# Patient Record
Sex: Female | Born: 1950 | Race: White | Hispanic: No | Marital: Married | Smoking: Never smoker
Health system: Southern US, Community
[De-identification: ages and names within clinical notes are randomized; demographics above are authoritative.]

## PROBLEM LIST (undated history)

## (undated) DIAGNOSIS — I10 Essential (primary) hypertension: Secondary | ICD-10-CM

## (undated) DIAGNOSIS — E039 Hypothyroidism, unspecified: Secondary | ICD-10-CM

## (undated) DIAGNOSIS — T7840XA Allergy, unspecified, initial encounter: Secondary | ICD-10-CM

## (undated) DIAGNOSIS — M8580 Other specified disorders of bone density and structure, unspecified site: Secondary | ICD-10-CM

## (undated) DIAGNOSIS — E7800 Pure hypercholesterolemia, unspecified: Secondary | ICD-10-CM

## (undated) DIAGNOSIS — Z98811 Dental restoration status: Secondary | ICD-10-CM

## (undated) DIAGNOSIS — M1990 Unspecified osteoarthritis, unspecified site: Secondary | ICD-10-CM

## (undated) DIAGNOSIS — H269 Unspecified cataract: Secondary | ICD-10-CM

## (undated) DIAGNOSIS — G4730 Sleep apnea, unspecified: Secondary | ICD-10-CM

## (undated) DIAGNOSIS — S63599A Other specified sprain of unspecified wrist, initial encounter: Secondary | ICD-10-CM

## (undated) HISTORY — PX: KNEE ARTHROSCOPY W/ MENISCAL REPAIR: SHX1877

## (undated) HISTORY — DX: Unspecified cataract: H26.9

## (undated) HISTORY — PX: NASAL SEPTUM SURGERY: SHX37

## (undated) HISTORY — PX: TUBAL LIGATION: SHX77

## (undated) HISTORY — PX: JOINT REPLACEMENT: SHX530

## (undated) HISTORY — DX: Other specified disorders of bone density and structure, unspecified site: M85.80

## (undated) HISTORY — DX: Sleep apnea, unspecified: G47.30

## (undated) HISTORY — DX: Allergy, unspecified, initial encounter: T78.40XA

---

## 1998-05-22 ENCOUNTER — Ambulatory Visit (HOSPITAL_COMMUNITY): Admission: RE | Admit: 1998-05-22 | Discharge: 1998-05-22 | Payer: Self-pay | Admitting: Urology

## 1998-05-22 ENCOUNTER — Encounter: Payer: Self-pay | Admitting: Urology

## 1998-05-24 ENCOUNTER — Emergency Department (HOSPITAL_COMMUNITY): Admission: EM | Admit: 1998-05-24 | Discharge: 1998-05-24 | Payer: Self-pay | Admitting: Emergency Medicine

## 1999-12-03 ENCOUNTER — Encounter: Admission: RE | Admit: 1999-12-03 | Discharge: 1999-12-03 | Payer: Self-pay | Admitting: Obstetrics and Gynecology

## 1999-12-03 ENCOUNTER — Encounter: Payer: Self-pay | Admitting: Obstetrics and Gynecology

## 1999-12-08 ENCOUNTER — Encounter: Admission: RE | Admit: 1999-12-08 | Discharge: 1999-12-08 | Payer: Self-pay | Admitting: Obstetrics and Gynecology

## 1999-12-08 ENCOUNTER — Encounter: Payer: Self-pay | Admitting: Obstetrics and Gynecology

## 2000-12-09 ENCOUNTER — Encounter: Admission: RE | Admit: 2000-12-09 | Discharge: 2000-12-09 | Payer: Self-pay | Admitting: Obstetrics and Gynecology

## 2000-12-09 ENCOUNTER — Encounter: Payer: Self-pay | Admitting: Obstetrics and Gynecology

## 2002-01-01 ENCOUNTER — Encounter: Payer: Self-pay | Admitting: Obstetrics and Gynecology

## 2002-01-01 ENCOUNTER — Encounter: Admission: RE | Admit: 2002-01-01 | Discharge: 2002-01-01 | Payer: Self-pay | Admitting: Obstetrics and Gynecology

## 2003-01-08 ENCOUNTER — Encounter: Admission: RE | Admit: 2003-01-08 | Discharge: 2003-01-08 | Payer: Self-pay | Admitting: Obstetrics and Gynecology

## 2003-01-08 ENCOUNTER — Encounter: Payer: Self-pay | Admitting: Obstetrics and Gynecology

## 2004-01-22 ENCOUNTER — Encounter: Admission: RE | Admit: 2004-01-22 | Discharge: 2004-01-22 | Payer: Self-pay | Admitting: Obstetrics and Gynecology

## 2005-03-15 ENCOUNTER — Encounter: Admission: RE | Admit: 2005-03-15 | Discharge: 2005-03-15 | Payer: Self-pay | Admitting: Internal Medicine

## 2005-09-08 ENCOUNTER — Encounter: Payer: Self-pay | Admitting: Emergency Medicine

## 2006-03-22 ENCOUNTER — Encounter: Admission: RE | Admit: 2006-03-22 | Discharge: 2006-03-22 | Payer: Self-pay | Admitting: Internal Medicine

## 2007-03-24 ENCOUNTER — Encounter: Admission: RE | Admit: 2007-03-24 | Discharge: 2007-03-24 | Payer: Self-pay | Admitting: Internal Medicine

## 2008-03-25 ENCOUNTER — Encounter: Admission: RE | Admit: 2008-03-25 | Discharge: 2008-03-25 | Payer: Self-pay | Admitting: Internal Medicine

## 2009-03-26 ENCOUNTER — Encounter: Admission: RE | Admit: 2009-03-26 | Discharge: 2009-03-26 | Payer: Self-pay | Admitting: Internal Medicine

## 2009-06-25 ENCOUNTER — Ambulatory Visit (HOSPITAL_COMMUNITY): Admission: RE | Admit: 2009-06-25 | Discharge: 2009-06-25 | Payer: Self-pay | Admitting: Internal Medicine

## 2010-04-13 ENCOUNTER — Encounter: Admission: RE | Admit: 2010-04-13 | Discharge: 2010-04-13 | Payer: Self-pay | Admitting: Internal Medicine

## 2010-09-13 ENCOUNTER — Ambulatory Visit (HOSPITAL_BASED_OUTPATIENT_CLINIC_OR_DEPARTMENT_OTHER): Payer: BC Managed Care – PPO | Attending: Otolaryngology

## 2010-09-13 DIAGNOSIS — R0989 Other specified symptoms and signs involving the circulatory and respiratory systems: Secondary | ICD-10-CM | POA: Insufficient documentation

## 2010-09-13 DIAGNOSIS — R259 Unspecified abnormal involuntary movements: Secondary | ICD-10-CM | POA: Insufficient documentation

## 2010-09-13 DIAGNOSIS — G4710 Hypersomnia, unspecified: Secondary | ICD-10-CM | POA: Insufficient documentation

## 2010-09-13 DIAGNOSIS — R0609 Other forms of dyspnea: Secondary | ICD-10-CM | POA: Insufficient documentation

## 2010-09-19 DIAGNOSIS — G4710 Hypersomnia, unspecified: Secondary | ICD-10-CM

## 2010-09-19 DIAGNOSIS — R0609 Other forms of dyspnea: Secondary | ICD-10-CM

## 2010-09-19 DIAGNOSIS — R259 Unspecified abnormal involuntary movements: Secondary | ICD-10-CM

## 2010-09-19 DIAGNOSIS — G4730 Sleep apnea, unspecified: Secondary | ICD-10-CM

## 2010-09-19 DIAGNOSIS — R0989 Other specified symptoms and signs involving the circulatory and respiratory systems: Secondary | ICD-10-CM

## 2010-09-19 NOTE — Procedures (Signed)
NAMEWYNONNA, FITZHENRY                  ACCOUNT NO.:  [id number]  MEDICAL RECORD NO.:  [id number]          PATIENT TYPE:  OUT  LOCATION:  SLEEP CENTER                 FACILITY:  [facility]  PHYSICIAN:  Prairie Stenberg D. Maple Hudson, MD, FCCP, FACPDATE OF BIRTH:  Mar 23[birth year] 1950  DATE OF STUDY:  09/13/2010                           NOCTURNAL POLYSOMNOGRAM  REFERRING PHYSICIAN:  DAVID L SHOEMAKER  INDICATION FOR STUDY:  Hypersomnia with sleep apnea.  EPWORTH SLEEPINESS SCORE:  13/24, BMI 28.3.  Weight 165 pounds, height 64 inches.  Neck 13 inches.  MEDICATIONS:  Home medications are charted and reviewed.  SLEEP ARCHITECTURE:  Total sleep time 288 minutes with sleep efficiency 70%.  Stage I was 22.2%, stage II 66.8%, stage III 2.1%, REM 8.9% of total sleep time.  Sleep latency 3.5 minutes.  REM latency 272 minutes. Awake after sleep onset 120 minutes.  Arousal index 24.6.  Bedtime Medications:  Flonase, calcium, aspirin.  RESPIRATORY DATA:  Apnea/hypopnea index (AHI) 4 per hour.  A total of 19 events was scored including one obstructive apneas, two central apneas, 16 hypopneas.  Events were non-supine and almost completely associated with REM (REM/AHI 37.6), RDI 20.2 per hour.  There were insufficient numbers of events to permit application of CPAP titration by split protocol on this study night.  OXYGEN DATA:  Moderately loud snoring with oxygen desaturation to a nadir of 86% and a mean oxygen saturation through the study of 95.7% on room air.  CARDIAC DATA:  Sinus rhythm with occasional PVC.  MOVEMENT-PARASOMNIA:  A total of 20 limb jerks were counted of which 6 were associated with arousal or awakening for periodic limb movement with arousal index of 1.3 per hour.  Bathroom x2.  IMPRESSIONS-RECOMMENDATIONS: 1. Sleep was fragmented by several moderately sustained nonspecific     awakenings.  If this is typical of her usual sleep pattern, then     management as insomnia may be helpful. 2. Occasional  respiratory events with sleep disturbance, within normal     limits, AHI 4 per hour (normal range 0-5 per hour).  Events were     exclusively associated with non-supine sleep and with REM and were     clustered during the single REM event around 3 a.m.  Moderately     loud snoring with oxygen desaturation to a nadir of 86% and a mean     oxygen saturation through the study of 95.7% on room air. 3. There were insufficient numbers of respiratory events to trigger     application of CPAP titration by split protocol on this study     night. 4. 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  A total of 20 limb     jerks were associated with 6 arousal events for periodic limb     movement with arousal index of 1.3 per hour.  Sleep was also     disturbed twice for bathroom trips.     Nahome Bublitz D. Maple Hudson, MD, FCCP, FACP Diplomate, Biomedical engineer of Sleep Medicine Electronically Signed    CDY/MEDQ  D:  09/19/2010 10:32:51  T:  09/19/2010 23:24:40  Job:  [id number]

## 2011-03-25 ENCOUNTER — Other Ambulatory Visit: Payer: Self-pay | Admitting: Nurse Practitioner

## 2011-03-25 DIAGNOSIS — Z1231 Encounter for screening mammogram for malignant neoplasm of breast: Secondary | ICD-10-CM

## 2011-04-13 ENCOUNTER — Other Ambulatory Visit: Payer: Self-pay | Admitting: Nurse Practitioner

## 2011-04-13 DIAGNOSIS — Z780 Asymptomatic menopausal state: Secondary | ICD-10-CM

## 2011-04-13 DIAGNOSIS — M8580 Other specified disorders of bone density and structure, unspecified site: Secondary | ICD-10-CM

## 2011-04-19 ENCOUNTER — Ambulatory Visit
Admission: RE | Admit: 2011-04-19 | Discharge: 2011-04-19 | Disposition: A | Discharge status: Home / Self Care | Payer: BC Managed Care – PPO | Source: Ambulatory Visit | Attending: Nurse Practitioner | Admitting: Nurse Practitioner

## 2011-04-19 DIAGNOSIS — Z1231 Encounter for screening mammogram for malignant neoplasm of breast: Secondary | ICD-10-CM

## 2011-05-20 ENCOUNTER — Ambulatory Visit
Admission: RE | Admit: 2011-05-20 | Discharge: 2011-05-20 | Disposition: A | Discharge status: Home / Self Care | Payer: BC Managed Care – PPO | Source: Ambulatory Visit | Attending: Nurse Practitioner | Admitting: Nurse Practitioner

## 2011-05-20 DIAGNOSIS — Z780 Asymptomatic menopausal state: Secondary | ICD-10-CM

## 2011-05-20 DIAGNOSIS — M8580 Other specified disorders of bone density and structure, unspecified site: Secondary | ICD-10-CM

## 2012-03-24 ENCOUNTER — Other Ambulatory Visit: Payer: Self-pay | Admitting: Nurse Practitioner

## 2012-03-24 DIAGNOSIS — Z1231 Encounter for screening mammogram for malignant neoplasm of breast: Secondary | ICD-10-CM

## 2012-05-05 ENCOUNTER — Ambulatory Visit
Admission: RE | Admit: 2012-05-05 | Discharge: 2012-05-05 | Disposition: A | Discharge status: Home / Self Care | Payer: BC Managed Care – PPO | Source: Ambulatory Visit | Attending: Nurse Practitioner | Admitting: Nurse Practitioner

## 2012-05-05 DIAGNOSIS — Z1231 Encounter for screening mammogram for malignant neoplasm of breast: Secondary | ICD-10-CM

## 2013-03-26 ENCOUNTER — Ambulatory Visit (INDEPENDENT_AMBULATORY_CARE_PROVIDER_SITE_OTHER): Payer: BC Managed Care – PPO

## 2013-03-26 ENCOUNTER — Ambulatory Visit (INDEPENDENT_AMBULATORY_CARE_PROVIDER_SITE_OTHER): Payer: BC Managed Care – PPO | Admitting: Podiatry

## 2013-03-26 ENCOUNTER — Encounter: Payer: Self-pay | Admitting: Podiatry

## 2013-03-26 VITALS — BP 165/78 | HR 88 | Resp 12 | Ht 64.0 in | Wt 178.0 lb

## 2013-03-26 DIAGNOSIS — M79609 Pain in unspecified limb: Secondary | ICD-10-CM

## 2013-03-26 DIAGNOSIS — M79671 Pain in right foot: Secondary | ICD-10-CM

## 2013-03-26 DIAGNOSIS — M722 Plantar fascial fibromatosis: Secondary | ICD-10-CM

## 2013-03-26 MED ORDER — IBUPROFEN 800 MG PO TABS: 800.0000 mg | ORAL_TABLET | Freq: Three times a day (TID) | ORAL | Status: DC | PRN

## 2013-03-26 MED ORDER — TRIAMCINOLONE ACETONIDE 10 MG/ML IJ SUSP
10.0000 mg | Freq: Once | INTRAMUSCULAR | Status: AC
  Administered 2013-03-26: 10 mg

## 2013-03-26 NOTE — Progress Notes (Signed)
  Subjective:    Patient ID: Jean Baldwin, female    DOB: Jul 22, 1950, 62 y.o.   MRN: [id number]  HPI Comments: '' RT FOOT BOTTOM OF THE HEEL IS SORE''  She has tried over-the-counter NSAIDs shoe changes and wearing her existing hardware orthotics in the symptoms are persisting. She relates having right heel pain for many years ago and has been existing rigid orthotic which he is attempted to wear hours uncomfortable because she says it's too hard.   Review of Systems  Musculoskeletal: Positive for gait problem and joint swelling.  All other systems reviewed and are negative.       Objective:   Physical Exam Orientated x50 62 year old white female.  Vascular: The DP and PT pulses are two over four bilaterally.  Neurological: Knee and ankle reflexes are equal and reactive bilaterally  Dermatological: Texture and turgor within normal limits  Musculoskeletal: High arch contour noted with a antalgia gait favoring the right foot. The medial plantar right heel is tender to palpation without a palpable lesions. This area duplicates her area of discomfort.        Assessment & Plan:   Assessment: Plantar fasciitis right  Plan: The skin is prepped with alcohol and Betadine and 10 mg of Kenalog mixed with 10 mg of plain Xylocaine and 2.5 mg of plain Marcaine injected inferior right heel for Kenalog injection #1.  Shoes and stretching discussed. DC over-the-counter NSAIDs and begin ibuprofen 800 mg #90, tid. She will attempt to wear orthotics in the future as tolerated. Reappoint at patient's request.

## 2013-03-26 NOTE — Patient Instructions (Signed)

## 2013-04-12 ENCOUNTER — Other Ambulatory Visit: Payer: Self-pay

## 2013-04-12 DIAGNOSIS — Z1231 Encounter for screening mammogram for malignant neoplasm of breast: Secondary | ICD-10-CM

## 2013-05-11 ENCOUNTER — Ambulatory Visit
Admission: RE | Admit: 2013-05-11 | Discharge: 2013-05-11 | Disposition: A | Discharge status: Home / Self Care | Payer: BC Managed Care – PPO | Source: Ambulatory Visit

## 2013-05-11 ENCOUNTER — Ambulatory Visit: Payer: BC Managed Care – PPO

## 2013-05-11 DIAGNOSIS — Z1231 Encounter for screening mammogram for malignant neoplasm of breast: Secondary | ICD-10-CM

## 2014-04-17 ENCOUNTER — Other Ambulatory Visit: Payer: Self-pay

## 2014-04-17 DIAGNOSIS — Z1231 Encounter for screening mammogram for malignant neoplasm of breast: Secondary | ICD-10-CM

## 2014-05-13 ENCOUNTER — Ambulatory Visit: Payer: BC Managed Care – PPO

## 2014-05-15 ENCOUNTER — Other Ambulatory Visit: Payer: Self-pay

## 2014-05-15 ENCOUNTER — Other Ambulatory Visit: Payer: Self-pay | Admitting: Nurse Practitioner

## 2014-05-15 DIAGNOSIS — Z780 Asymptomatic menopausal state: Secondary | ICD-10-CM

## 2014-05-15 DIAGNOSIS — Z1231 Encounter for screening mammogram for malignant neoplasm of breast: Secondary | ICD-10-CM

## 2014-05-22 ENCOUNTER — Ambulatory Visit
Admission: RE | Admit: 2014-05-22 | Discharge: 2014-05-22 | Disposition: A | Discharge status: Home / Self Care | Payer: BC Managed Care – PPO | Source: Ambulatory Visit

## 2014-05-22 ENCOUNTER — Ambulatory Visit
Admission: RE | Admit: 2014-05-22 | Discharge: 2014-05-22 | Disposition: A | Discharge status: Home / Self Care | Payer: BC Managed Care – PPO | Source: Ambulatory Visit | Attending: Nurse Practitioner | Admitting: Nurse Practitioner

## 2014-05-22 DIAGNOSIS — Z780 Asymptomatic menopausal state: Secondary | ICD-10-CM

## 2014-05-22 DIAGNOSIS — Z1231 Encounter for screening mammogram for malignant neoplasm of breast: Secondary | ICD-10-CM

## 2014-05-24 ENCOUNTER — Other Ambulatory Visit: Payer: Self-pay | Admitting: Nurse Practitioner

## 2014-05-24 DIAGNOSIS — R928 Other abnormal and inconclusive findings on diagnostic imaging of breast: Secondary | ICD-10-CM

## 2014-06-05 ENCOUNTER — Ambulatory Visit
Admission: RE | Admit: 2014-06-05 | Discharge: 2014-06-05 | Disposition: A | Discharge status: Home / Self Care | Payer: BC Managed Care – PPO | Source: Ambulatory Visit | Attending: Nurse Practitioner | Admitting: Nurse Practitioner

## 2014-06-05 DIAGNOSIS — R928 Other abnormal and inconclusive findings on diagnostic imaging of breast: Secondary | ICD-10-CM

## 2014-09-24 ENCOUNTER — Other Ambulatory Visit: Payer: Self-pay | Admitting: Nurse Practitioner

## 2014-09-24 ENCOUNTER — Other Ambulatory Visit (HOSPITAL_COMMUNITY): Payer: Self-pay | Admitting: Nurse Practitioner

## 2014-09-24 DIAGNOSIS — R0602 Shortness of breath: Secondary | ICD-10-CM

## 2014-10-17 ENCOUNTER — Telehealth (HOSPITAL_COMMUNITY): Payer: Self-pay

## 2014-10-17 NOTE — Telephone Encounter (Signed)
Encounter complete. 

## 2014-10-18 ENCOUNTER — Telehealth (HOSPITAL_COMMUNITY): Payer: Self-pay

## 2014-10-18 NOTE — Telephone Encounter (Signed)
Encounter complete. 

## 2014-10-22 ENCOUNTER — Ambulatory Visit (HOSPITAL_COMMUNITY)
Admission: RE | Admit: 2014-10-22 | Discharge: 2014-10-22 | Disposition: A | Discharge status: Home / Self Care | Payer: BC Managed Care – PPO | Source: Ambulatory Visit | Attending: Internal Medicine | Admitting: Internal Medicine

## 2014-10-22 DIAGNOSIS — R0602 Shortness of breath: Secondary | ICD-10-CM | POA: Insufficient documentation

## 2014-10-22 LAB — EXERCISE TOLERANCE TEST
CHL RATE OF PERCEIVED EXERTION: 15
CSEPED: 7 min
Estimated workload: 8.5 METS
MPHR: 156 {beats}/min
Peak HR: 162 {beats}/min
Percent HR: 103 %
Rest HR: 91 {beats}/min

## 2015-04-29 ENCOUNTER — Ambulatory Visit
Admission: RE | Admit: 2015-04-29 | Discharge: 2015-04-29 | Disposition: A | Discharge status: Home / Self Care | Payer: BC Managed Care – PPO | Source: Ambulatory Visit | Attending: Nurse Practitioner | Admitting: Nurse Practitioner

## 2015-04-29 ENCOUNTER — Other Ambulatory Visit: Payer: Self-pay | Admitting: Nurse Practitioner

## 2015-04-29 DIAGNOSIS — M25562 Pain in left knee: Secondary | ICD-10-CM

## 2015-04-29 DIAGNOSIS — M25511 Pain in right shoulder: Secondary | ICD-10-CM

## 2015-05-16 ENCOUNTER — Other Ambulatory Visit: Payer: Self-pay | Admitting: Nurse Practitioner

## 2015-05-16 ENCOUNTER — Other Ambulatory Visit: Payer: Self-pay

## 2015-05-16 DIAGNOSIS — Z1231 Encounter for screening mammogram for malignant neoplasm of breast: Secondary | ICD-10-CM

## 2015-06-05 ENCOUNTER — Ambulatory Visit
Admission: RE | Admit: 2015-06-05 | Discharge: 2015-06-05 | Disposition: A | Discharge status: Home / Self Care | Payer: BC Managed Care – PPO | Source: Ambulatory Visit

## 2015-06-05 DIAGNOSIS — Z1231 Encounter for screening mammogram for malignant neoplasm of breast: Secondary | ICD-10-CM

## 2016-04-19 ENCOUNTER — Other Ambulatory Visit: Payer: Self-pay | Admitting: Family Medicine

## 2016-04-19 DIAGNOSIS — E2839 Other primary ovarian failure: Secondary | ICD-10-CM

## 2016-04-29 ENCOUNTER — Ambulatory Visit
Admission: RE | Admit: 2016-04-29 | Discharge: 2016-04-29 | Disposition: A | Discharge status: Home / Self Care | Payer: BC Managed Care – PPO | Source: Ambulatory Visit | Attending: Family Medicine | Admitting: Family Medicine

## 2016-04-29 DIAGNOSIS — E2839 Other primary ovarian failure: Secondary | ICD-10-CM

## 2016-05-11 ENCOUNTER — Other Ambulatory Visit: Payer: Self-pay | Admitting: Family Medicine

## 2016-05-11 DIAGNOSIS — Z1231 Encounter for screening mammogram for malignant neoplasm of breast: Secondary | ICD-10-CM

## 2016-06-07 ENCOUNTER — Ambulatory Visit
Admission: RE | Admit: 2016-06-07 | Discharge: 2016-06-07 | Disposition: A | Discharge status: Home / Self Care | Payer: BC Managed Care – PPO | Source: Ambulatory Visit | Attending: Family Medicine | Admitting: Family Medicine

## 2016-06-07 DIAGNOSIS — Z1231 Encounter for screening mammogram for malignant neoplasm of breast: Secondary | ICD-10-CM

## 2016-07-16 ENCOUNTER — Other Ambulatory Visit: Payer: Self-pay | Admitting: Orthopedic Surgery

## 2016-07-16 DIAGNOSIS — S52611K Displaced fracture of right ulna styloid process, subsequent encounter for closed fracture with nonunion: Secondary | ICD-10-CM

## 2016-08-02 ENCOUNTER — Ambulatory Visit
Admission: RE | Admit: 2016-08-02 | Discharge: 2016-08-02 | Disposition: A | Discharge status: Home / Self Care | Payer: BC Managed Care – PPO | Source: Ambulatory Visit | Attending: Orthopedic Surgery | Admitting: Orthopedic Surgery

## 2016-08-02 DIAGNOSIS — S52611K Displaced fracture of right ulna styloid process, subsequent encounter for closed fracture with nonunion: Secondary | ICD-10-CM

## 2016-08-02 MED ORDER — IOPAMIDOL (ISOVUE-M 200) INJECTION 41%
1.5000 mL | Freq: Once | INTRAMUSCULAR | Status: AC
  Administered 2016-08-02: 1.5 mL via INTRA_ARTICULAR

## 2016-08-11 DIAGNOSIS — S52611K Displaced fracture of right ulna styloid process, subsequent encounter for closed fracture with nonunion: Secondary | ICD-10-CM | POA: Insufficient documentation

## 2016-08-11 DIAGNOSIS — S6981XA Other specified injuries of right wrist, hand and finger(s), initial encounter: Secondary | ICD-10-CM | POA: Insufficient documentation

## 2016-08-11 DIAGNOSIS — M1811 Unilateral primary osteoarthritis of first carpometacarpal joint, right hand: Secondary | ICD-10-CM | POA: Insufficient documentation

## 2017-03-04 ENCOUNTER — Other Ambulatory Visit: Payer: Self-pay | Admitting: Orthopedic Surgery

## 2017-03-10 DIAGNOSIS — S63599A Other specified sprain of unspecified wrist, initial encounter: Secondary | ICD-10-CM

## 2017-03-10 HISTORY — DX: Other specified sprain of unspecified wrist, initial encounter: S63.599A

## 2017-04-07 ENCOUNTER — Other Ambulatory Visit: Payer: Self-pay

## 2017-04-07 ENCOUNTER — Encounter (HOSPITAL_BASED_OUTPATIENT_CLINIC_OR_DEPARTMENT_OTHER): Payer: Self-pay | Admitting: *Deleted

## 2017-04-07 ENCOUNTER — Encounter (HOSPITAL_BASED_OUTPATIENT_CLINIC_OR_DEPARTMENT_OTHER)
Admission: RE | Admit: 2017-04-07 | Discharge: 2017-04-07 | Disposition: A | Discharge status: Home / Self Care | Payer: Medicare Other | Source: Ambulatory Visit | Attending: Orthopedic Surgery | Admitting: Orthopedic Surgery

## 2017-04-07 DIAGNOSIS — I10 Essential (primary) hypertension: Secondary | ICD-10-CM | POA: Insufficient documentation

## 2017-04-07 DIAGNOSIS — Z01810 Encounter for preprocedural cardiovascular examination: Secondary | ICD-10-CM | POA: Diagnosis present

## 2017-04-07 NOTE — Pre-Procedure Instructions (Signed)
To come for EKG 

## 2017-04-12 ENCOUNTER — Encounter (HOSPITAL_BASED_OUTPATIENT_CLINIC_OR_DEPARTMENT_OTHER)
Admission: RE | Disposition: A | Discharge status: Home / Self Care | Payer: Self-pay | Source: Ambulatory Visit | Attending: Orthopedic Surgery

## 2017-04-12 ENCOUNTER — Ambulatory Visit (HOSPITAL_BASED_OUTPATIENT_CLINIC_OR_DEPARTMENT_OTHER)
Admission: RE | Admit: 2017-04-12 | Discharge: 2017-04-12 | Disposition: A | Discharge status: Home / Self Care | Payer: Medicare Other | Source: Ambulatory Visit | Attending: Orthopedic Surgery | Admitting: Orthopedic Surgery

## 2017-04-12 ENCOUNTER — Ambulatory Visit (HOSPITAL_BASED_OUTPATIENT_CLINIC_OR_DEPARTMENT_OTHER): Payer: Medicare Other | Admitting: Anesthesiology

## 2017-04-12 ENCOUNTER — Other Ambulatory Visit: Payer: Self-pay

## 2017-04-12 ENCOUNTER — Encounter (HOSPITAL_BASED_OUTPATIENT_CLINIC_OR_DEPARTMENT_OTHER): Payer: Self-pay | Admitting: *Deleted

## 2017-04-12 DIAGNOSIS — X58XXXA Exposure to other specified factors, initial encounter: Secondary | ICD-10-CM | POA: Insufficient documentation

## 2017-04-12 DIAGNOSIS — E039 Hypothyroidism, unspecified: Secondary | ICD-10-CM | POA: Insufficient documentation

## 2017-04-12 DIAGNOSIS — Z882 Allergy status to sulfonamides status: Secondary | ICD-10-CM | POA: Insufficient documentation

## 2017-04-12 DIAGNOSIS — I10 Essential (primary) hypertension: Secondary | ICD-10-CM | POA: Diagnosis not present

## 2017-04-12 DIAGNOSIS — Z7982 Long term (current) use of aspirin: Secondary | ICD-10-CM | POA: Diagnosis not present

## 2017-04-12 DIAGNOSIS — M170 Bilateral primary osteoarthritis of knee: Secondary | ICD-10-CM | POA: Insufficient documentation

## 2017-04-12 DIAGNOSIS — Z885 Allergy status to narcotic agent status: Secondary | ICD-10-CM | POA: Insufficient documentation

## 2017-04-12 DIAGNOSIS — M1811 Unilateral primary osteoarthritis of first carpometacarpal joint, right hand: Secondary | ICD-10-CM | POA: Diagnosis not present

## 2017-04-12 DIAGNOSIS — E7800 Pure hypercholesterolemia, unspecified: Secondary | ICD-10-CM | POA: Insufficient documentation

## 2017-04-12 DIAGNOSIS — S6981XA Other specified injuries of right wrist, hand and finger(s), initial encounter: Secondary | ICD-10-CM | POA: Insufficient documentation

## 2017-04-12 DIAGNOSIS — Z79899 Other long term (current) drug therapy: Secondary | ICD-10-CM | POA: Diagnosis not present

## 2017-04-12 HISTORY — PX: WRIST ARTHROSCOPY WITH DEBRIDEMENT: SHX6194

## 2017-04-12 HISTORY — DX: Unspecified osteoarthritis, unspecified site: M19.90

## 2017-04-12 HISTORY — DX: Hypothyroidism, unspecified: E03.9

## 2017-04-12 HISTORY — DX: Pure hypercholesterolemia, unspecified: E78.00

## 2017-04-12 HISTORY — DX: Essential (primary) hypertension: I10

## 2017-04-12 HISTORY — DX: Dental restoration status: Z98.811

## 2017-04-12 HISTORY — DX: Other specified sprain of unspecified wrist, initial encounter: S63.599A

## 2017-04-12 LAB — POCT HEMOGLOBIN-HEMACUE: Hemoglobin: 11.9 g/dL — ABNORMAL LOW (ref 12.0–15.0)

## 2017-04-12 SURGERY — WRIST ARTHROSCOPY WITH DEBRIDEMENT
Anesthesia: General | Site: Wrist | Laterality: Right

## 2017-04-12 MED ORDER — DEXAMETHASONE SODIUM PHOSPHATE 10 MG/ML IJ SOLN
INTRAMUSCULAR | Status: DC | PRN
  Administered 2017-04-12: 10 mg via INTRAVENOUS

## 2017-04-12 MED ORDER — PROPOFOL 10 MG/ML IV BOLUS
INTRAVENOUS | Status: AC
  Filled 2017-04-12: qty 20

## 2017-04-12 MED ORDER — EPHEDRINE 5 MG/ML INJ
INTRAVENOUS | Status: AC
  Filled 2017-04-12: qty 10

## 2017-04-12 MED ORDER — CEFAZOLIN SODIUM-DEXTROSE 2-4 GM/100ML-% IV SOLN
2.0000 g | INTRAVENOUS | Status: AC
  Administered 2017-04-12: 2 g via INTRAVENOUS

## 2017-04-12 MED ORDER — MIDAZOLAM HCL 2 MG/2ML IJ SOLN
1.0000 mg | INTRAMUSCULAR | Status: DC | PRN
  Administered 2017-04-12 (×2): 1 mg via INTRAVENOUS

## 2017-04-12 MED ORDER — MIDAZOLAM HCL 2 MG/2ML IJ SOLN
INTRAMUSCULAR | Status: AC
  Filled 2017-04-12: qty 2

## 2017-04-12 MED ORDER — LACTATED RINGERS IV SOLN
INTRAVENOUS | Status: DC
  Administered 2017-04-12 (×2): via INTRAVENOUS

## 2017-04-12 MED ORDER — SUCCINYLCHOLINE CHLORIDE 200 MG/10ML IV SOSY
PREFILLED_SYRINGE | INTRAVENOUS | Status: AC
  Filled 2017-04-12: qty 10

## 2017-04-12 MED ORDER — SCOPOLAMINE 1 MG/3DAYS TD PT72
MEDICATED_PATCH | TRANSDERMAL | Status: AC
  Filled 2017-04-12: qty 1

## 2017-04-12 MED ORDER — FENTANYL CITRATE (PF) 100 MCG/2ML IJ SOLN
INTRAMUSCULAR | Status: AC
  Filled 2017-04-12: qty 2

## 2017-04-12 MED ORDER — DEXAMETHASONE SODIUM PHOSPHATE 10 MG/ML IJ SOLN
INTRAMUSCULAR | Status: AC
  Filled 2017-04-12: qty 1

## 2017-04-12 MED ORDER — PROMETHAZINE HCL 25 MG/ML IJ SOLN: 6.2500 mg | INTRAMUSCULAR | Status: DC | PRN

## 2017-04-12 MED ORDER — FENTANYL CITRATE (PF) 100 MCG/2ML IJ SOLN: 25.0000 ug | INTRAMUSCULAR | Status: DC | PRN

## 2017-04-12 MED ORDER — FENTANYL CITRATE (PF) 100 MCG/2ML IJ SOLN
50.0000 ug | INTRAMUSCULAR | Status: AC | PRN
  Administered 2017-04-12 (×2): 25 ug via INTRAVENOUS
  Administered 2017-04-12 (×2): 50 ug via INTRAVENOUS

## 2017-04-12 MED ORDER — SCOPOLAMINE 1 MG/3DAYS TD PT72
1.0000 | MEDICATED_PATCH | Freq: Once | TRANSDERMAL | Status: DC | PRN
  Administered 2017-04-12: 1.5 mg via TRANSDERMAL

## 2017-04-12 MED ORDER — PROPOFOL 10 MG/ML IV BOLUS
INTRAVENOUS | Status: DC | PRN
  Administered 2017-04-12: 200 mg via INTRAVENOUS

## 2017-04-12 MED ORDER — CEFAZOLIN SODIUM-DEXTROSE 2-4 GM/100ML-% IV SOLN
INTRAVENOUS | Status: AC
  Filled 2017-04-12: qty 100

## 2017-04-12 MED ORDER — ONDANSETRON HCL 4 MG/2ML IJ SOLN
INTRAMUSCULAR | Status: DC | PRN
  Administered 2017-04-12: 4 mg via INTRAVENOUS

## 2017-04-12 MED ORDER — FENTANYL CITRATE (PF) 100 MCG/2ML IJ SOLN
INTRAMUSCULAR | Status: AC
Start: 2017-04-12 — End: 2017-04-12
  Filled 2017-04-12: qty 2

## 2017-04-12 MED ORDER — PROPOFOL 500 MG/50ML IV EMUL
INTRAVENOUS | Status: AC
  Filled 2017-04-12: qty 50

## 2017-04-12 MED ORDER — ONDANSETRON HCL 4 MG/2ML IJ SOLN
INTRAMUSCULAR | Status: AC
  Filled 2017-04-12: qty 2

## 2017-04-12 MED ORDER — CHLORHEXIDINE GLUCONATE 4 % EX LIQD: 60.0000 mL | Freq: Once | CUTANEOUS | Status: DC

## 2017-04-12 MED ORDER — TRAMADOL HCL 50 MG PO TABS: 50.0000 mg | ORAL_TABLET | Freq: Four times a day (QID) | ORAL | 0 refills | Status: DC | PRN

## 2017-04-12 MED ORDER — BUPIVACAINE-EPINEPHRINE (PF) 0.5% -1:200000 IJ SOLN
INTRAMUSCULAR | Status: DC | PRN
  Administered 2017-04-12: 30 mL via PERINEURAL

## 2017-04-12 MED ORDER — LIDOCAINE 2% (20 MG/ML) 5 ML SYRINGE
INTRAMUSCULAR | Status: AC
  Filled 2017-04-12: qty 5

## 2017-04-12 MED ORDER — PHENYLEPHRINE 40 MCG/ML (10ML) SYRINGE FOR IV PUSH (FOR BLOOD PRESSURE SUPPORT)
PREFILLED_SYRINGE | INTRAVENOUS | Status: AC
  Filled 2017-04-12: qty 10

## 2017-04-12 SURGICAL SUPPLY — 77 items
BLADE CUDA 2.0 (BLADE) IMPLANT
BLADE EAR TYMPAN 2.5 60D BEAV (BLADE) IMPLANT
BLADE MINI RND TIP GREEN BEAV (BLADE) IMPLANT
BLADE SURG 15 STRL LF DISP TIS (BLADE) ×1 IMPLANT
BLADE SURG 15 STRL SS (BLADE) ×2
BNDG CMPR 9X4 STRL LF SNTH (GAUZE/BANDAGES/DRESSINGS)
BNDG COHESIVE 3X5 TAN STRL LF (GAUZE/BANDAGES/DRESSINGS) ×2 IMPLANT
BNDG ESMARK 4X9 LF (GAUZE/BANDAGES/DRESSINGS) IMPLANT
BNDG GAUZE ELAST 4 BULKY (GAUZE/BANDAGES/DRESSINGS) ×2 IMPLANT
BUR CUDA 2.9 (BURR) IMPLANT
BUR FULL RADIUS 2.0 (BURR) ×1 IMPLANT
BUR FULL RADIUS 2.9 (BURR) IMPLANT
BUR GATOR 2.9 (BURR) ×1 IMPLANT
BUR SPHERICAL 2.9 (BURR) IMPLANT
CANISTER SUCT 1200ML W/VALVE (MISCELLANEOUS) ×1 IMPLANT
CHLORAPREP W/TINT 26ML (MISCELLANEOUS) ×2 IMPLANT
CORD BIPOLAR FORCEPS 12FT (ELECTRODE) IMPLANT
COVER BACK TABLE 60X90IN (DRAPES) ×2 IMPLANT
COVER MAYO STAND STRL (DRAPES) ×2 IMPLANT
CUFF TOURNIQUET SINGLE 18IN (TOURNIQUET CUFF) ×1 IMPLANT
DRAPE EXTREMITY T 121X128X90 (DRAPE) ×2 IMPLANT
DRAPE IMP U-DRAPE 54X76 (DRAPES) ×2 IMPLANT
DRAPE OEC MINIVIEW 54X84 (DRAPES) IMPLANT
DRAPE SURG 17X23 STRL (DRAPES) ×2 IMPLANT
ELECT SMALL JOINT 90D BASC (ELECTRODE) IMPLANT
GAUZE SPONGE 4X4 12PLY STRL (GAUZE/BANDAGES/DRESSINGS) ×2 IMPLANT
GAUZE XEROFORM 1X8 LF (GAUZE/BANDAGES/DRESSINGS) ×2 IMPLANT
GLOVE BIO SURGEON STRL SZ 6.5 (GLOVE) ×1 IMPLANT
GLOVE BIOGEL PI IND STRL 7.0 (GLOVE) IMPLANT
GLOVE BIOGEL PI IND STRL 8.5 (GLOVE) ×1 IMPLANT
GLOVE BIOGEL PI INDICATOR 7.0 (GLOVE) ×2
GLOVE BIOGEL PI INDICATOR 8.5 (GLOVE) ×1
GLOVE SURG ORTHO 8.0 STRL STRW (GLOVE) ×2 IMPLANT
GOWN STRL REUS W/ TWL LRG LVL3 (GOWN DISPOSABLE) ×1 IMPLANT
GOWN STRL REUS W/TWL LRG LVL3 (GOWN DISPOSABLE) ×2
GOWN STRL REUS W/TWL XL LVL3 (GOWN DISPOSABLE) ×2 IMPLANT
IV NS IRRIG 3000ML ARTHROMATIC (IV SOLUTION) ×2 IMPLANT
IV SET EXT 30 76VOL 4 MALE LL (IV SETS) ×2 IMPLANT
MANIFOLD NEPTUNE II (INSTRUMENTS) IMPLANT
NDL EPIDURAL TUOHY 20GX3.5 (NEEDLE) IMPLANT
NDL SAFETY ECLIPSE 18X1.5 (NEEDLE) ×3 IMPLANT
NDL SPNL 18GX3.5 QUINCKE PK (NEEDLE) IMPLANT
NEEDLE HYPO 18GX1.5 SHARP (NEEDLE) ×2
NEEDLE HYPO 22GX1.5 SAFETY (NEEDLE) ×2 IMPLANT
NEEDLE SPNL 18GX3.5 QUINCKE PK (NEEDLE) IMPLANT
NEEDLE TUOHY 20GX3.5 (NEEDLE) IMPLANT
NS IRRIG 1000ML POUR BTL (IV SOLUTION) IMPLANT
PACK BASIN DAY SURGERY FS (CUSTOM PROCEDURE TRAY) ×2 IMPLANT
PAD CAST 3X4 CTTN HI CHSV (CAST SUPPLIES) ×1 IMPLANT
PADDING CAST ABS 3INX4YD NS (CAST SUPPLIES) ×1
PADDING CAST ABS 4INX4YD NS (CAST SUPPLIES) ×1
PADDING CAST ABS COTTON 3X4 (CAST SUPPLIES) ×1 IMPLANT
PADDING CAST ABS COTTON 4X4 ST (CAST SUPPLIES) ×1 IMPLANT
PADDING CAST COTTON 3X4 STRL (CAST SUPPLIES) ×2
ROUTER HOODED VORTEX 2.9MM (BLADE) IMPLANT
SET ARTHROSCOPY TUBING (MISCELLANEOUS) ×2
SET ARTHROSCOPY TUBING LN (MISCELLANEOUS) IMPLANT
SET SM JOINT TUBING/CANN (CANNULA) IMPLANT
SLEEVE SCD COMPRESS KNEE MED (MISCELLANEOUS) ×1 IMPLANT
SPLINT PLASTER CAST XFAST 3X15 (CAST SUPPLIES) IMPLANT
SPLINT PLASTER XTRA FASTSET 3X (CAST SUPPLIES)
STOCKINETTE 4X48 STRL (DRAPES) ×2 IMPLANT
SUCTION FRAZIER HANDLE 10FR (MISCELLANEOUS)
SUCTION TUBE FRAZIER 10FR DISP (MISCELLANEOUS) IMPLANT
SUT ETHILON 4 0 PS 2 18 (SUTURE) ×2 IMPLANT
SUT MERSILENE 4 0 P 3 (SUTURE) IMPLANT
SUT PDS AB 2-0 CT2 27 (SUTURE) IMPLANT
SUT STEEL 4 0 (SUTURE) IMPLANT
SUT VIC AB 2-0 PS2 27 (SUTURE) IMPLANT
SUT VICRYL 4-0 PS2 18IN ABS (SUTURE) IMPLANT
SYR BULB 3OZ (MISCELLANEOUS) ×1 IMPLANT
SYR CONTROL 10ML LL (SYRINGE) ×2 IMPLANT
TOWEL OR NON WOVEN STRL DISP B (DISPOSABLE) ×2 IMPLANT
TUBE CONNECTING 20X1/4 (TUBING) ×1 IMPLANT
UNDERPAD 30X30 (UNDERPADS AND DIAPERS) ×2 IMPLANT
WAND SHORT BEVEL W/CORD (SURGICAL WAND) IMPLANT
WATER STERILE IRR 1000ML POUR (IV SOLUTION) ×2 IMPLANT

## 2017-04-12 NOTE — Anesthesia Procedure Notes (Addendum)
Procedure Name: LMA Insertion Date/Time: 04/12/2017 11:01 AM Performed by: Ronnette HilaPayne, Duc Crocket D, CRNA Pre-anesthesia Checklist: Patient identified, Emergency Drugs available, Suction available and Patient being monitored Patient Re-evaluated:Patient Re-evaluated prior to induction Oxygen Delivery Method: Circle system utilized Preoxygenation: Pre-oxygenation with 100% oxygen Induction Type: IV induction Ventilation: Mask ventilation without difficulty LMA: LMA inserted LMA Size: 4.0 Number of attempts: 1 Airway Equipment and Method: Bite block Placement Confirmation: positive ETCO2 Tube secured with: Tape Dental Injury: Teeth and Oropharynx as per pre-operative assessment

## 2017-04-12 NOTE — Anesthesia Procedure Notes (Signed)
Anesthesia Regional Block: Supraclavicular block   Pre-Anesthetic Checklist: ,, timeout performed, Correct Patient, Correct Site, Correct Laterality, Correct Procedure, Correct Position, site marked, Risks and benefits discussed,  Surgical consent,  Pre-op evaluation,  At surgeon's request and post-op pain management  Laterality: Right  Prep: chloraprep       Needles:  Injection technique: Single-shot  Needle Type: Echogenic Stimulator Needle     Needle Length: 9cm  Needle Gauge: 21     Additional Needles:   Procedures:, nerve stimulator,,, ultrasound used (permanent image in chart),,,,   Nerve Stimulator or Paresthesia:  Response: wrist flexion/ extension, biceps and deltoid, 0.5 mA,   Additional Responses:   Narrative:  Start time: 04/12/2017 10:27 AM End time: 04/12/2017 10:34 AM Injection made incrementally with aspirations every 5 mL.  Performed by: Personally  Anesthesiologist: Marcene DuosFitzgerald, Jaxyn Mestas, MD

## 2017-04-12 NOTE — Anesthesia Preprocedure Evaluation (Signed)
Anesthesia Evaluation  Patient identified by MRN, date of birth, ID band Patient awake    Reviewed: Allergy & Precautions, NPO status , Patient's Chart, lab work & pertinent test results  Airway Mallampati: II  TM Distance: >3 FB Neck ROM: Full    Dental  (+) Dental Advisory Given   Pulmonary neg pulmonary ROS,    breath sounds clear to auscultation       Cardiovascular hypertension, Pt. on medications  Rhythm:Regular Rate:Normal     Neuro/Psych negative neurological ROS     GI/Hepatic negative GI ROS, Neg liver ROS,   Endo/Other  Hypothyroidism   Renal/GU negative Renal ROS     Musculoskeletal  (+) Arthritis ,   Abdominal   Peds  Hematology negative hematology ROS (+)   Anesthesia Other Findings   Reproductive/Obstetrics                             Anesthesia Physical Anesthesia Plan  ASA: II  Anesthesia Plan: General   Post-op Pain Management:  Regional for Post-op pain   Induction: Intravenous  PONV Risk Score and Plan: 3 and Dexamethasone, Ondansetron, Treatment may vary due to age or medical condition and Midazolam  Airway Management Planned: Oral ETT and LMA  Additional Equipment:   Intra-op Plan:   Post-operative Plan: Extubation in OR  Informed Consent: I have reviewed the patients History and Physical, chart, labs and discussed the procedure including the risks, benefits and alternatives for the proposed anesthesia with the patient or authorized representative who has indicated his/her understanding and acceptance.   Dental advisory given  Plan Discussed with: CRNA  Anesthesia Plan Comments:         Anesthesia Quick Evaluation

## 2017-04-12 NOTE — Transfer of Care (Signed)
Immediate Anesthesia Transfer of Care Note  Patient: Jean Baldwin  Procedure(s) Performed: RIGHT WRIST ARTHROSCOPY WITH DEBRIDEMENT (Right Wrist)  Patient Location: PACU  Anesthesia Type:GA combined with regional for post-op pain  Level of Consciousness: awake, alert  and oriented  Airway & Oxygen Therapy: Patient Spontanous Breathing and Patient connected to face mask oxygen  Post-op Assessment: Report given to RN and Post -op Vital signs reviewed and stable  Post vital signs: Reviewed and stable  Last Vitals:  Vitals:   04/12/17 1031 04/12/17 1034  BP:  134/66  Pulse: 76 81  Resp: 18 14  Temp:    SpO2: 100% 100%    Last Pain:  Vitals:   04/12/17 1005  TempSrc: Oral  PainSc: 5       Patients Stated Pain Goal: 4 (04/12/17 1005)  Complications: No apparent anesthesia complications

## 2017-04-12 NOTE — Brief Op Note (Signed)
04/12/2017  12:01 PM  PATIENT:  Jean Baldwin  66 y.o. female  PRE-OPERATIVE DIAGNOSIS:  TRIANGULAR FIBROCARTILAGE COMPLEX TEAR RIGHT WRIST   POST-OPERATIVE DIAGNOSIS:  TRIANGULAR FIBROCARTILAGE COMPLEX TEAR RIGHT WRIST   PROCEDURE:  Procedure(s): RIGHT WRIST ARTHROSCOPY WITH DEBRIDEMENT (Right)  SURGEON:  Surgeon(s) and Role:    * Cindee SaltKuzma, Andrw Mcguirt, MD - Primary  PHYSICIAN ASSISTANT:   ASSISTANTS: none   ANESTHESIA:   regional and general  EBL:  5cc  BLOOD ADMINISTERED:none  DRAINS: none   LOCAL MEDICATIONS USED:  NONE  SPECIMEN:  No Specimen  DISPOSITION OF SPECIMEN:  N/A  COUNTS:  YES  TOURNIQUET:  * Missing tourniquet times found for documented tourniquets in log: [id number] *  DICTATION: .Other Dictation: Dictation Number [id number]  PLAN OF CARE: Discharge to home after PACU  PATIENT DISPOSITION:  PACU - hemodynamically stable.

## 2017-04-12 NOTE — Anesthesia Postprocedure Evaluation (Signed)
Anesthesia Post Note  Patient: Jean Baldwin  Procedure(s) Performed: RIGHT WRIST ARTHROSCOPY WITH DEBRIDEMENT (Right Wrist)     Patient location during evaluation: PACU Anesthesia Type: General Level of consciousness: awake and alert Pain management: pain level controlled Vital Signs Assessment: post-procedure vital signs reviewed and stable Respiratory status: spontaneous breathing, nonlabored ventilation, respiratory function stable and patient connected to nasal cannula oxygen Cardiovascular status: blood pressure returned to baseline and stable Postop Assessment: no apparent nausea or vomiting Anesthetic complications: no    Last Vitals:  Vitals:   04/12/17 1230 04/12/17 1247  BP: (!) 142/63 140/68  Pulse: 81 82  Resp: 14 16  Temp:  36.4 C  SpO2: 93% 95%    Last Pain:  Vitals:   04/12/17 1247  TempSrc: Oral  PainSc: 0-No pain                 Kennieth RadFitzgerald, Isla Sabree E

## 2017-04-12 NOTE — Progress Notes (Signed)
Assisted Dr. Rob Fitzgerald with right, ultrasound guided, supraclavicular block. Side rails up, monitors on throughout procedure. See vital signs in flow sheet. Tolerated Procedure well. 

## 2017-04-12 NOTE — Op Note (Signed)
Jean Baldwin:  Jean Baldwin, Jean Baldwin                 ACCOUNT NO.:  [id number]  MEDICAL RECORD NO.:  [id number]  LOCATION:                                 FACILITY:  PHYSICIAN:  Cindee SaltGary Bari Leib, M.D.            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  OPERATION:  Debridement TFCC, removal of loose styloid fragment, in addition scapholunate lunotriquetral tear; right wrist.  SURGEON:  Cindee SaltGary Lewanna Petrak, MD.  ASSISTANT:  None.  ANESTHESIA:  Supraclavicular block, general.  [facility]  ANESTHESIOLOGISSampson Goon:  Fitzgerald.  HISTORY:  The patient is a 66 year old female with a history of wrist pain primarily to the ulnar side.  She has CMC arthritis, which has not been bothering her to the extent that the wrist is.  This has not responded to conservative treatment.  MRI reveals a significant TFCC tear with an old fracture of her ulnar styloid, degenerative changes on the ulnar aspect of her lunate and ulnar head.  She is admitted for arthroscopy, debridement, inspection, the possibility of further surgery being necessary.  Pre, peri, and postop course have been discussed along with risks and complications to the surgery.  She is aware there is no guarantee to the surgery; the possibility of infection; recurrence of injury to arteries, nerves, tendons; incomplete relief of symptoms; and dystrophy.  In the preoperative area, the patient was seen, the extremity marked by both the patient and surgeon.  Antibiotic given.  DESCRIPTION OF PROCEDURE:  The patient was brought to the operating room after a supraclavicular block was carried out in the preoperative area. General anesthetic was carried out in the  operating room under the anesthesia department.  She was prepped using ChloraPrep in supine position with the right arm free.  A 3-minute dry time was allowed, and a time-out taken, confirming the patient and procedure.  The arm was placed in the arc arthroscopy tower and 10 pounds of traction applied. The joint inflated through the 3-4 portal.  Transverse incision was made, deepened with a hemostat.  A blunt trocar was used to enter the joint.  The joint was inspected.  Very significant changes were present with arthritic changes to the ulnar aspect of the scaphoid, radial aspect of the lunate with a partial scapholunate ligament tear.  The ulnar head was immediately visible ulnarly.  There was a very large styloid fragment, which was relatively loose in the joint.  This showed arthritic changes also.  There was arthritic change and loss of cartilage on the ulnar aspect of the lunate, radial side of the triquetrum.  The lunotriquetral joint had been ruptured.  The triangular fibrocartilage was a T-shaped laceration rupture to it with loose fragments floating in the joint.  A 4-5 portal was opened after localization with a 22-gauge needle.  An 18-gauge needle was used for outflow on the ulnar aspect in 6-U.  A full  radius shaver was then introduced.  A partial debridement of the TFCC was performed, but the thickness of the triangular fibrocartilage complex precluded its continued use.  A gator shaver was introduced.  This also had difficulty resecting the portions of the torn loose TFCC due to thickness.  A basket forceps was introduced, this too was not large enough and a large cutting grasper was inserted removing the portions of the TFCC that were floating in the joint.  These were then removed with a regular grasper by alternating the scope between the two 3-4 and 4-5 portal.  A debridement was then further performed over the basket forceps and the full radius and gator shaver.   Relatively loose area of the styloid was attached to the dorsal ulnar segment of the capsule.  A Kleinert-Kutz elevator was inserted and this was used to manipulate the relatively loose area of the ulnar styloid until it was free.  A knee grasper was then inserted and with manipulation, was ultimately able to debride into the 4-5 portal and remove a loose piece of bone, which measured approximately 0.9 x 1.5 cm.  The midcarpal joint was then inspected through the real ulnar midcarpal portal after localization with a 22- gauge needle.  An 18-gauge needle was used for irrigation, transverse incision made and deepened with a hemostat.  A blunt trocar used to enter the joint.  The joint was inspected.  There were no significant changes on the articular surface of the distal proximal carpal row or the proximal portion of the distal carpal row.  There was no gross instability to the lunotriquetral joint or scapholunate joint.  There was no cartilage damage to the tip of the hamate.  The instruments were removed.  The portals closed with interrupted 4-0 nylon sutures. Sterile compressive dressing and volar splint was applied.  Tourniquet was not used during the procedure.  The patient tolerated the procedure well and was taken to the recovery room for observation in satisfactory condition.  She will be discharged to home [facility].          ______________________________ Cindee SaltGary Delesha Pohlman, M.D.     GK/MEDQ  D:  04/12/2017  T:  04/12/2017  Job:  [id number]

## 2017-04-12 NOTE — H&P (Signed)
Jean Baldwin is an 66 y.o. female.   Chief Complaint: right wrist painHPI: Jean Baldwin is a 66 year old right-hand-dominant female referred by Dr. Hyman HopesWebb for consultation regarding swelling of her right wrist this on the ulnar aspect. This been going on for the past 8-10 weeks. She recalls no history of injury. She states that this is constant dull ache with a VAS score 6/10. Ice helps she was placed on a steroid Dosepak which gave her some relief. She has also been wearing a brace. She states however that it irritates the skin over the area that is mildly swollen. She states doing yard work especially twisting seems to aggravate this for her. She recalls no history of past injury. She has a history of arthritis and thyroid problems no history of diabetes or gout. Family history is positive for gout negative for diabetes thyroid problems and arthritis. He does not have any radiation of the discomfort. And localized to this directly over the ulnar head on the ulnar aspect. Her MR reveals tears with multiple ligaments along with triangular fibrocartilage complex without probable ulnocarpal abutment. She has had injections both CMC joint and to the wrist. These have given her some relief but not complete. She continues to complain of a nagging type pain with a VAS score for over 10. Localizes primarily in the ulnar side of her right wrist. She has not complained to the greater greater extent to the [facility] at the present time. She has not complained of any numbness or tingling. She is taking meloxicam for this. She occasionally takes a Tylenol arthritis in addition. She states opening jars twisting her wrist increase her pain. She has recently retired and this has helped. But she continues to complain of a nagging discomfort.              Past Medical History:  Diagnosis Date  . Arthritis    knees  . Dental crowns present   . High cholesterol   . Hypertension    states under control with meds., has been  on med > 20 yr.  . Hypothyroidism   . TFCC (triangular fibrocartilage complex) tear 03/2017   right    Past Surgical History:  Procedure Laterality Date  . KNEE ARTHROSCOPY W/ MENISCAL REPAIR Left   . NASAL SEPTUM SURGERY    . TUBAL LIGATION      History reviewed. No pertinent family history. Social History:  reports that  has never smoked. she has never used smokeless tobacco. She reports that she does not drink alcohol or use drugs.  Allergies:  Allergies  Allergen Reactions  . Codeine Nausea And Vomiting  . Sulfa Antibiotics Rash    Medications Prior to Admission  Medication Sig Dispense Refill  . amLODipine (NORVASC) 5 MG tablet Take 5 mg by mouth daily.    Marland Kitchen. aspirin 81 MG tablet Take 81 mg by mouth daily.    Marland Kitchen. atorvastatin (LIPITOR) 20 MG tablet Take 20 mg by mouth daily.    . calcium carbonate (OSCAL) 1500 (600 Ca) MG TABS tablet Take 600 mg by mouth daily.    Marland Kitchen. levothyroxine (SYNTHROID, LEVOTHROID) 112 MCG tablet Take 112 mcg by mouth daily before breakfast.    . losartan (COZAAR) 100 MG tablet Take 100 mg by mouth daily.     . meloxicam (MOBIC) 15 MG tablet Take 15 mg by mouth daily.    . Multiple Vitamin (MULTIVITAMIN) tablet Take 1 tablet by mouth daily.    . niacin (NIASPAN)  500 MG CR tablet     . vitamin C (ASCORBIC ACID) 500 MG tablet Take 500 mg by mouth daily.      No results found for this or any previous visit (from the past 48 hour(s)).  No results found.   Pertinent items are noted in HPI.  Height 5' 3.5" (1.613 m), weight 81.6 kg (180 lb).  General appearance: alert, cooperative and appears stated age Head: Normocephalic, without obvious abnormality Neck: no JVD Resp: clear to auscultation bilaterally Cardio: regular rate and rhythm, S1, S2 normal, no murmur, click, rub or gallop GI: soft, non-tender; bowel sounds normal; no masses,  no organomegaly Extremities: right wrist pain Pulses: 2+ and symmetric Skin: Skin color, texture, turgor  normal. No rashes or lesions Neurologic: Grossly normal Incision/Wound: na  Assessment/Plan Assessment:  1. Primary osteoarthritis of first carpometacarpal joint of right hand  2. 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 Plan will be debridement as dictated by findings of TFCC LT tear is. She does not appear to have an ulnar carpal abutment. If she does then we would not recommend shortening at that time. She is not interested in a large operation at the present time. She is hoping to see if this will help decrease some of her symptoms for her. She is aware that there is no guarantee to the surgery the possibility of infection recurrence injury to arteries nerves tendons incomplete relief symptoms dystrophy. She is advised this will not affect her thumb. She is scheduled for arthroscopic inspection debridement right wrist and outpatient under regional anesthesia.      Jean Baldwin 04/12/2017, 9:45 AM

## 2017-04-12 NOTE — Discharge Instructions (Addendum)

## 2017-04-12 NOTE — Op Note (Signed)
Dictation Number [id number]

## 2017-04-13 ENCOUNTER — Encounter (HOSPITAL_BASED_OUTPATIENT_CLINIC_OR_DEPARTMENT_OTHER): Payer: Self-pay | Admitting: Orthopedic Surgery

## 2017-04-26 ENCOUNTER — Other Ambulatory Visit: Payer: Self-pay | Admitting: Family Medicine

## 2017-04-26 ENCOUNTER — Ambulatory Visit
Admission: RE | Admit: 2017-04-26 | Discharge: 2017-04-26 | Disposition: A | Discharge status: Home / Self Care | Payer: Medicare Other | Source: Ambulatory Visit | Attending: Family Medicine | Admitting: Family Medicine

## 2017-04-26 DIAGNOSIS — M25551 Pain in right hip: Secondary | ICD-10-CM

## 2017-05-25 ENCOUNTER — Encounter (INDEPENDENT_AMBULATORY_CARE_PROVIDER_SITE_OTHER): Payer: Self-pay | Admitting: Orthopaedic Surgery

## 2017-05-25 ENCOUNTER — Ambulatory Visit (INDEPENDENT_AMBULATORY_CARE_PROVIDER_SITE_OTHER): Payer: Medicare Other | Admitting: Orthopaedic Surgery

## 2017-05-25 DIAGNOSIS — M25551 Pain in right hip: Secondary | ICD-10-CM

## 2017-05-25 DIAGNOSIS — M1611 Unilateral primary osteoarthritis, right hip: Secondary | ICD-10-CM

## 2017-05-25 NOTE — Progress Notes (Signed)
Office Visit Note   Patient: Jean Baldwin           Date of Birth: 10/02/1950           MRN: [id number] Visit Date: 05/25/2017              Requested by: Shirlean MylarWebb, Carol, MD [address] [place], [place] [address] PCP: Shirlean MylarWebb, Carol, MD   Assessment & Plan: Visit Diagnoses:  1. Pain of right hip joint   2. Unilateral primary osteoarthritis, right hip     Plan: She understands that this is severe end-stage arthritis of her right hip.  My only recommendation would be a joint replacement the future if it is bothering her enough to have this route.  She actually came to me because I did the surgery on her son-in-law for severe posttraumatic arthritic changes.  Given the fact that her pain is worsening and daily she is going to consider this sometime in the near future.  We a long and thorough discussion about the surgery with a thorough explanation of her intraoperative and postoperative course as well as a discussion of risks and benefits of surgery.  I gave her our surgery schedulers card and she says she will call us and let us know.  All questions and concerns were answered and addressed.  Follow-Up Instructions: Return for 2 weeks post-op.   Orders:  No orders of the defined types were placed in this encounter.  No orders of the defined types were placed in this encounter.     Procedures: No procedures performed   Clinical Data: No additional findings.   Subjective: Chief Complaint  Patient presents with  . Right Hip - Pain  The patient comes in today for further evaluation and treatment of worsening right hip pain for over a year now with the last 6 months getting significantly worse.  It does hurt in her groin.  It wakes her up at night.  He can be 10 out of 10 at times.  It is detrimentally affecting her active daily living, quality of life, mobility.  She denies any specific injuries.  She has had x-rays in December on the canopy system for my review and she  was told she has severe arthritis in her right hip.  She is tried activity modification as well as anti-inflammatories.  She is tried weight loss and hip strengthening exercises on her own.  She is tried an assistive device when needed.  HPI  Review of Systems She currently denies any headache, chest pain, shortness of breath, fever, chills, nausea, vomiting.  Objective: Vital Signs: There were no vitals taken for this visit.  Physical Exam She is alert and oriented x3 and in no acute distress Ortho Exam Examination of her left hip is normal.  Examination of her right hip shows severe limitations of internal extra rotation and severe pain with attempts of rotation. Specialty Comments:  No specialty comments available.  Imaging: No results found. X-rays independently reviewed of her right hip on the canopy system shows severe end-stage arthritis of the right hip.  There is essentially no joint space remaining.  There are periarticular osteophytes with joint space narrowing that is severe.  There is sclerotic and cystic changes on both the acetabular side and the femoral head.  PMFS History: Patient Active Problem List   Diagnosis Date Noted  . Pain of right hip joint 05/25/2017  . Unilateral primary osteoarthritis, right hip 05/25/2017   Past Medical  History:  Diagnosis Date  . Arthritis    knees  . Dental crowns present   . High cholesterol   . Hypertension    states under control with meds., has been on med > 20 yr.  . Hypothyroidism   . TFCC (triangular fibrocartilage complex) tear 03/2017   right    No family history on file.  Past Surgical History:  Procedure Laterality Date  . KNEE ARTHROSCOPY W/ MENISCAL REPAIR Left   . NASAL SEPTUM SURGERY    . TUBAL LIGATION    . WRIST ARTHROSCOPY WITH DEBRIDEMENT Right 04/12/2017   Procedure: RIGHT WRIST ARTHROSCOPY WITH DEBRIDEMENT;  Surgeon: Cindee Salt, MD;  Location: [facility];  Service: Orthopedics;   Laterality: Right;   Social History   Occupational History  . Not on file  Tobacco Use  . Smoking status: Never Smoker  . Smokeless tobacco: Never Used  Substance and Sexual Activity  . Alcohol use: No  . Drug use: No  . Sexual activity: Not on file

## 2017-06-21 ENCOUNTER — Other Ambulatory Visit: Payer: Self-pay | Admitting: Family Medicine

## 2017-06-21 DIAGNOSIS — Z1231 Encounter for screening mammogram for malignant neoplasm of breast: Secondary | ICD-10-CM

## 2017-06-28 NOTE — Pre-Procedure Instructions (Signed)
Jean Baldwin  06/28/2017      [id number] - [place], [place] - [address] [place] [id number] Phone: [phone] Fax: [phone]    Your procedure is scheduled on March 5  Report to [facility] Admitting at 1100 A.M.  Call this number if you have problems the morning of surgery:  [phone]   Remember:  Do not eat food or drink liquids after midnight.  Continue all medications as directed by your physician except follow these medication instructions before surgery below   Take these medicines the morning of surgery with A SIP OF WATER  amLODipine (NORVASC)  levothyroxine (SYNTHROID, LEVOTHROID) traMADol (ULTRAM)  7 days prior to surgery STOP taking any Aspirin(unless otherwise instructed by your surgeon), Aleve, Naproxen, Ibuprofen, Motrin, Advil, Goody's, BC's, all herbal medications, fish oil, and all vitamins meloxicam (MOBIC)    Do not wear jewelry, make-up or nail polish.  Do not wear lotions, powders, or perfumes, or deodorant.  Do not shave 48 hours prior to surgery.    Do not bring valuables to the hospital.  [facility] is not responsible for any belongings or valuables.  Contacts, dentures or bridgework may not be worn into surgery.  Leave your suitcase in the car.  After surgery it may be brought to your room.  For patients admitted to the hospital, discharge time will be determined by your treatment team.  Patients discharged the day of surgery will not be allowed to drive home.    Special instructions:   [place]- Preparing For Surgery  Before surgery, you can play an important role. Because skin is not sterile, your skin needs to be as free of germs as possible. You can reduce the number of germs on your skin by washing with CHG (chlorahexidine gluconate) Soap before surgery.  CHG is an antiseptic cleaner which kills germs and bonds with the skin to continue  killing germs even after washing.  Please do not use if you have an allergy to CHG or antibacterial soaps. If your skin becomes reddened/irritated stop using the CHG.  Do not shave (including legs and underarms) for at least 48 hours prior to first CHG shower. It is OK to shave your face.  Please follow these instructions carefully.   1. Shower the NIGHT BEFORE SURGERY and the MORNING OF SURGERY with CHG.   2. If you chose to wash your hair, wash your hair first as usual with your normal shampoo.  3. After you shampoo, rinse your hair and body thoroughly to remove the shampoo.  4. Use CHG as you would any other liquid soap. You can apply CHG directly to the skin and wash gently with a scrungie or a clean washcloth.   5. Apply the CHG Soap to your body ONLY FROM THE NECK DOWN.  Do not use on open wounds or open sores. Avoid contact with your eyes, ears, mouth and genitals (private parts). Wash Face and genitals (private parts)  with your normal soap.  6. Wash thoroughly, paying special attention to the area where your surgery will be performed.  7. Thoroughly rinse your body with warm water from the neck down.  8. DO NOT shower/wash with your normal soap after using and rinsing off the CHG Soap.  9. Pat yourself dry with a CLEAN TOWEL.  10. Wear CLEAN PAJAMAS to bed the night before surgery, wear  comfortable clothes the morning of surgery  11. Place CLEAN SHEETS on your bed the night of your first shower and DO NOT SLEEP WITH PETS.    Day of Surgery: Do not apply any deodorants/lotions. Please wear clean clothes to the hospital/surgery center.      Please read over the following fact sheets that you were given.

## 2017-06-29 ENCOUNTER — Encounter (HOSPITAL_COMMUNITY)
Admission: RE | Admit: 2017-06-29 | Discharge: 2017-06-29 | Disposition: A | Discharge status: Home / Self Care | Payer: Medicare Other | Source: Ambulatory Visit | Attending: Orthopaedic Surgery | Admitting: Orthopaedic Surgery

## 2017-06-29 ENCOUNTER — Other Ambulatory Visit: Payer: Self-pay

## 2017-06-29 ENCOUNTER — Encounter (HOSPITAL_COMMUNITY): Payer: Self-pay

## 2017-06-29 DIAGNOSIS — M1611 Unilateral primary osteoarthritis, right hip: Secondary | ICD-10-CM | POA: Insufficient documentation

## 2017-06-29 DIAGNOSIS — Z01818 Encounter for other preprocedural examination: Secondary | ICD-10-CM | POA: Diagnosis not present

## 2017-06-29 LAB — BASIC METABOLIC PANEL
ANION GAP: 11 (ref 5–15)
BUN: 25 mg/dL — ABNORMAL HIGH (ref 6–20)
CO2: 23 mmol/L (ref 22–32)
Calcium: 9.7 mg/dL (ref 8.9–10.3)
Chloride: 105 mmol/L (ref 101–111)
Creatinine, Ser: 0.74 mg/dL (ref 0.44–1.00)
GLUCOSE: 106 mg/dL — AB (ref 65–99)
Potassium: 4 mmol/L (ref 3.5–5.1)
SODIUM: 139 mmol/L (ref 135–145)

## 2017-06-29 LAB — CBC
HCT: 40.3 % (ref 36.0–46.0)
HEMOGLOBIN: 13.1 g/dL (ref 12.0–15.0)
MCH: 27.7 pg (ref 26.0–34.0)
MCHC: 32.5 g/dL (ref 30.0–36.0)
MCV: 85.2 fL (ref 78.0–100.0)
Platelets: 309 10*3/uL (ref 150–400)
RBC: 4.73 MIL/uL (ref 3.87–5.11)
RDW: 15.1 % (ref 11.5–15.5)
WBC: 8.8 10*3/uL (ref 4.0–10.5)

## 2017-06-29 LAB — SURGICAL PCR SCREEN
MRSA, PCR: NEGATIVE
STAPHYLOCOCCUS AUREUS: POSITIVE — AB

## 2017-06-29 NOTE — Progress Notes (Signed)
[facility] [phone], left message for patient with instructions left call back number for rquestions

## 2017-06-29 NOTE — Progress Notes (Signed)
PCP - Shirlean Mylararol webb Cardiologist - denies  Chest x-ray - not needed EKG - 04/07/17 Stress Test - 2016 ECHO - denies Cardiac Cath - denies    Aspirin Instructions: last dose 06/25/17  Anesthesia review: NO  Patient denies shortness of breath, fever, cough and chest pain at PAT appointment   Patient verbalized understanding of instructions that were given to them at the PAT appointment. Patient was also instructed that they will need to review over the PAT instructions again at home before surgery.

## 2017-06-30 ENCOUNTER — Other Ambulatory Visit (INDEPENDENT_AMBULATORY_CARE_PROVIDER_SITE_OTHER): Payer: Self-pay | Admitting: Physician Assistant

## 2017-07-04 ENCOUNTER — Other Ambulatory Visit (INDEPENDENT_AMBULATORY_CARE_PROVIDER_SITE_OTHER): Payer: Self-pay

## 2017-07-07 ENCOUNTER — Ambulatory Visit
Admission: RE | Admit: 2017-07-07 | Discharge: 2017-07-07 | Disposition: A | Discharge status: Home / Self Care | Payer: Medicare Other | Source: Ambulatory Visit | Attending: Family Medicine | Admitting: Family Medicine

## 2017-07-07 DIAGNOSIS — Z1231 Encounter for screening mammogram for malignant neoplasm of breast: Secondary | ICD-10-CM

## 2017-07-08 ENCOUNTER — Ambulatory Visit: Payer: Medicare Other

## 2017-07-08 ENCOUNTER — Other Ambulatory Visit: Payer: Self-pay | Admitting: Family Medicine

## 2017-07-08 DIAGNOSIS — R928 Other abnormal and inconclusive findings on diagnostic imaging of breast: Secondary | ICD-10-CM

## 2017-07-11 ENCOUNTER — Ambulatory Visit
Admission: RE | Admit: 2017-07-11 | Discharge: 2017-07-11 | Disposition: A | Discharge status: Home / Self Care | Payer: Medicare Other | Source: Ambulatory Visit | Attending: Family Medicine | Admitting: Family Medicine

## 2017-07-11 DIAGNOSIS — R928 Other abnormal and inconclusive findings on diagnostic imaging of breast: Secondary | ICD-10-CM

## 2017-07-11 MED ORDER — TRANEXAMIC ACID 1000 MG/10ML IV SOLN
1000.0000 mg | INTRAVENOUS | Status: AC
  Administered 2017-07-12: 1000 mg via INTRAVENOUS
  Filled 2017-07-11: qty 1100

## 2017-07-11 MED ORDER — CEFAZOLIN SODIUM-DEXTROSE 2-4 GM/100ML-% IV SOLN
2.0000 g | INTRAVENOUS | Status: AC
  Administered 2017-07-12: 2 g via INTRAVENOUS
  Filled 2017-07-11: qty 100

## 2017-07-12 ENCOUNTER — Inpatient Hospital Stay (HOSPITAL_COMMUNITY)
Admission: RE | Admit: 2017-07-12 | Discharge: 2017-07-15 | DRG: 470 | Disposition: A | Discharge status: Home Health | Payer: Medicare Other | Source: Ambulatory Visit | Attending: Orthopaedic Surgery | Admitting: Orthopaedic Surgery

## 2017-07-12 ENCOUNTER — Inpatient Hospital Stay (HOSPITAL_COMMUNITY): Payer: Medicare Other | Admitting: Anesthesiology

## 2017-07-12 ENCOUNTER — Encounter (HOSPITAL_COMMUNITY): Payer: Self-pay | Admitting: Surgery

## 2017-07-12 ENCOUNTER — Other Ambulatory Visit: Payer: Self-pay

## 2017-07-12 ENCOUNTER — Encounter (HOSPITAL_COMMUNITY)
Admission: RE | Disposition: A | Discharge status: Home Health | Payer: Self-pay | Source: Ambulatory Visit | Attending: Orthopaedic Surgery

## 2017-07-12 ENCOUNTER — Inpatient Hospital Stay (HOSPITAL_COMMUNITY): Payer: Medicare Other

## 2017-07-12 DIAGNOSIS — Z96641 Presence of right artificial hip joint: Secondary | ICD-10-CM

## 2017-07-12 DIAGNOSIS — M25751 Osteophyte, right hip: Secondary | ICD-10-CM | POA: Diagnosis present

## 2017-07-12 DIAGNOSIS — R110 Nausea: Secondary | ICD-10-CM | POA: Diagnosis not present

## 2017-07-12 DIAGNOSIS — Z885 Allergy status to narcotic agent status: Secondary | ICD-10-CM

## 2017-07-12 DIAGNOSIS — I10 Essential (primary) hypertension: Secondary | ICD-10-CM | POA: Diagnosis present

## 2017-07-12 DIAGNOSIS — Z882 Allergy status to sulfonamides status: Secondary | ICD-10-CM

## 2017-07-12 DIAGNOSIS — M25551 Pain in right hip: Secondary | ICD-10-CM | POA: Diagnosis present

## 2017-07-12 DIAGNOSIS — E7800 Pure hypercholesterolemia, unspecified: Secondary | ICD-10-CM | POA: Diagnosis present

## 2017-07-12 DIAGNOSIS — E039 Hypothyroidism, unspecified: Secondary | ICD-10-CM | POA: Diagnosis present

## 2017-07-12 DIAGNOSIS — M1611 Unilateral primary osteoarthritis, right hip: Principal | ICD-10-CM | POA: Diagnosis present

## 2017-07-12 DIAGNOSIS — Z419 Encounter for procedure for purposes other than remedying health state, unspecified: Secondary | ICD-10-CM

## 2017-07-12 HISTORY — PX: TOTAL HIP ARTHROPLASTY: SHX124

## 2017-07-12 SURGERY — ARTHROPLASTY, HIP, TOTAL, ANTERIOR APPROACH
Anesthesia: Spinal | Laterality: Right

## 2017-07-12 MED ORDER — ONDANSETRON HCL 4 MG/2ML IJ SOLN
4.0000 mg | Freq: Four times a day (QID) | INTRAMUSCULAR | Status: DC | PRN
  Administered 2017-07-13: 4 mg via INTRAVENOUS
  Filled 2017-07-12: qty 2

## 2017-07-12 MED ORDER — DIPHENHYDRAMINE HCL 12.5 MG/5ML PO ELIX: 12.5000 mg | ORAL_SOLUTION | ORAL | Status: DC | PRN

## 2017-07-12 MED ORDER — ASPIRIN 81 MG PO CHEW
81.0000 mg | CHEWABLE_TABLET | Freq: Two times a day (BID) | ORAL | Status: DC
  Administered 2017-07-13 – 2017-07-15 (×5): 81 mg via ORAL
  Filled 2017-07-12 (×5): qty 1

## 2017-07-12 MED ORDER — FENTANYL CITRATE (PF) 100 MCG/2ML IJ SOLN
25.0000 ug | INTRAMUSCULAR | Status: DC | PRN
  Administered 2017-07-12 (×2): 25 ug via INTRAVENOUS
  Administered 2017-07-12: 50 ug via INTRAVENOUS

## 2017-07-12 MED ORDER — EPINEPHRINE PF 1 MG/ML IJ SOLN
INTRAMUSCULAR | Status: AC
  Filled 2017-07-12: qty 1

## 2017-07-12 MED ORDER — ALUM & MAG HYDROXIDE-SIMETH 200-200-20 MG/5ML PO SUSP: 30.0000 mL | ORAL | Status: DC | PRN

## 2017-07-12 MED ORDER — MIDAZOLAM HCL 2 MG/2ML IJ SOLN
INTRAMUSCULAR | Status: DC | PRN
  Administered 2017-07-12: 2 mg via INTRAVENOUS

## 2017-07-12 MED ORDER — DEXAMETHASONE SODIUM PHOSPHATE 10 MG/ML IJ SOLN
INTRAMUSCULAR | Status: DC | PRN
  Administered 2017-07-12: 4 mg via INTRAVENOUS

## 2017-07-12 MED ORDER — BUPIVACAINE IN DEXTROSE 0.75-8.25 % IT SOLN
INTRATHECAL | Status: DC | PRN
Start: 2017-07-12 — End: 2017-07-12
  Administered 2017-07-12: 2 mL via INTRATHECAL

## 2017-07-12 MED ORDER — HYDROMORPHONE HCL 1 MG/ML IJ SOLN: 0.5000 mg | INTRAMUSCULAR | Status: DC | PRN

## 2017-07-12 MED ORDER — SCOPOLAMINE 1 MG/3DAYS TD PT72
MEDICATED_PATCH | TRANSDERMAL | Status: DC | PRN
  Administered 2017-07-12: 1 via TRANSDERMAL

## 2017-07-12 MED ORDER — ROCURONIUM BROMIDE 10 MG/ML (PF) SYRINGE
PREFILLED_SYRINGE | INTRAVENOUS | Status: AC
  Filled 2017-07-12: qty 5

## 2017-07-12 MED ORDER — METOCLOPRAMIDE HCL 5 MG PO TABS: 5.0000 mg | ORAL_TABLET | Freq: Three times a day (TID) | ORAL | Status: DC | PRN

## 2017-07-12 MED ORDER — FENTANYL CITRATE (PF) 100 MCG/2ML IJ SOLN
INTRAMUSCULAR | Status: AC
  Filled 2017-07-12: qty 2

## 2017-07-12 MED ORDER — CALCIUM CARBONATE 1500 (600 CA) MG PO TABS
2.0000 | ORAL_TABLET | Freq: Every day | ORAL | Status: DC
  Filled 2017-07-12: qty 2

## 2017-07-12 MED ORDER — LIDOCAINE 2% (20 MG/ML) 5 ML SYRINGE
INTRAMUSCULAR | Status: DC | PRN
  Administered 2017-07-12: 100 mg via INTRAVENOUS

## 2017-07-12 MED ORDER — LEVOTHYROXINE SODIUM 100 MCG PO TABS
100.0000 ug | ORAL_TABLET | Freq: Every day | ORAL | Status: DC
  Administered 2017-07-13 – 2017-07-15 (×3): 100 ug via ORAL
  Filled 2017-07-12 (×3): qty 1

## 2017-07-12 MED ORDER — ADULT MULTIVITAMIN W/MINERALS CH
1.0000 | ORAL_TABLET | Freq: Every day | ORAL | Status: DC
  Administered 2017-07-13 – 2017-07-15 (×3): 1 via ORAL
  Filled 2017-07-12 (×3): qty 1

## 2017-07-12 MED ORDER — METHOCARBAMOL 500 MG PO TABS
ORAL_TABLET | ORAL | Status: AC
  Filled 2017-07-12: qty 1

## 2017-07-12 MED ORDER — DEXAMETHASONE SODIUM PHOSPHATE 10 MG/ML IJ SOLN
INTRAMUSCULAR | Status: AC
  Filled 2017-07-12: qty 1

## 2017-07-12 MED ORDER — SODIUM CHLORIDE 0.9 % IV SOLN
INTRAVENOUS | Status: DC
  Administered 2017-07-13: 03:00:00 via INTRAVENOUS

## 2017-07-12 MED ORDER — DOCUSATE SODIUM 100 MG PO CAPS
100.0000 mg | ORAL_CAPSULE | Freq: Two times a day (BID) | ORAL | Status: DC
  Administered 2017-07-12 – 2017-07-15 (×6): 100 mg via ORAL
  Filled 2017-07-12 (×6): qty 1

## 2017-07-12 MED ORDER — LACTATED RINGERS IV SOLN
INTRAVENOUS | Status: DC
  Administered 2017-07-12 (×2): via INTRAVENOUS

## 2017-07-12 MED ORDER — CHLORHEXIDINE GLUCONATE 4 % EX LIQD: 60.0000 mL | Freq: Once | CUTANEOUS | Status: DC

## 2017-07-12 MED ORDER — PROPOFOL 500 MG/50ML IV EMUL
INTRAVENOUS | Status: DC | PRN
  Administered 2017-07-12: 75 ug/kg/min via INTRAVENOUS

## 2017-07-12 MED ORDER — FENTANYL CITRATE (PF) 100 MCG/2ML IJ SOLN
INTRAMUSCULAR | Status: DC | PRN
  Administered 2017-07-12 (×2): 50 ug via INTRAVENOUS

## 2017-07-12 MED ORDER — NIACIN ER (ANTIHYPERLIPIDEMIC) 500 MG PO TBCR
500.0000 mg | EXTENDED_RELEASE_TABLET | Freq: Every day | ORAL | Status: DC
  Administered 2017-07-12 – 2017-07-14 (×3): 500 mg via ORAL
  Filled 2017-07-12 (×4): qty 1

## 2017-07-12 MED ORDER — PANTOPRAZOLE SODIUM 40 MG PO TBEC
40.0000 mg | DELAYED_RELEASE_TABLET | Freq: Every day | ORAL | Status: DC
  Administered 2017-07-13 – 2017-07-15 (×3): 40 mg via ORAL
  Filled 2017-07-12 (×3): qty 1

## 2017-07-12 MED ORDER — ACETAMINOPHEN 325 MG PO TABS
325.0000 mg | ORAL_TABLET | Freq: Four times a day (QID) | ORAL | Status: DC | PRN
  Administered 2017-07-13: 650 mg via ORAL
  Filled 2017-07-12 (×2): qty 2

## 2017-07-12 MED ORDER — TRAMADOL HCL 50 MG PO TABS
100.0000 mg | ORAL_TABLET | Freq: Four times a day (QID) | ORAL | Status: DC | PRN
  Administered 2017-07-13 – 2017-07-15 (×5): 100 mg via ORAL
  Filled 2017-07-12 (×5): qty 2

## 2017-07-12 MED ORDER — ATORVASTATIN CALCIUM 20 MG PO TABS
20.0000 mg | ORAL_TABLET | Freq: Every evening | ORAL | Status: DC
  Administered 2017-07-12 – 2017-07-14 (×3): 20 mg via ORAL
  Filled 2017-07-12 (×3): qty 1

## 2017-07-12 MED ORDER — PHENYLEPHRINE 40 MCG/ML (10ML) SYRINGE FOR IV PUSH (FOR BLOOD PRESSURE SUPPORT)
PREFILLED_SYRINGE | INTRAVENOUS | Status: DC | PRN
  Administered 2017-07-12: 40 ug via INTRAVENOUS

## 2017-07-12 MED ORDER — ONDANSETRON HCL 4 MG PO TABS
4.0000 mg | ORAL_TABLET | Freq: Four times a day (QID) | ORAL | Status: DC | PRN
  Administered 2017-07-14: 4 mg via ORAL
  Filled 2017-07-12: qty 1

## 2017-07-12 MED ORDER — CEFAZOLIN SODIUM-DEXTROSE 1-4 GM/50ML-% IV SOLN
1.0000 g | Freq: Four times a day (QID) | INTRAVENOUS | Status: AC
  Administered 2017-07-12 – 2017-07-13 (×2): 1 g via INTRAVENOUS
  Filled 2017-07-12 (×2): qty 50

## 2017-07-12 MED ORDER — OXYCODONE HCL 5 MG PO TABS
5.0000 mg | ORAL_TABLET | ORAL | Status: DC | PRN
  Administered 2017-07-12: 10 mg via ORAL

## 2017-07-12 MED ORDER — METHOCARBAMOL 1000 MG/10ML IJ SOLN
500.0000 mg | Freq: Four times a day (QID) | INTRAVENOUS | Status: DC | PRN
  Filled 2017-07-12: qty 5

## 2017-07-12 MED ORDER — HYDROMORPHONE HCL 1 MG/ML IJ SOLN
INTRAMUSCULAR | Status: AC
Start: 2017-07-12 — End: ?
  Filled 2017-07-12: qty 0.5

## 2017-07-12 MED ORDER — SODIUM CHLORIDE 0.9 % IR SOLN
Status: DC | PRN
  Administered 2017-07-12: 3000 mL

## 2017-07-12 MED ORDER — VITAMIN C 500 MG PO TABS
500.0000 mg | ORAL_TABLET | Freq: Every day | ORAL | Status: DC
  Administered 2017-07-13 – 2017-07-15 (×3): 500 mg via ORAL
  Filled 2017-07-12 (×3): qty 1

## 2017-07-12 MED ORDER — BUPIVACAINE HCL (PF) 0.25 % IJ SOLN
INTRAMUSCULAR | Status: AC
  Filled 2017-07-12: qty 30

## 2017-07-12 MED ORDER — MIDAZOLAM HCL 2 MG/2ML IJ SOLN
INTRAMUSCULAR | Status: AC
  Filled 2017-07-12: qty 2

## 2017-07-12 MED ORDER — LORATADINE 10 MG PO TABS
10.0000 mg | ORAL_TABLET | Freq: Every day | ORAL | Status: DC
  Administered 2017-07-12 – 2017-07-15 (×4): 10 mg via ORAL
  Filled 2017-07-12 (×4): qty 1

## 2017-07-12 MED ORDER — OXYCODONE HCL 5 MG PO TABS
ORAL_TABLET | ORAL | Status: AC
  Filled 2017-07-12: qty 2

## 2017-07-12 MED ORDER — CALCIUM CARBONATE 1250 (500 CA) MG PO TABS
2.0000 | ORAL_TABLET | Freq: Every day | ORAL | Status: DC
  Administered 2017-07-14 – 2017-07-15 (×2): 1000 mg via ORAL
  Filled 2017-07-12 (×3): qty 1

## 2017-07-12 MED ORDER — LOSARTAN POTASSIUM 50 MG PO TABS
100.0000 mg | ORAL_TABLET | Freq: Every day | ORAL | Status: DC
  Administered 2017-07-12 – 2017-07-15 (×4): 100 mg via ORAL
  Filled 2017-07-12 (×4): qty 2

## 2017-07-12 MED ORDER — ONDANSETRON HCL 4 MG/2ML IJ SOLN
INTRAMUSCULAR | Status: DC | PRN
  Administered 2017-07-12: 4 mg via INTRAVENOUS

## 2017-07-12 MED ORDER — GABAPENTIN 100 MG PO CAPS
100.0000 mg | ORAL_CAPSULE | Freq: Three times a day (TID) | ORAL | Status: DC
  Administered 2017-07-12 – 2017-07-15 (×8): 100 mg via ORAL
  Filled 2017-07-12 (×8): qty 1

## 2017-07-12 MED ORDER — METOCLOPRAMIDE HCL 5 MG/ML IJ SOLN: 5.0000 mg | Freq: Three times a day (TID) | INTRAMUSCULAR | Status: DC | PRN

## 2017-07-12 MED ORDER — MENTHOL 3 MG MT LOZG: 1.0000 | LOZENGE | OROMUCOSAL | Status: DC | PRN

## 2017-07-12 MED ORDER — ONDANSETRON HCL 4 MG/2ML IJ SOLN
INTRAMUSCULAR | Status: AC
  Filled 2017-07-12: qty 2

## 2017-07-12 MED ORDER — FENTANYL CITRATE (PF) 250 MCG/5ML IJ SOLN
INTRAMUSCULAR | Status: AC
  Filled 2017-07-12: qty 5

## 2017-07-12 MED ORDER — PHENOL 1.4 % MT LIQD: 1.0000 | OROMUCOSAL | Status: DC | PRN

## 2017-07-12 MED ORDER — LIDOCAINE 2% (20 MG/ML) 5 ML SYRINGE
INTRAMUSCULAR | Status: AC
  Filled 2017-07-12: qty 5

## 2017-07-12 MED ORDER — POLYETHYLENE GLYCOL 3350 17 G PO PACK: 17.0000 g | PACK | Freq: Every day | ORAL | Status: DC | PRN

## 2017-07-12 MED ORDER — OXYCODONE HCL 5 MG PO TABS
10.0000 mg | ORAL_TABLET | ORAL | Status: DC | PRN
  Administered 2017-07-13 (×2): 15 mg via ORAL
  Filled 2017-07-12 (×3): qty 3

## 2017-07-12 MED ORDER — METHOCARBAMOL 500 MG PO TABS
500.0000 mg | ORAL_TABLET | Freq: Four times a day (QID) | ORAL | Status: DC | PRN
Start: 2017-07-12 — End: 2017-07-15
  Administered 2017-07-12 – 2017-07-15 (×6): 500 mg via ORAL
  Filled 2017-07-12 (×6): qty 1

## 2017-07-12 MED ORDER — 0.9 % SODIUM CHLORIDE (POUR BTL) OPTIME
TOPICAL | Status: DC | PRN
  Administered 2017-07-12: 1000 mL

## 2017-07-12 MED ORDER — HYDROMORPHONE HCL 1 MG/ML IJ SOLN
INTRAMUSCULAR | Status: DC | PRN
  Administered 2017-07-12: 0.5 mg via INTRAVENOUS

## 2017-07-12 SURGICAL SUPPLY — 56 items
APL SKNCLS STERI-STRIP NONHPOA (GAUZE/BANDAGES/DRESSINGS) ×1
BENZOIN TINCTURE PRP APPL 2/3 (GAUZE/BANDAGES/DRESSINGS) ×2 IMPLANT
BLADE CLIPPER SURG (BLADE) IMPLANT
BLADE SAW SGTL 18X1.27X75 (BLADE) ×2 IMPLANT
CAPT HIP TOTAL 2 ×1 IMPLANT
CELLS DAT CNTRL 66122 CELL SVR (MISCELLANEOUS) ×1 IMPLANT
COVER SURGICAL LIGHT HANDLE (MISCELLANEOUS) ×2 IMPLANT
DRAPE C-ARM 42X72 X-RAY (DRAPES) ×2 IMPLANT
DRAPE STERI IOBAN 125X83 (DRAPES) ×2 IMPLANT
DRAPE U-SHAPE 47X51 STRL (DRAPES) ×6 IMPLANT
DRSG AQUACEL AG ADV 3.5X10 (GAUZE/BANDAGES/DRESSINGS) ×2 IMPLANT
DURAPREP 26ML APPLICATOR (WOUND CARE) ×2 IMPLANT
ELECT BLADE 4.0 EZ CLEAN MEGAD (MISCELLANEOUS) ×2
ELECT BLADE 6.5 EXT (BLADE) ×1 IMPLANT
ELECT REM PT RETURN 9FT ADLT (ELECTROSURGICAL) ×2
ELECTRODE BLDE 4.0 EZ CLN MEGD (MISCELLANEOUS) ×1 IMPLANT
ELECTRODE REM PT RTRN 9FT ADLT (ELECTROSURGICAL) ×1 IMPLANT
FACESHIELD WRAPAROUND (MASK) ×4 IMPLANT
FACESHIELD WRAPAROUND OR TEAM (MASK) ×2 IMPLANT
GAUZE XEROFORM 1X8 LF (GAUZE/BANDAGES/DRESSINGS) ×1 IMPLANT
GLOVE BIOGEL PI IND STRL 6.5 (GLOVE) IMPLANT
GLOVE BIOGEL PI IND STRL 8 (GLOVE) ×2 IMPLANT
GLOVE BIOGEL PI INDICATOR 6.5 (GLOVE) ×1
GLOVE BIOGEL PI INDICATOR 8 (GLOVE) ×2
GLOVE ECLIPSE 8.0 STRL XLNG CF (GLOVE) ×2 IMPLANT
GLOVE ORTHO TXT STRL SZ7.5 (GLOVE) ×4 IMPLANT
GOWN STRL REUS W/ TWL LRG LVL3 (GOWN DISPOSABLE) ×2 IMPLANT
GOWN STRL REUS W/ TWL XL LVL3 (GOWN DISPOSABLE) ×2 IMPLANT
GOWN STRL REUS W/TWL LRG LVL3 (GOWN DISPOSABLE) ×4
GOWN STRL REUS W/TWL XL LVL3 (GOWN DISPOSABLE) ×4
HANDPIECE INTERPULSE COAX TIP (DISPOSABLE) ×2
KIT BASIN OR (CUSTOM PROCEDURE TRAY) ×2 IMPLANT
KIT ROOM TURNOVER OR (KITS) ×2 IMPLANT
MANIFOLD NEPTUNE II (INSTRUMENTS) ×2 IMPLANT
NS IRRIG 1000ML POUR BTL (IV SOLUTION) ×2 IMPLANT
PACK TOTAL JOINT (CUSTOM PROCEDURE TRAY) ×2 IMPLANT
PAD ARMBOARD 7.5X6 YLW CONV (MISCELLANEOUS) ×2 IMPLANT
RETRACTOR WND ALEXIS 18 MED (MISCELLANEOUS) ×1 IMPLANT
RTRCTR WOUND ALEXIS 18CM MED (MISCELLANEOUS) ×2
SET HNDPC FAN SPRY TIP SCT (DISPOSABLE) ×1 IMPLANT
STAPLER VISISTAT 35W (STAPLE) ×1 IMPLANT
STRIP CLOSURE SKIN 1/2X4 (GAUZE/BANDAGES/DRESSINGS) ×2 IMPLANT
SUT ETHIBOND NAB CT1 #1 30IN (SUTURE) ×2 IMPLANT
SUT MNCRL AB 4-0 PS2 18 (SUTURE) IMPLANT
SUT VIC AB 0 CT1 27 (SUTURE) ×2
SUT VIC AB 0 CT1 27XBRD ANBCTR (SUTURE) ×1 IMPLANT
SUT VIC AB 1 CT1 27 (SUTURE) ×2
SUT VIC AB 1 CT1 27XBRD ANBCTR (SUTURE) ×1 IMPLANT
SUT VIC AB 2-0 CT1 27 (SUTURE) ×2
SUT VIC AB 2-0 CT1 TAPERPNT 27 (SUTURE) ×1 IMPLANT
TOWEL OR 17X24 6PK STRL BLUE (TOWEL DISPOSABLE) ×2 IMPLANT
TOWEL OR 17X26 10 PK STRL BLUE (TOWEL DISPOSABLE) ×2 IMPLANT
TRAY CATH 16FR W/PLASTIC CATH (SET/KITS/TRAYS/PACK) IMPLANT
TRAY FOLEY W/METER SILVER 16FR (SET/KITS/TRAYS/PACK) IMPLANT
WATER STERILE IRR 1000ML POUR (IV SOLUTION) ×4 IMPLANT
YANKAUER SUCT BULB TIP NO VENT (SUCTIONS) ×1 IMPLANT

## 2017-07-12 NOTE — Anesthesia Preprocedure Evaluation (Addendum)
Anesthesia Evaluation  Patient identified by MRN, date of birth, ID band Patient awake    Reviewed: Allergy & Precautions, NPO status , Patient's Chart, lab work & pertinent test results  Airway Mallampati: II  TM Distance: >3 FB     Dental   Pulmonary neg pulmonary ROS,    breath sounds clear to auscultation       Cardiovascular hypertension, Pt. on medications  Rhythm:Regular Rate:Normal     Neuro/Psych    GI/Hepatic negative GI ROS, Neg liver ROS,   Endo/Other  Hypothyroidism   Renal/GU negative Renal ROS     Musculoskeletal  (+) Arthritis ,   Abdominal   Peds  Hematology   Anesthesia Other Findings   Reproductive/Obstetrics                           Anesthesia Physical Anesthesia Plan  ASA: III  Anesthesia Plan: Spinal   Post-op Pain Management:    Induction: Intravenous  PONV Risk Score and Plan: 2 and Treatment may vary due to age or medical condition, Ondansetron, Dexamethasone and Propofol infusion  Airway Management Planned: Simple Face Mask  Additional Equipment:   Intra-op Plan:   Post-operative Plan:   Informed Consent: I have reviewed the patients History and Physical, chart, labs and discussed the procedure including the risks, benefits and alternatives for the proposed anesthesia with the patient or authorized representative who has indicated his/her understanding and acceptance.   Dental advisory given  Plan Discussed with: CRNA  Anesthesia Plan Comments: (Received sign out from Dr. Chilton SiGreen. Reviewed anesthetic plan with patient and she felt like a spinal would be a better fit. She denies anticoagulants, other than 81mg  ASA, or other bleeding issues. Plts >300k. Discussed risks and the patient agreed to spinal anesthetic.)       Anesthesia Quick Evaluation

## 2017-07-12 NOTE — Brief Op Note (Signed)
07/12/2017  8:05 PM  PATIENT:  Jean Baldwin  67 y.o. female  PRE-OPERATIVE DIAGNOSIS:  severe endstage arthritis right hip  POST-OPERATIVE DIAGNOSIS:  severe endstage arthritis right hip  PROCEDURE:  Procedure(s): RIGHT TOTAL HIP ARTHROPLASTY ANTERIOR APPROACH (Right)  SURGEON:  Surgeon(s) and Role:    Kathryne Hitch* Anyelo Mccue Y, MD - Primary  PHYSICIAN ASSISTANT: Clearance CootsLindsey Stansberry, PA-C  ANESTHESIA:   spinal  EBL:  250 mL   COUNTS:  YES  DICTATION: .Other Dictation: Dictation Number [id number]  PLAN OF CARE: Admit to inpatient   PATIENT DISPOSITION:  PACU - hemodynamically stable.   Delay start of Pharmacological VTE agent (>24hrs) due to surgical blood loss or risk of bleeding: no

## 2017-07-12 NOTE — Anesthesia Procedure Notes (Signed)
Procedure Name: MAC Date/Time: 07/12/2017 6:43 PM Performed by: Teressa Lower., CRNA Pre-anesthesia Checklist: Patient identified, Emergency Drugs available, Suction available, Patient being monitored and Timeout performed Patient Re-evaluated:Patient Re-evaluated prior to induction Oxygen Delivery Method: Nasal cannula

## 2017-07-12 NOTE — H&P (Signed)
TOTAL HIP ADMISSION H&P  Patient is admitted for right total hip arthroplasty.  Subjective:  Chief Complaint: right hip pain  HPI: Jean Baldwin, 67 y.o. female, has a history of pain and functional disability in the right hip(s) due to arthritis and patient has failed non-surgical conservative treatments for greater than 12 weeks to include NSAID's and/or analgesics, corticosteriod injections, flexibility and strengthening excercises, use of assistive devices, weight reduction as appropriate and activity modification.  Onset of symptoms was gradual starting 2 years ago with gradually worsening course since that time.The patient noted no past surgery on the right hip(s).  Patient currently rates pain in the right hip at 10 out of 10 with activity. Patient has night pain, worsening of pain with activity and weight bearing, trendelenberg gait, pain that interfers with activities of daily living and pain with passive range of motion. Patient has evidence of subchondral cysts, subchondral sclerosis, periarticular osteophytes and joint space narrowing by imaging studies. This condition presents safety issues increasing the risk of falls.  There is no current active infection.  Patient Active Problem List   Diagnosis Date Noted  . Pain of right hip joint 05/25/2017  . Unilateral primary osteoarthritis, right hip 05/25/2017   Past Medical History:  Diagnosis Date  . Arthritis    knees  . Dental crowns present   . High cholesterol   . Hypertension    states under control with meds., has been on med > 20 yr.  . Hypothyroidism   . TFCC (triangular fibrocartilage complex) tear 03/2017   right wrist/wrist    Past Surgical History:  Procedure Laterality Date  . KNEE ARTHROSCOPY W/ MENISCAL REPAIR Left   . NASAL SEPTUM SURGERY    . TUBAL LIGATION    . WRIST ARTHROSCOPY WITH DEBRIDEMENT Right 04/12/2017   Procedure: RIGHT WRIST ARTHROSCOPY WITH DEBRIDEMENT;  Surgeon: Cindee SaltKuzma, Gary, MD;  Location: [facility];  Service: Orthopedics;  Laterality: Right;    Current Facility-Administered Medications  Medication Dose Route Frequency Provider Last Rate Last Dose  . ceFAZolin (ANCEF) IVPB 2g/100 mL premix  2 g Intravenous To SS-Surg Kathryne HitchBlackman, Christopher Y, MD      . chlorhexidine (HIBICLENS) 4 % liquid 4 application  60 mL Topical Once Kirtland Bouchardlark, Gilbert W, PA-C      . lactated ringers infusion   Intravenous Continuous Val EagleMoser, Christopher, MD 10 mL/hr at 07/12/17 1339    . tranexamic acid (CYKLOKAPRON) 1,000 mg in sodium chloride 0.9 % 100 mL IVPB  1,000 mg Intravenous To OR Kathryne HitchBlackman, Christopher Y, MD       Allergies  Allergen Reactions  . Codeine Nausea And Vomiting  . Sulfa Antibiotics Rash    Social History   Tobacco Use  . Smoking status: Never Smoker  . Smokeless tobacco: Never Used  Substance Use Topics  . Alcohol use: No    History reviewed. No pertinent family history.   Review of Systems  Musculoskeletal: Positive for joint pain.  All other systems reviewed and are negative.   Objective:  Physical Exam  Constitutional: She is oriented to person, place, and time. She appears well-developed and well-nourished.  HENT:  Head: Normocephalic and atraumatic.  Eyes: EOM are normal. Pupils are equal, round, and reactive to light.  Neck: Normal range of motion. Neck supple.  Cardiovascular: Normal rate and regular rhythm.  Respiratory: Effort normal and breath sounds normal.  GI: Soft. Bowel sounds are normal.  Musculoskeletal:       Right hip: She  exhibits decreased range of motion, decreased strength, tenderness and bony tenderness.  Neurological: She is alert and oriented to person, place, and time.  Skin: Skin is warm and dry.  Psychiatric: She has a normal mood and affect.    Vital signs in last 24 hours: Temp:  [98.6 F (37 C)] 98.6 F (37 C) (03/05 1321) Pulse Rate:  [102] 102 (03/05 1321) Resp:  [20] 20 (03/05 1321) BP: (184)/(69) 184/69 (03/05  1321) SpO2:  [98 %] 98 % (03/05 1321) Weight:  [184 lb (83.5 kg)] 184 lb (83.5 kg) (03/05 1321)  Labs:   Estimated body mass index is 31.83 kg/m as calculated from the following:   Height as of 06/29/17: 5' 3.75" (1.619 m).   Weight as of this encounter: 184 lb (83.5 kg).   Imaging Review Plain radiographs demonstrate severe degenerative joint disease of the right hip(s). The bone quality appears to be good for age and reported activity level.  Assessment/Plan:  End stage arthritis, right hip(s)  The patient history, physical examination, clinical judgement of the provider and imaging studies are consistent with end stage degenerative joint disease of the right hip(s) and total hip arthroplasty is deemed medically necessary. The treatment options including medical management, injection therapy, arthroscopy and arthroplasty were discussed at length. The risks and benefits of total hip arthroplasty were presented and reviewed. The risks due to aseptic loosening, infection, stiffness, dislocation/subluxation,  thromboembolic complications and other imponderables were discussed.  The patient acknowledged the explanation, agreed to proceed with the plan and consent was signed. Patient is being admitted for inpatient treatment for surgery, pain control, PT, OT, prophylactic antibiotics, VTE prophylaxis, progressive ambulation and ADL's and discharge planning.The patient is planning to be discharged home with home health services

## 2017-07-12 NOTE — Anesthesia Postprocedure Evaluation (Signed)
Anesthesia Post Note  Patient: Jean Baldwin  Procedure(s) Performed: RIGHT TOTAL HIP ARTHROPLASTY ANTERIOR APPROACH (Right )     Patient location during evaluation: PACU Anesthesia Type: Spinal Level of consciousness: awake and alert Pain management: pain level controlled Vital Signs Assessment: post-procedure vital signs reviewed and stable Respiratory status: spontaneous breathing and respiratory function stable Cardiovascular status: blood pressure returned to baseline and stable Postop Assessment: spinal receding Anesthetic complications: no    Last Vitals:  Vitals:   07/12/17 2101 07/12/17 2121  BP: 140/65 (!) 149/61  Pulse: 82 79  Resp: 17   Temp: 36.9 C 36.9 C  SpO2: 97% 99%    Last Pain:  Vitals:   07/12/17 2121  TempSrc: Oral  PainSc: 4                  Lewie LoronJohn Yosselyn Tax

## 2017-07-12 NOTE — Anesthesia Procedure Notes (Signed)
Spinal  Patient location during procedure: OR Staffing Anesthesiologist: Nolon Nations, MD Performed: anesthesiologist  Preanesthetic Checklist Completed: patient identified, site marked, surgical consent, pre-op evaluation, timeout performed, IV checked, risks and benefits discussed and monitors and equipment checked Spinal Block Patient position: sitting Prep: DuraPrep Patient monitoring: heart rate, continuous pulse ox and blood pressure Approach: right paramedian Location: L2-3 Injection technique: single-shot Needle Needle type: Sprotte  Needle gauge: 24 G Needle length: 9 cm Additional Notes Expiration date of kit checked and confirmed. Patient tolerated procedure well, without complications.

## 2017-07-12 NOTE — Transfer of Care (Signed)
Immediate Anesthesia Transfer of Care Note  Patient: Jean Baldwin  Procedure(s) Performed: RIGHT TOTAL HIP ARTHROPLASTY ANTERIOR APPROACH (Right )  Patient Location: PACU  Anesthesia Type:Spinal  Level of Consciousness: awake, alert  and oriented  Airway & Oxygen Therapy: Patient Spontanous Breathing and Patient connected to nasal cannula oxygen  Post-op Assessment: Report given to RN and Post -op Vital signs reviewed and stable  Post vital signs: Reviewed and stable  Last Vitals:  Vitals:   07/12/17 1321 07/12/17 2021  BP: (!) 184/69 (!) 105/48  Pulse: (!) 102 76  Resp: 20 11  Temp: 37 C 36.7 C  SpO2: 98% 100%    Last Pain:  Vitals:   07/12/17 2021  TempSrc:   PainSc: 8       Patients Stated Pain Goal: 4 (07/12/17 1336)  Complications: No apparent anesthesia complications

## 2017-07-13 ENCOUNTER — Encounter (HOSPITAL_COMMUNITY): Payer: Self-pay | Admitting: Orthopaedic Surgery

## 2017-07-13 LAB — BASIC METABOLIC PANEL
Anion gap: 10 (ref 5–15)
BUN: 11 mg/dL (ref 6–20)
CHLORIDE: 105 mmol/L (ref 101–111)
CO2: 26 mmol/L (ref 22–32)
CREATININE: 0.73 mg/dL (ref 0.44–1.00)
Calcium: 9.1 mg/dL (ref 8.9–10.3)
GFR calc Af Amer: >60 mL/min (ref >60)
GFR calc non Af Amer: >60 mL/min (ref >60)
Glucose, Bld: 133 mg/dL — ABNORMAL HIGH (ref 65–99)
Potassium: 4.5 mmol/L (ref 3.5–5.1)
Sodium: 141 mmol/L (ref 135–145)

## 2017-07-13 LAB — CBC
HEMATOCRIT: 33.9 % — AB (ref 36.0–46.0)
HEMOGLOBIN: 10.8 g/dL — AB (ref 12.0–15.0)
MCH: 27.6 pg (ref 26.0–34.0)
MCHC: 31.9 g/dL (ref 30.0–36.0)
MCV: 86.7 fL (ref 78.0–100.0)
Platelets: 309 10*3/uL (ref 150–400)
RBC: 3.91 MIL/uL (ref 3.87–5.11)
RDW: 15.2 % (ref 11.5–15.5)
WBC: 12 10*3/uL — ABNORMAL HIGH (ref 4.0–10.5)

## 2017-07-13 NOTE — Evaluation (Signed)
Occupational Therapy Evaluation Patient Details Name: Jean Baldwin MRN: [id number] DOB: 02-21-1951 Today's Date: 07/13/2017    History of Present Illness Pt is s/p R THA Anterior approach   Clinical Impression   Pt admitted as above currently demonstrating deficits in her ability to perform LB ADL's and functional transfers (see OT problem list below). She should benefit from an additional acute OT visit for further education LB ADL's using a/e PRN and shower transfer with 3:1.     Follow Up Recommendations  No OT follow up;Supervision - Intermittent    Equipment Recommendations  3 in 1 bedside commode    Recommendations for Other Services       Precautions / Restrictions Restrictions Weight Bearing Restrictions: Yes RLE Weight Bearing: Weight bearing as tolerated      Mobility Bed Mobility Overal bed mobility: Needs Assistance Bed Mobility: Supine to Sit     Supine to sit: Supervision;HOB elevated     General bed mobility comments: Pt used LLE to assist with RLE when getting OOB. Increased time  Transfers Overall transfer level: Needs assistance Equipment used: Rolling walker (2 wheeled) Transfers: Sit to/from [employer] Sit to Stand: Min assist;From elevated surface Stand pivot transfers: Min assist       General transfer comment: VC's for safety and sequencing with RW    Balance Overall balance assessment: No apparent balance deficits (not formally assessed)                                         ADL either performed or assessed with clinical judgement   ADL Overall ADL's : Needs assistance/impaired Eating/Feeding: Independent;Sitting   Grooming: Wash/dry hands;Wash/dry face;Oral care;Applying deodorant;Standing;Min guard   Upper Body Bathing: Sitting;Supervision/ safety   Lower Body Bathing: Sit to/from stand;Min guard   Upper Body Dressing : Set up;Sitting   Lower Body Dressing: Minimal assistance;Sit to/from  stand   Toilet Transfer: Min guard;BSC;Ambulation;RW;Cueing for sequencing   Toileting- Architect and Hygiene: Min guard;Sit to/from stand;Sitting/lateral lean   Tub/ Shower Transfer: Museum/gallery curator;Ambulation;3 in 1;Minimal assistance;Cueing for sequencing(Simulated shower transfer usinf 3:1 & stepping over towel roll in bathroom)   Functional mobility during ADLs: Min guard;Rolling walker;Cueing for safety;Cueing for sequencing General ADL Comments: Pt was assessed by OT followed by participation in ADL retraining session with focus on standing at sink for grooming, pt education re: homemaking and safety (remove area/throw rugs, place frequently used items at counter top height, use 3:1 over toilet and shower seat in shower stall, LB dressing techniques etc); toilet transfer all aspects. Pt plans to d/c home with intermittent family assist PRN. Pt may benefit from 1 additional shower transfer and LB ADL treatment session. to assist with maximizing independence prior to d/c home.     Vision Baseline Vision/History: Wears glasses(Contacts) Patient Visual Report: No change from baseline       Perception     Praxis      Pertinent Vitals/Pain Pain Assessment: 0-10 Pain Score: 5  Pain Descriptors / Indicators: Sore;Guarding Pain Intervention(s): Limited activity within patient's tolerance;Monitored during session;Premedicated before session;Repositioned     Hand Dominance Right   Extremity/Trunk Assessment Upper Extremity Assessment Upper Extremity Assessment: Overall WFL for tasks assessed   Lower Extremity Assessment Lower Extremity Assessment: Defer to PT evaluation       Communication Communication Communication: No difficulties   Cognition Arousal/Alertness: Awake/alert Behavior During Therapy:  WFL for tasks assessed/performed Overall Cognitive Status: Within Functional Limits for tasks assessed                                      General Comments       Exercises     Shoulder Instructions      Home Living Family/patient expects to be discharged to:: Private residence Living Arrangements: Spouse/significant other Available Help at Discharge: Family;Available PRN/intermittently Type of Home: House Home Access: Stairs to enter Entrance Stairs-Number of Steps: 5 STE  Entrance Stairs-Rails: Left Home Layout: One level     Bathroom Shower/Tub: Walk-in shower;Door   Foot LockerBathroom Toilet: Standard     Home Equipment: Environmental consultantWalker - 2 wheels;Walker - standard;Bedside commode;Shower seat;Grab bars - tub/shower;Hand held shower head          Prior Functioning/Environment Level of Independence: Independent                 OT Problem List: Pain;Decreased activity tolerance;Decreased knowledge of use of DME or AE      OT Treatment/Interventions: Self-care/ADL training;DME and/or AE instruction;Therapeutic activities;Patient/family education    OT Goals(Current goals can be found in the care plan section) Acute Rehab OT Goals Patient Stated Goal: Home with family  OT Goal Formulation: With patient Time For Goal Achievement: 07/27/17 Potential to Achieve Goals: Good  OT Frequency: Min 2X/week   Barriers to D/C:            Co-evaluation              AM-PAC PT "6 Clicks" Daily Activity     Outcome Measure Help from another person eating meals?: None Help from another person taking care of personal grooming?: A Little Help from another person toileting, which includes using toliet, bedpan, or urinal?: A Little Help from another person bathing (including washing, rinsing, drying)?: A Little Help from another person to put on and taking off regular upper body clothing?: None Help from another person to put on and taking off regular lower body clothing?: A Lot 6 Click Score: 19   End of Session Equipment Utilized During Treatment: Rolling walker;Gait belt  Activity Tolerance: Patient tolerated  treatment well Patient left: in chair;with call bell/phone within reach;with family/visitor present  OT Visit Diagnosis: Other abnormalities of gait and mobility (R26.89);Pain Pain - Right/Left: Right Pain - part of body: Hip                Time: [id number] OT Time Calculation (min): 38 min Charges:  OT General Charges $OT Visit: 1 Visit OT Evaluation $OT Eval Low Complexity: 1 Low(10 min) OT Treatments $Self Care/Home Management : 23-37 mins(28 min) G-Codes:      Alm BustardBarnhill, Renada Cronin Beth Dixon, OTR/L 07/13/2017, 12:50 PM

## 2017-07-13 NOTE — Evaluation (Signed)
Physical Therapy Evaluation Patient Details Name: Jean Baldwin MRN: [id number] DOB: 1951/05/07 Today's Date: 07/13/2017   History of Present Illness  Pt is s/p R THA Anterior approach 3/05  Clinical Impression  Patient is s/p above surgery resulting in functional limitations due to the deficits listed below (see PT Problem List). PTA, pt independent living with husband in home with stairs to enter. Upon eval pt presents with high levels of post op pain, weakness, and nausea that has limited her mobility. Currently min guard level for OOB mobility and ambulating short distances in hallway with poor mechanics. Next PT visit will progress therex and activity tolerance. Stair training tomorrow.  Patient will benefit from skilled PT to increase their independence and safety with mobility to allow discharge to the venue listed below.       Follow Up Recommendations Home health PT;Supervision for mobility/OOB    Equipment Recommendations  None recommended by PT    Recommendations for Other Services       Precautions / Restrictions Precautions Precautions: Fall Restrictions Weight Bearing Restrictions: Yes RLE Weight Bearing: Weight bearing as tolerated      Mobility  Bed Mobility Overal bed mobility: Needs Assistance Bed Mobility: Sit to Supine     Supine to sit: Supervision Sit to supine: Supervision   General bed mobility comments: Pt used LLE to assist with RLE when getting over EOB. Increased time  Transfers Overall transfer level: Needs assistance Equipment used: Rolling walker (2 wheeled) Transfers: Sit to/from [employer] Sit to Stand: Supervision Stand pivot transfers: Supervision       General transfer comment: Supervision for safety cues for hand placement.  Ambulation/Gait Ambulation/Gait assistance: Min guard;Supervision Ambulation Distance (Feet): 75 Feet Assistive device: Rolling walker (2 wheeled) Gait Pattern/deviations: Step-to  pattern;Antalgic;Decreased stance time - right Gait velocity: decreased   General Gait Details: Patient progressed to supervision, cues for heel strike step length and use of RW. Pt limited by nausea.   Stairs            Wheelchair Mobility    Modified Rankin (Stroke Patients Only)       Balance Overall balance assessment: Needs assistance   Sitting balance-Leahy Scale: Good       Standing balance-Leahy Scale: Fair                               Pertinent Vitals/Pain Pain Assessment: Faces Faces Pain Scale: Hurts even more Pain Location: R hip Pain Descriptors / Indicators: Sore;Guarding Pain Intervention(s): Limited activity within patient's tolerance;Monitored during session;Premedicated before session;Repositioned    Home Living Family/patient expects to be discharged to:: Private residence Living Arrangements: Spouse/significant other Available Help at Discharge: Family;Available PRN/intermittently Type of Home: House Home Access: Stairs to enter Entrance Stairs-Rails: Left Entrance Stairs-Number of Steps: 5 STE  Home Layout: One level Home Equipment: Walker - 2 wheels;Walker - standard;Bedside commode;Shower seat;Grab bars - tub/shower;Hand held shower head      Prior Function Level of Independence: Independent               Hand Dominance   Dominant Hand: Right    Extremity/Trunk Assessment   Upper Extremity Assessment Upper Extremity Assessment: Defer to OT evaluation(LLE strength 4/5)    Lower Extremity Assessment Lower Extremity Assessment: RLE deficits/detail RLE Deficits / Details: limitations in strength and ROM consistent with above procedure        Communication   Communication: No difficulties  Cognition Arousal/Alertness: Awake/alert Behavior During Therapy: WFL for tasks assessed/performed Overall Cognitive Status: Within Functional Limits for tasks assessed                                         General Comments      Exercises Total Joint Exercises Ankle Circles/Pumps: 20 reps Quad Sets: 10 reps Hip ABduction/ADduction: 10 reps   Assessment/Plan    PT Assessment Patient needs continued PT services  PT Problem List Decreased strength;Decreased range of motion;Decreased activity tolerance;Decreased mobility;Decreased balance;Decreased knowledge of use of DME;Pain       PT Treatment Interventions DME instruction;Gait training;Stair training;Functional mobility training;Therapeutic exercise;Therapeutic activities;Balance training    PT Goals (Current goals can be found in the Care Plan section)  Acute Rehab PT Goals Patient Stated Goal: Home with family  PT Goal Formulation: With patient Time For Goal Achievement: 07/20/17 Potential to Achieve Goals: Good    Frequency 7X/week   Barriers to discharge        Co-evaluation               AM-PAC PT "6 Clicks" Daily Activity  Outcome Measure Difficulty turning over in bed (including adjusting bedclothes, sheets and blankets)?: A Little Difficulty moving from lying on back to sitting on the side of the bed? : A Little Difficulty sitting down on and standing up from a chair with arms (e.g., wheelchair, bedside commode, etc,.)?: A Little Help needed moving to and from a bed to chair (including a wheelchair)?: A Little Help needed walking in hospital room?: A Little Help needed climbing 3-5 steps with a railing? : A Little 6 Click Score: 18    End of Session Equipment Utilized During Treatment: Gait belt Activity Tolerance: Patient tolerated treatment well Patient left: in bed;with call bell/phone within reach Nurse Communication: Mobility status PT Visit Diagnosis: Unsteadiness on feet (R26.81);Other abnormalities of gait and mobility (R26.89);Muscle weakness (generalized) (M62.81);Pain Pain - Right/Left: Right Pain - part of body: Hip    Time: [id number] PT Time Calculation (min) (ACUTE ONLY): 28  min   Charges:   PT Evaluation $PT Eval Low Complexity: 1 Low PT Treatments $Gait Training: 8-22 mins   PT G Codes:        Etta GrandchildSean Clydene Burack, PT, DPT Acute Rehab Services Pager: [phone]    Etta GrandchildSean  [facility] 07/13/2017, 3:51 PM

## 2017-07-13 NOTE — Progress Notes (Signed)
Subjective: 1 Day Post-Op Procedure(s) (LRB): RIGHT TOTAL HIP ARTHROPLASTY ANTERIOR APPROACH (Right) Patient reports pain as moderate.    Objective: Vital signs in last 24 hours: Temp:  [98 F (36.7 C)-98.6 F (37 C)] 98.1 F (36.7 C) (03/06 0555) Pulse Rate:  [64-102] 100 (03/06 0555) Resp:  [11-20] 16 (03/06 0555) BP: (105-184)/(48-69) 137/66 (03/06 0555) SpO2:  [93 %-100 %] 96 % (03/06 0555) Weight:  [184 lb (83.5 kg)] 184 lb (83.5 kg) (03/05 1321)  Intake/Output from previous day: 03/05 0701 - 03/06 0700 In: 1200 [I.V.:1000] Out: 550 [Urine:300; Blood:250] Intake/Output this shift: No intake/output data recorded.  No results for input(s): HGB in the last 72 hours. No results for input(s): WBC, RBC, HCT, PLT in the last 72 hours. No results for input(s): NA, K, CL, CO2, BUN, CREATININE, GLUCOSE, CALCIUM in the last 72 hours. No results for input(s): LABPT, INR in the last 72 hours.  Sensation intact distally Intact pulses distally Dorsiflexion/Plantar flexion intact Incision: scant drainage  Assessment/Plan: 1 Day Post-Op Procedure(s) (LRB): RIGHT TOTAL HIP ARTHROPLASTY ANTERIOR APPROACH (Right) Up with therapy  Kathryne HitchChristopher Y Roselynne Lortz 07/13/2017, 7:33 AM

## 2017-07-13 NOTE — Plan of Care (Signed)
  Nutrition: Adequate nutrition will be maintained 07/13/2017 1127 - Progressing by Darrow BussingArcilla, Tryston Gilliam M, RN   Elimination: Will not experience complications related to bowel motility 07/13/2017 1127 - Progressing by Darrow BussingArcilla, Helena Sardo M, RN   Pain Managment: General experience of comfort will improve 07/13/2017 1127 - Progressing by Darrow BussingArcilla, Demondre Aguas M, RN   Safety: Ability to remain free from injury will improve 07/13/2017 1127 - Progressing by Darrow BussingArcilla, Devonn Giampietro M, RN

## 2017-07-13 NOTE — Op Note (Signed)
[name], DOUTHITT NO.:  [id number]  MEDICAL RECORD NO.:  [id number]  LOCATION:                                 FACILITY:  PHYSICIAN:  Jean Baldwin, M.D.DATE OF BIRTH:  04/17/1951  DATE OF PROCEDURE:  07/12/2017 DATE OF DISCHARGE:                              OPERATIVE REPORT   PREOPERATIVE DIAGNOSIS:  Primary osteoarthritis and degenerative joint disease, right hip.  POSTOPERATIVE DIAGNOSIS:  Primary osteoarthritis and degenerative joint disease, right hip.  PROCEDURE:  Right total hip arthroplasty through direct anterior approach.  IMPLANTS:  DePuy Sector Gription acetabular component size 54, size 36 +0 polyethylene liner, size 13 Corail femoral component with varus offset, size 36 +1.5 ceramic hip ball.  SURGEON:  Jean Baldwin, M.D.  ASSISTANT:  Jean Lerner, PA-C.  ANESTHESIA:  Spinal.  ANTIBIOTICS:  IV Ancef 2 g.  BLOOD LOSS:  250 mL.  COMPLICATIONS:  None.  INDICATIONS:  Jean Baldwin is a very pleasant 67 year old female with severe well documented debilitating arthritis involving her right hip. She has tried and failed all forms of conservative treatment.  Her x-ray showed complete loss of the joint space.  At this point, she does wish to proceed with a total hip arthroplasty through direct anterior approach.  She understands the risk of acute blood loss anemia, nerve and vessel injury, fracture, infection, dislocation, DVT.  She understands our goals are to decrease pain, improve mobility, and overall improved quality of life.  PROCEDURE DESCRIPTION:  After informed consent was obtained, appropriate right hip was marked.  She was brought to the operating room where spinal anesthesia was obtained while she was on her stretcher.  Traction boots were placed on both her feet.  She was placed supine on the Hana fracture table with the perineal post in place and both legs in InLine skeletal traction devices, but  no traction applied.  Her right operative hip was then prepped and draped with DuraPrep and sterile drapes.  Time-out was called and she was identified as correct patient and correct right hip.  I then made incision just inferior and posterior to the anterior superior iliac spine and carried this obliquely down the leg.  We dissected down to tensor fascia lata muscle.  The tensor fascia was then divided longitudinally to proceed with a direct anterior approach to the hip. We identified and cauterized circumflex vessels and then identified the hip capsule.  I opened up the hip capsule in an L-type format, finding a large joint effusion and significant arthritis throughout her hip.  I removed remnants of acetabular labrum and other debris and placed a bent Hohmann over the medial acetabular rim.  We placed Cobra retractor around the medial and lateral femoral neck and then made our femoral neck cut with an oscillating saw and completed this with an osteotome. We removed the femoral head in its entirety.  We then began reaming under direct visualization from a size 43 reamer going in stepwise increments up to a size 53 with all reamers under direct visualization, the last reamer under direct fluoroscopy, so we could obtain our depth of reaming, our inclination, and anteversion.  Once we  were pleased with this, we placed the real DePuy Sector Gription acetabular component size 54 and a 36 +0 polyethylene liner for a size 54 acetabular component. Attention was then turned to the femur.  With the leg externally rotated to 120 degrees, extended and adducted, we were able to place a Mueller retractor medially and a Hohmann retractor behind the greater trochanter.  We released the lateral joint capsule and used a box cutting osteotome to enter the femoral canal and a rongeur to lateralize.  We then began broaching using size 8 broach using the Corail broaching system going up to a size 13.  With a  size 13, we trialed a varus offset femoral neck based off her anatomy and x-rays and then a 36 +1.5 hip ball.  We brought the leg back over and up with traction and internal rotation reducing the pelvis.  We were pleased with leg length, offset, range of motion, and stability.  We then dislocated the hip and removed the trial components.  We were able to place the real Corail femoral component with varus offset size 13 and the real 36 +1.5 ceramic hip ball and reduced this in the acetabulum, and again we were pleased with stability.  We then irrigated the soft tissue with normal saline solution using pulsatile lavage.  We closed the joint capsule with interrupted #1 Ethibond suture, followed by running #1 in Vicryl tensor fascia, 0 Vicryl in the deep tissue, 2-0 Vicryl in the subcutaneous tissue, interrupted staples on the skin. Xeroform and Aquacel dressing were applied.  She was taken off the Hana table, taken to the recovery room in stable condition.  All final counts were correct.  There were no complications noted.  Of note, Jean LernerLindsey Stanbery, PA-C, assisted in the whole case and her assistance was crucial for facilitating all aspects of this case.     Jean Baldwin, M.D.     CYB/MEDQ  D:  07/12/2017  T:  07/12/2017  Job:  [id number]

## 2017-07-13 NOTE — Progress Notes (Signed)
Physical Therapy Treatment Patient Details Name: Jean Baldwin MRN: [id number] DOB: 05-Mar-1951 Today's Date: 07/13/2017    History of Present Illness Pt is s/p R THA Anterior approach    PT Comments    Pt limited by pain this session. Focused session on therex and ensuring patient had no questions. Discussed plan tomorrow with patient for increased activity and stair training. Pt reports no questions or concerns from today and looks forward to getting pain under control for further participation.   Follow Up Recommendations  Home health PT;Supervision for mobility/OOB     Equipment Recommendations  None recommended by PT    Recommendations for Other Services       Precautions / Restrictions Precautions Precautions: Fall Restrictions Weight Bearing Restrictions: Yes RLE Weight Bearing: Weight bearing as tolerated    Mobility  Bed Mobility Overal bed mobility: Needs Assistance Bed Mobility: Sit to Supine     Supine to sit: Supervision Sit to supine: Supervision   General bed mobility comments: Pt used LLE to assist with RLE when getting over EOB. Increased time  Transfers Overall transfer level: Needs assistance Equipment used: Rolling walker (2 wheeled) Transfers: Sit to/from [employer] Sit to Stand: Supervision Stand pivot transfers: Supervision       General transfer comment: Supervision for safety cues for hand placement.  Ambulation/Gait Ambulation/Gait assistance: Min guard;Supervision Ambulation Distance (Feet): 75 Feet Assistive device: Rolling walker (2 wheeled) Gait Pattern/deviations: Step-to pattern;Antalgic;Decreased stance time - right Gait velocity: decreased   General Gait Details: Patient progressed to supervision, cues for heel strike step length and use of RW. Pt limited by nausea.    Stairs            Wheelchair Mobility    Modified Rankin (Stroke Patients Only)       Balance Overall balance assessment: Needs  assistance   Sitting balance-Leahy Scale: Good       Standing balance-Leahy Scale: Fair                              Cognition Arousal/Alertness: Awake/alert Behavior During Therapy: WFL for tasks assessed/performed Overall Cognitive Status: Within Functional Limits for tasks assessed                                        Exercises Total Joint Exercises Ankle Circles/Pumps: 20 reps Quad Sets: 10 reps Towel Squeeze: 10 reps Heel Slides: 10 reps Hip ABduction/ADduction: 10 reps Straight Leg Raises: 10 reps(contralateral ) Knee Flexion: 10 reps Marching in Standing: 10 reps Standing Hip Extension: 10 reps    General Comments        Pertinent Vitals/Pain Pain Assessment: Faces Faces Pain Scale: Hurts even more Pain Location: R hip Pain Descriptors / Indicators: Sore;Guarding Pain Intervention(s): Limited activity within patient's tolerance;Monitored during session;Premedicated before session;Repositioned    Home Living Family/patient expects to be discharged to:: Private residence Living Arrangements: Spouse/significant other Available Help at Discharge: Family;Available PRN/intermittently Type of Home: House Home Access: Stairs to enter Entrance Stairs-Rails: Left Home Layout: One level Home Equipment: Walker - 2 wheels;Walker - standard;Bedside commode;Shower seat;Grab bars - tub/shower;Hand held shower head      Prior Function Level of Independence: Independent          PT Goals (current goals can now be found in the care plan section) Acute Rehab PT Goals Patient  Stated Goal: Home with family  PT Goal Formulation: With patient Time For Goal Achievement: 07/20/17 Potential to Achieve Goals: Good    Frequency    7X/week      PT Plan Current plan remains appropriate    Co-evaluation              AM-PAC PT "6 Clicks" Daily Activity  Outcome Measure  Difficulty turning over in bed (including adjusting  bedclothes, sheets and blankets)?: A Little Difficulty moving from lying on back to sitting on the side of the bed? : A Little Difficulty sitting down on and standing up from a chair with arms (e.g., wheelchair, bedside commode, etc,.)?: A Little Help needed moving to and from a bed to chair (including a wheelchair)?: A Little Help needed walking in hospital room?: A Little Help needed climbing 3-5 steps with a railing? : A Little 6 Click Score: 18    End of Session Equipment Utilized During Treatment: Gait belt Activity Tolerance: Patient tolerated treatment well Patient left: in bed;with call bell/phone within reach Nurse Communication: Mobility status PT Visit Diagnosis: Unsteadiness on feet (R26.81);Other abnormalities of gait and mobility (R26.89);Muscle weakness (generalized) (M62.81);Pain Pain - Right/Left: Right Pain - part of body: Hip     Time: 1730-1750 PT Time Calculation (min) (ACUTE ONLY): 20 min  Charges:  $Gait Training: 8-22 mins $Therapeutic Exercise: 8-22 mins                    G Codes:       Etta GrandchildSean Kaylanni Ezelle, PT, DPT Acute Rehab Services Pager: [phone]     Etta GrandchildSean  Keya Wynes 07/13/2017, 5:46 PM

## 2017-07-14 ENCOUNTER — Other Ambulatory Visit: Payer: Self-pay

## 2017-07-14 MED ORDER — ONDANSETRON HCL 4 MG PO TABS: 4.0000 mg | ORAL_TABLET | Freq: Four times a day (QID) | ORAL | 0 refills | Status: DC | PRN

## 2017-07-14 MED ORDER — TRAMADOL HCL 50 MG PO TABS: 100.0000 mg | ORAL_TABLET | Freq: Four times a day (QID) | ORAL | 0 refills | Status: DC | PRN

## 2017-07-14 MED ORDER — AMLODIPINE BESYLATE 5 MG PO TABS
5.0000 mg | ORAL_TABLET | Freq: Every day | ORAL | Status: DC
  Administered 2017-07-14 – 2017-07-15 (×2): 5 mg via ORAL
  Filled 2017-07-14 (×2): qty 1

## 2017-07-14 MED ORDER — ASPIRIN 81 MG PO CHEW: 81.0000 mg | CHEWABLE_TABLET | Freq: Two times a day (BID) | ORAL | 0 refills | Status: AC

## 2017-07-14 MED ORDER — METHOCARBAMOL 500 MG PO TABS: 500.0000 mg | ORAL_TABLET | Freq: Four times a day (QID) | ORAL | 1 refills | Status: DC | PRN

## 2017-07-14 MED ORDER — ASPIRIN 81 MG PO CHEW: 81.0000 mg | CHEWABLE_TABLET | Freq: Two times a day (BID) | ORAL | 0 refills | Status: DC

## 2017-07-14 NOTE — Discharge Instructions (Signed)

## 2017-07-14 NOTE — Evaluation (Signed)
Physical Therapy Evaluation Patient Details Name: Jean Baldwin MRN: [id number] DOB: [birth year] Today's Date: 07/14/2017   History of Present Illness  Pt is 67 y.o. female s/p R THA direct anterior approach. PMHx: Arthritis, HTN, Hypothyroism.  Clinical Impression  Patient doing well this afternoon. Session focused on stair training, able to perform with close supervision this visit. Will reinforce stair training tomorrow AM with husband present to ensure safe guarding. Pt agreeable to plan. From mobility standpoint will be safe to return home tomorrow once medically cleared.       Follow Up Recommendations Home health PT;Supervision for mobility/OOB    Equipment Recommendations  None recommended by PT    Recommendations for Other Services       Precautions / Restrictions Precautions Precautions: Fall Restrictions Weight Bearing Restrictions: Yes RLE Weight Bearing: Weight bearing as tolerated      Mobility  Bed Mobility Overal bed mobility: Needs Assistance Bed Mobility: Sit to Supine     Supine to sit: Supervision     General bed mobility comments: Pt OOB in chair upon arrival  Transfers Overall transfer level: Needs assistance Equipment used: Rolling walker (2 wheeled) Transfers: Sit to/from Stand Sit to Stand: Supervision Stand pivot transfers: Supervision       General transfer comment: Supervision for safety, no physical assist required. Good hand placement and technique  Ambulation/Gait Ambulation/Gait assistance: Supervision Ambulation Distance (Feet): 50 Feet Assistive device: Rolling walker (2 wheeled) Gait Pattern/deviations: Antalgic;Decreased stance time - right;Step-through pattern;Step-to pattern Gait velocity: decreased   General Gait Details: Patient with step through gait, supervision level at this time  Stairs Stairs: Yes Stairs assistance: Min guard Stair Management: One rail Left;Forwards;Backwards;With walker Number of Stairs:  12 General stair comments: cues for sequencing, BUE support on 1 rail left, no lob good balance patient able to safely perform with close supervisoin min guard. also trialed backwards with RW, patient can do but prefers forwards with 1 rail. will reinforce tomorrow with husband present before safe return home.  Wheelchair Mobility    Modified Rankin (Stroke Patients Only)       Balance Overall balance assessment: Needs assistance Sitting-balance support: Feet supported;No upper extremity supported Sitting balance-Leahy Scale: Good     Standing balance support: No upper extremity supported;During functional activity Standing balance-Leahy Scale: Fair Standing balance comment: for static standing                             Pertinent Vitals/Pain Pain Assessment: 0-10 Pain Score: 7  Pain Location: R hip Pain Descriptors / Indicators: Sore Pain Intervention(s): Limited activity within patient's tolerance;Monitored during session;Premedicated before session;Repositioned    Home Living                        Prior Function                 Hand Dominance        Extremity/Trunk Assessment                Communication      Cognition Arousal/Alertness: Awake/alert Behavior During Therapy: WFL for tasks assessed/performed Overall Cognitive Status: Within Functional Limits for tasks assessed                                        General Comments  Exercises     Assessment/Plan    PT Assessment    PT Problem List         PT Treatment Interventions      PT Goals (Current goals can be found in the Care Plan section)  Acute Rehab PT Goals Patient Stated Goal: Home with family  PT Goal Formulation: With patient Time For Goal Achievement: 07/20/17 Potential to Achieve Goals: Good    Frequency 7X/week   Barriers to discharge        Co-evaluation               AM-PAC PT "6 Clicks" Daily Activity   Outcome Measure Difficulty turning over in bed (including adjusting bedclothes, sheets and blankets)?: A Little Difficulty moving from lying on back to sitting on the side of the bed? : A Little Difficulty sitting down on and standing up from a chair with arms (e.g., wheelchair, bedside commode, etc,.)?: A Little Help needed moving to and from a bed to chair (including a wheelchair)?: A Little Help needed walking in hospital room?: A Little Help needed climbing 3-5 steps with a railing? : A Little 6 Click Score: 18    End of Session Equipment Utilized During Treatment: Gait belt Activity Tolerance: Patient tolerated treatment well Patient left: in bed;with call bell/phone within reach Nurse Communication: Mobility status PT Visit Diagnosis: Unsteadiness on feet (R26.81);Other abnormalities of gait and mobility (R26.89);Muscle weakness (generalized) (M62.81);Pain Pain - Right/Left: Right Pain - part of body: Hip    Time: 9528-41321658-1720 PT Time Calculation (min) (ACUTE ONLY): 22 min   Charges:     PT Treatments $Gait Training: 8-22 mins   PT G Codes:       Etta GrandchildSean Laquanta Hummel, PT, DPT Acute Rehab Services Pager: [phone]    Etta GrandchildSean  Ashby Leflore 07/14/2017, 5:26 PM

## 2017-07-14 NOTE — Progress Notes (Signed)
Occupational Therapy Treatment Patient Details Name: Jean Baldwin MRN: [id number] DOB: Aug 06, 1950 Today's Date: 07/14/2017    History of present illness Pt is 67 y.o. female s/p R THA direct anterior approach. PMHx: Arthritis, HTN, Hypothyroism.   OT comments  Pt making good progress toward OT goals. Able to perform simulated shower transfer and toilet transfer with supervision today. Educated on LB ADL technique using compensatory strategies; pt able to return demo with supervision. D/c plan remains appropriate. Will continue to follow acutely.   Follow Up Recommendations  No OT follow up;Supervision - Intermittent    Equipment Recommendations  3 in 1 bedside commode    Recommendations for Other Services      Precautions / Restrictions Precautions Precautions: Fall Restrictions Weight Bearing Restrictions: Yes RLE Weight Bearing: Weight bearing as tolerated       Mobility Bed Mobility               General bed mobility comments: Pt OOB in chair upon arrival  Transfers Overall transfer level: Needs assistance Equipment used: Rolling walker (2 wheeled) Transfers: Sit to/from Stand Sit to Stand: Supervision         General transfer comment: Supervision for safety, no physical assist required. Good hand placement and technique    Balance Overall balance assessment: Needs assistance Sitting-balance support: Feet supported;No upper extremity supported Sitting balance-Leahy Scale: Good     Standing balance support: No upper extremity supported;During functional activity Standing balance-Leahy Scale: Fair Standing balance comment: for static standing                           ADL either performed or assessed with clinical judgement   ADL Overall ADL's : Needs assistance/impaired     Grooming: Supervision/safety;Standing;Wash/dry hands       Lower Body Bathing: Supervison/ safety;Sit to/from stand Lower Body Bathing Details (indicate cue type  and reason): Educated on use of compensatory strategies for lower body bathing.      Lower Body Dressing: Supervision/safety;Sit to/from stand Lower Body Dressing Details (indicate cue type and reason): Educated on compensatory strategies for LB dressing. Pt. demonstrated understanding.  Toilet Transfer: Supervision/safety;Ambulation;BSC;RW Toilet Transfer Details (indicate cue type and reason): Pt. was able to safely transfer to toilet using walker with supervision.  Toileting- Clothing Manipulation and Hygiene: Supervision/safety;Sit to/from stand Toileting - Clothing Manipulation Details (indicate cue type and reason): Pt. was able to safely and effectively perform peri care with supervision Tub/ Shower Transfer: Supervision/safety;Walk-in shower;Ambulation;Rolling walker Tub/Shower Transfer Details (indicate cue type and reason): Pt. was educated on safe shower transfer techniques with walker placing affected LE in shower first and out last. Pt. was able to demonstrate understanding of compensatory tenchinque with supervision. Discussed need for supervision initially; pt verbalized understanding.  Functional mobility during ADLs: Supervision/safety;Rolling walker       Vision       Perception     Praxis      Cognition Arousal/Alertness: Awake/alert Behavior During Therapy: WFL for tasks assessed/performed Overall Cognitive Status: Within Functional Limits for tasks assessed                                          Exercises     Shoulder Instructions       General Comments      Pertinent Vitals/ Pain       Pain Assessment:  0-10 Pain Score: 7  Pain Location: R hip Pain Descriptors / Indicators: Sore Pain Intervention(s): Monitored during session;Ice applied  Home Living Family/patient expects to be discharged to:: Private residence Living Arrangements: Spouse/significant other                                      Prior  Functioning/Environment              Frequency  Min 2X/week        Progress Toward Goals  OT Goals(current goals can now be found in the care plan section)  Progress towards OT goals: Progressing toward goals  Acute Rehab OT Goals Patient Stated Goal: Home with family  OT Goal Formulation: With patient  Plan Discharge plan remains appropriate    Co-evaluation                 AM-PAC PT "6 Clicks" Daily Activity     Outcome Measure   Help from another person eating meals?: None Help from another person taking care of personal grooming?: A Little Help from another person toileting, which includes using toliet, bedpan, or urinal?: A Little Help from another person bathing (including washing, rinsing, drying)?: A Little Help from another person to put on and taking off regular upper body clothing?: A Little Help from another person to put on and taking off regular lower body clothing?: None 6 Click Score: 20    End of Session Equipment Utilized During Treatment: Rolling walker  OT Visit Diagnosis: Other abnormalities of gait and mobility (R26.89);Pain Pain - Right/Left: Right Pain - part of body: Hip   Activity Tolerance Patient tolerated treatment well   Patient Left in chair;with call bell/phone within reach;with family/visitor present   Nurse Communication          Time: 1191-47821345-1402 OT Time Calculation (min): 17 min  Charges: OT General Charges $OT Visit: 1 Visit OT Treatments $Self Care/Home Management : 8-22 mins  Jean Baldwin, M.S., Jean Baldwin Pager: [phone]   Jean Baldwin 07/14/2017, 2:16 PM

## 2017-07-14 NOTE — Care Management Note (Signed)
Case Management Note  Patient Details  Name: Monia SabalKay J Garczynski MRN: [id number] Date of Birth: 02/10/1951  Subjective/Objective: 67 yr old female s/p right total hip arthroplasty.                   Action/Plan: Case manager spoke with patient concerning discharge plan and DME. Patient was preoperatively setup with [facility], no changes. She has DME. Will have support from family and friends at discharge.    Expected Discharge Date:   07/15/17              Expected Discharge Plan:  Home w Home Health Services  In-House Referral:  NA  Discharge planning Services  CM Consult  Post Acute Care Choice:  Home Health Choice offered to:  Patient  DME Arranged:  (has RW and 3in 1) DME Agency:  NA  HH Arranged:  PT HH Agency:  [facility] ([facility]  Status of Service:  Completed, signed off  If discussed at [employer] Meetings, dates discussed:    Additional Comments:  Durenda GuthrieBrady, Cristine Daw Naomi, RN 07/14/2017, 3:14 PM

## 2017-07-14 NOTE — Progress Notes (Signed)
Physical Therapy Treatment Patient Details Name: Jean Baldwin MRN: [id number] DOB: 02/08/1951 Today's Date: 07/14/2017    History of Present Illness Pt is s/p R THA Anterior approach    PT Comments    Patient progressing well today with therapy. Increased ambulation distance and mechanics, as well as level of independence with OOB mobility. Plan to introduce stair training this afternoon, pt agreeable.     Follow Up Recommendations  Home health PT;Supervision for mobility/OOB     Equipment Recommendations  None recommended by PT    Recommendations for Other Services       Precautions / Restrictions Precautions Precautions: Fall Restrictions Weight Bearing Restrictions: Yes RLE Weight Bearing: Weight bearing as tolerated    Mobility  Bed Mobility Overal bed mobility: Needs Assistance Bed Mobility: Sit to Supine     Supine to sit: Supervision Sit to supine: Supervision   General bed mobility comments: no longer using UE support to assist limb.   Transfers Overall transfer level: Needs assistance Equipment used: Rolling walker (2 wheeled) Transfers: Sit to/from [employer] CorporationStand;Stand Pivot Transfers Sit to Stand: Supervision Stand pivot transfers: Supervision       General transfer comment: Supervision for safety cues for hand placement.  Ambulation/Gait Ambulation/Gait assistance: Supervision Ambulation Distance (Feet): 150 Feet Assistive device: Rolling walker (2 wheeled) Gait Pattern/deviations: Antalgic;Decreased stance time - right;Step-through pattern;Step-to pattern Gait velocity: decreased   General Gait Details: Patient progressing step through gait this visit. progressed distance and fluiditiy    Information systems managertairs            Wheelchair Mobility    Modified Rankin (Stroke Patients Only)       Balance Overall balance assessment: Needs assistance   Sitting balance-Leahy Scale: Good       Standing balance-Leahy Scale: Fair                              Cognition Arousal/Alertness: Awake/alert Behavior During Therapy: WFL for tasks assessed/performed Overall Cognitive Status: Within Functional Limits for tasks assessed                                        Exercises      General Comments        Pertinent Vitals/Pain Pain Assessment: Faces Faces Pain Scale: Hurts even more Pain Location: R hip Pain Descriptors / Indicators: Sore;Guarding Pain Intervention(s): Limited activity within patient's tolerance;Monitored during session;Premedicated before session;Repositioned    Home Living                      Prior Function            PT Goals (current goals can now be found in the care plan section) Acute Rehab PT Goals Patient Stated Goal: Home with family  PT Goal Formulation: With patient Time For Goal Achievement: 07/20/17 Potential to Achieve Goals: Good    Frequency    7X/week      PT Plan Current plan remains appropriate    Co-evaluation              AM-PAC PT "6 Clicks" Daily Activity  Outcome Measure  Difficulty turning over in bed (including adjusting bedclothes, sheets and blankets)?: A Little Difficulty moving from lying on back to sitting on the side of the bed? : A Little Difficulty sitting down on and standing up  from a chair with arms (e.g., wheelchair, bedside commode, etc,.)?: A Little Help needed moving to and from a bed to chair (including a wheelchair)?: A Little Help needed walking in hospital room?: A Little Help needed climbing 3-5 steps with a railing? : A Little 6 Click Score: 18    End of Session Equipment Utilized During Treatment: Gait belt Activity Tolerance: Patient tolerated treatment well Patient left: in bed;with call bell/phone within reach Nurse Communication: Mobility status PT Visit Diagnosis: Unsteadiness on feet (R26.81);Other abnormalities of gait and mobility (R26.89);Muscle weakness (generalized) (M62.81);Pain Pain -  Right/Left: Right Pain - part of body: Hip     Time: 1478-2956 PT Time Calculation (min) (ACUTE ONLY): 17 min  Charges:  $Gait Training: 8-22 mins                    G Codes:       Jean Baldwin, PT, DPT Acute Rehab Services Pager: [phone]     Jean Baldwin 07/14/2017, 10:20 AM

## 2017-07-14 NOTE — Progress Notes (Signed)
Subjective: 2 Days Post-Op Procedure(s) (LRB): RIGHT TOTAL HIP ARTHROPLASTY ANTERIOR APPROACH (Right) Patient reports pain as moderate.  Nausea improved today. Did well with PT this AM plans to do steps this afternoon.   Objective: Vital signs in last 24 hours: Temp:  [98.1 F (36.7 C)-98.5 F (36.9 C)] 98.2 F (36.8 C) (03/07 0500) Pulse Rate:  [89-101] 89 (03/07 0500) Resp:  [15-18] 15 (03/07 0500) BP: (135-159)/(53-69) 159/54 (03/07 0500) SpO2:  [97 %-99 %] 99 % (03/07 0500)  Intake/Output from previous day: 03/06 0701 - 03/07 0700 In: 240 [P.O.:240] Out: -  Intake/Output this shift: No intake/output data recorded.  Recent Labs    07/13/17 0810  HGB 10.8*   Recent Labs    07/13/17 0810  WBC 12.0*  RBC 3.91  HCT 33.9*  PLT 309   Recent Labs    07/13/17 0810  NA 141  K 4.5  CL 105  CO2 26  BUN 11  CREATININE 0.73  GLUCOSE 133*  CALCIUM 9.1   No results for input(s): LABPT, INR in the last 72 hours.  Right lower Extremity: Sensation intact distally Intact pulses distally Dorsiflexion/Plantar flexion intact Incision: scant drainage Compartment soft  Assessment/Plan: 2 Days Post-Op Procedure(s) (LRB): RIGHT TOTAL HIP ARTHROPLASTY ANTERIOR APPROACH (Right) Up with therapy  Plan discharge home tomorrow.   Jean Baldwin 07/14/2017, 11:08 AM

## 2017-07-15 NOTE — Progress Notes (Signed)
Patient ID: Jean Baldwin, female   DOB: 06/29/1950, 67 y.o.   MRN: [id number] Doing well overall.  Right hip stable.  Can be discharged to home today.

## 2017-07-15 NOTE — Care Management Important Message (Signed)
Important Message  Patient Details  Name: Jean Baldwin MRN: [id number] Date of Birth: 05/18/1950   Medicare Important Message Given:  Yes    Dorena BodoIris Oreoluwa Gilmer 07/15/2017, 12:52 PM

## 2017-07-15 NOTE — Discharge Summary (Signed)
Patient ID: Jean Baldwin MRN: [id number] DOB/AGE: [birth year] 68 y.o.  Admit date: 07/12/2017 Discharge date: 07/15/2017  Admission Diagnoses:  Principal Problem:   Unilateral primary osteoarthritis, right hip Active Problems:   Status post total replacement of right hip   Discharge Diagnoses:  Same  Past Medical History:  Diagnosis Date  . Arthritis    knees  . Dental crowns present   . High cholesterol   . Hypertension    states under control with meds., has been on med > 20 yr.  . Hypothyroidism   . TFCC (triangular fibrocartilage complex) tear 03/2017   right wrist/wrist    Surgeries: Procedure(s): RIGHT TOTAL HIP ARTHROPLASTY ANTERIOR APPROACH on 07/12/2017   Consultants:   Discharged Condition: Improved  Hospital Course: Jean Baldwin is an 67 y.o. female who was admitted 07/12/2017 for operative treatment ofUnilateral primary osteoarthritis, right hip. Patient has severe unremitting pain that affects sleep, daily activities, and work/hobbies. After pre-op clearance the patient was taken to the operating room on 07/12/2017 and underwent  Procedure(s): RIGHT TOTAL HIP ARTHROPLASTY ANTERIOR APPROACH.    Patient was given perioperative antibiotics:  Anti-infectives (From admission, onward)   Start     Dose/Rate Route Frequency Ordered Stop   07/12/17 2115  ceFAZolin (ANCEF) IVPB 1 g/50 mL premix     1 g 100 mL/hr over 30 Minutes Intravenous Every 6 hours 07/12/17 2112 07/13/17 0334   07/12/17 1400  ceFAZolin (ANCEF) IVPB 2g/100 mL premix     2 g 200 mL/hr over 30 Minutes Intravenous To [facility] 07/11/17 1128 07/12/17 1835       Patient was given sequential compression devices, early ambulation, and chemoprophylaxis to prevent DVT.  Patient benefited maximally from hospital stay and there were no complications.    Recent vital signs:  Patient Vitals for the past 24 hrs:  BP Temp Temp src Pulse Resp SpO2  07/15/17 0457 (!) 148/62 98 F (36.7 C) Oral 100 16  91 %  07/14/17 2101 (!) 162/63 98 F (36.7 C) Oral (!) 105 18 95 %  07/14/17 1505 (!) 149/64 98.8 F (37.1 C) Oral 94 - 94 %     Recent laboratory studies:  Recent Labs    07/13/17 0810  WBC 12.0*  HGB 10.8*  HCT 33.9*  PLT 309  NA 141  K 4.5  CL 105  CO2 26  BUN 11  CREATININE 0.73  GLUCOSE 133*  CALCIUM 9.1     Discharge Medications:   Allergies as of 07/15/2017      Reactions   Codeine Nausea And Vomiting   Sulfa Antibiotics Rash      Medication List    STOP taking these medications   aspirin 81 MG tablet Replaced by:  aspirin 81 MG chewable tablet   meloxicam 15 MG tablet Commonly known as:  MOBIC     TAKE these medications   amLODipine 5 MG tablet Commonly known as:  NORVASC Take 5 mg by mouth daily.   aspirin 81 MG chewable tablet Chew 1 tablet (81 mg total) by mouth 2 (two) times daily. Replaces:  aspirin 81 MG tablet   atorvastatin 20 MG tablet Commonly known as:  LIPITOR Take 20 mg by mouth every evening.   calcium carbonate 1500 (600 Ca) MG Tabs tablet Commonly known as:  OSCAL Take 2 tablets by mouth daily.   Fish Oil 1000 MG Caps Take 1 capsule by mouth daily.   KLS ALLERCLEAR 10 MG tablet Generic drug:  loratadine  Take 10 mg by mouth daily.   levothyroxine 100 MCG tablet Commonly known as:  SYNTHROID, LEVOTHROID Take 100 mcg by mouth daily before breakfast.   losartan 100 MG tablet Commonly known as:  COZAAR Take 100 mg by mouth daily.   methocarbamol 500 MG tablet Commonly known as:  ROBAXIN Take 1 tablet (500 mg total) by mouth every 6 (six) hours as needed for muscle spasms.   multivitamin tablet Take 1 tablet by mouth daily.   niacin 500 MG CR tablet Commonly known as:  NIASPAN Take 500 mg by mouth at bedtime.   ondansetron 4 MG tablet Commonly known as:  ZOFRAN Take 1 tablet (4 mg total) by mouth every 6 (six) hours as needed for nausea.   traMADol 50 MG tablet Commonly known as:  ULTRAM Take 2 tablets (100 mg  total) by mouth every 6 (six) hours as needed for moderate pain.   vitamin C 500 MG tablet Commonly known as:  ASCORBIC ACID Take 500 mg by mouth daily.            Durable Medical Equipment  (From admission, onward)        Start     Ordered   07/12/17 2113  DME 3 n 1  Once     07/12/17 2112   07/12/17 2113  DME Walker rolling  Once    Question:  Patient needs a walker to treat with the following condition  Answer:  Status post total replacement of right hip   07/12/17 2112      Diagnostic Studies: Dg C-arm 1-60 Min  Result Date: 07/12/2017 CLINICAL DATA:  Arthroplasty EXAM: DG C-ARM 61-120 MIN; OPERATIVE RIGHT HIP WITH PELVIS COMPARISON:  04/26/2017 FINDINGS: Three low resolution intraoperative spot views are submitted. Total fluoroscopy time was 48 seconds. The images demonstrate a right hip replacement with normal alignment. There is gas in the lateral soft tissues. IMPRESSION: Intraoperative fluoroscopic assistance provided during right hip replacement Electronically Signed   By: Jasmine Pang M.D.   On: 07/12/2017 20:12   US Breast Ltd Uni Left Inc Axilla  Result Date: 07/11/2017 CLINICAL DATA:  The patient returns after screening study for evaluation of possible LEFT breast mass. EXAM: DIGITAL DIAGNOSTIC LEFT MAMMOGRAM WITH CAD AND TOMO ULTRASOUND LEFT BREAST COMPARISON:  07/07/2017 and earlier ACR Breast Density Category c: The breast tissue is heterogeneously dense, which may obscure small masses. FINDINGS: Additional 2-D and 3-D images are performed. Views confirm presence of an oval circumscribed mass in the MEDIAL aspect of the LEFT breast further evaluated with ultrasound. Mammographic images were processed with CAD. On physical exam, I palpate no abnormality in the MEDIAL aspect of the LEFT breast. Targeted ultrasound is performed, showing a simple cyst in the 10 o'clock location of the LEFT breast 6 centimeters from the nipple which measures 1.0 x 0.4 x 0.7 centimeters.  There is associated mild duct ectasia. No suspicious finding in the MEDIAL quadrants of the LEFT breast. IMPRESSION: Simple cyst in the 10 o'clock location of the LEFT breast accounting for the mammographic abnormality. No mammographic or ultrasound evidence for malignancy. RECOMMENDATION: Screening mammogram in one year.(Code:SM-B-01Y) I have discussed the findings and recommendations with the patient. Results were also provided in writing at the conclusion of the visit. If applicable, a reminder letter will be sent to the patient regarding the next appointment. BI-RADS CATEGORY  2: Benign. Electronically Signed   By: Norva Pavlov M.D.   On: 07/11/2017 16:01   Mm Diag Breast  Tomo Uni Left  Result Date: 07/11/2017 CLINICAL DATA:  The patient returns after screening study for evaluation of possible LEFT breast mass. EXAM: DIGITAL DIAGNOSTIC LEFT MAMMOGRAM WITH CAD AND TOMO ULTRASOUND LEFT BREAST COMPARISON:  07/07/2017 and earlier ACR Breast Density Category c: The breast tissue is heterogeneously dense, which may obscure small masses. FINDINGS: Additional 2-D and 3-D images are performed. Views confirm presence of an oval circumscribed mass in the MEDIAL aspect of the LEFT breast further evaluated with ultrasound. Mammographic images were processed with CAD. On physical exam, I palpate no abnormality in the MEDIAL aspect of the LEFT breast. Targeted ultrasound is performed, showing a simple cyst in the 10 o'clock location of the LEFT breast 6 centimeters from the nipple which measures 1.0 x 0.4 x 0.7 centimeters. There is associated mild duct ectasia. No suspicious finding in the MEDIAL quadrants of the LEFT breast. IMPRESSION: Simple cyst in the 10 o'clock location of the LEFT breast accounting for the mammographic abnormality. No mammographic or ultrasound evidence for malignancy. RECOMMENDATION: Screening mammogram in one year.(Code:SM-B-01Y) I have discussed the findings and recommendations with the  patient. Results were also provided in writing at the conclusion of the visit. If applicable, a reminder letter will be sent to the patient regarding the next appointment. BI-RADS CATEGORY  2: Benign. Electronically Signed   By: Norva PavlovElizabeth  Brown M.D.   On: 07/11/2017 16:01   Mm Screening Breast Tomo Bilateral  Result Date: 07/07/2017 CLINICAL DATA:  Screening. EXAM: DIGITAL SCREENING BILATERAL MAMMOGRAM WITH TOMO AND CAD COMPARISON:  Previous exam(s). ACR Breast Density Category c: The breast tissue is heterogeneously dense, which may obscure small masses. FINDINGS: In the left breast, a possible mass warrants further evaluation. In the right breast, no findings suspicious for malignancy. Images were processed with CAD. IMPRESSION: Further evaluation is suggested for possible mass in the left breast. RECOMMENDATION: Diagnostic mammogram and possibly ultrasound of the left breast. (Code:[id number]-30M) The patient will be contacted regarding the findings, and additional imaging will be scheduled. BI-RADS CATEGORY  0: Incomplete. Need additional imaging evaluation and/or prior mammograms for comparison. Electronically Signed   By: Hulan Saashomas  Lawrence M.D.   On: 07/07/2017 16:41   Dg Hip Operative Unilat W Or W/o Pelvis Right  Result Date: 07/12/2017 CLINICAL DATA:  Arthroplasty EXAM: DG C-ARM 61-120 MIN; OPERATIVE RIGHT HIP WITH PELVIS COMPARISON:  04/26/2017 FINDINGS: Three low resolution intraoperative spot views are submitted. Total fluoroscopy time was 48 seconds. The images demonstrate a right hip replacement with normal alignment. There is gas in the lateral soft tissues. IMPRESSION: Intraoperative fluoroscopic assistance provided during right hip replacement Electronically Signed   By: Jasmine PangKim  Fujinaga M.D.   On: 07/12/2017 20:12    Disposition: 01-Home or Self Care    Follow-up Information    Kathryne HitchBlackman, Roselynn Whitacre Y, MD. Schedule an appointment as soon as possible for a visit in 2 week(s).   Specialty:   Orthopedic Surgery Contact information: [address] [place] [address] [phone]        [facility] up.   Specialty:  Home Health Services Why:  A representative from [facility] will contact you to arrange start date and time for your therapy.  Contact information: [address] [place] [address] [phone]            Signed: Kathryne HitchChristopher Y Raine Blodgett 07/15/2017, 6:47 AM

## 2017-07-15 NOTE — Progress Notes (Signed)
Physical Therapy Treatment and Discharge Patient Details Name: Jean Baldwin MRN: [id number] DOB: 04/28/1951 Today's Date: 07/15/2017    History of Present Illness Pt is 67 y.o. female s/p R THA direct anterior approach. PMHx: Arthritis, HTN, Hypothyroism.    PT Comments    Patient doing well this morning, progressed to supervision on stairs with husband present for education on guarding. Pt and family educated on safety considerations for home and have no further questions or concerns at this time. Pt has met all functional goals and will benefit from skilled home health PT when medically cleared for d/c.     Follow Up Recommendations  Home health PT;Supervision for mobility/OOB     Equipment Recommendations  None recommended by PT    Recommendations for Other Services       Precautions / Restrictions Precautions Precautions: Fall Restrictions Weight Bearing Restrictions: Yes RLE Weight Bearing: Weight bearing as tolerated    Mobility  Bed Mobility Overal bed mobility: Modified Independent Bed Mobility: Sit to Supine              Transfers Overall transfer level: Needs assistance Equipment used: Rolling walker (2 wheeled) Transfers: Sit to/from Stand Sit to Stand: Supervision Stand pivot transfers: Supervision       General transfer comment: Supervision for safety, no physical assist required. Good hand placement and technique  Ambulation/Gait Ambulation/Gait assistance: Supervision Ambulation Distance (Feet): 50 Feet Assistive device: Rolling walker (2 wheeled) Gait Pattern/deviations: Antalgic;Decreased stance time - right;Step-through pattern;Step-to pattern     General Gait Details: Patient with step through gait, supervision level at this time   Stairs Stairs: Yes   Stair Management: One rail Left;Forwards Number of Stairs: 12 General stair comments: family educated on proper guarding. pt progressed to supervision for stairs today, feels confident  she can do at home. reinforced sequencing with pt and husband.   Wheelchair Mobility    Modified Rankin (Stroke Patients Only)       Balance Overall balance assessment: Needs assistance Sitting-balance support: Feet supported;No upper extremity supported Sitting balance-Leahy Scale: Good     Standing balance support: No upper extremity supported;During functional activity Standing balance-Leahy Scale: Fair Standing balance comment: for static standing                            Cognition Arousal/Alertness: Awake/alert Behavior During Therapy: WFL for tasks assessed/performed Overall Cognitive Status: Within Functional Limits for tasks assessed                                        Exercises      General Comments        Pertinent Vitals/Pain Pain Assessment: 0-10 Pain Score: 7  Pain Location: R hip Pain Descriptors / Indicators: Sore Pain Intervention(s): Limited activity within patient's tolerance;Monitored during session;Premedicated before session;Repositioned    Home Living                      Prior Function            PT Goals (current goals can now be found in the care plan section) Acute Rehab PT Goals Patient Stated Goal: Home with family  PT Goal Formulation: With patient Time For Goal Achievement: 07/20/17 Potential to Achieve Goals: Good Progress towards PT goals: Goals met/education completed, patient discharged from PT    Frequency             PT Plan Current plan remains appropriate    Co-evaluation              AM-PAC PT "6 Clicks" Daily Activity  Outcome Measure  Difficulty turning over in bed (including adjusting bedclothes, sheets and blankets)?: A Little Difficulty moving from lying on back to sitting on the side of the bed? : A Little Difficulty sitting down on and standing up from a chair with arms (e.g., wheelchair, bedside commode, etc,.)?: A Little Help needed moving to and from a  bed to chair (including a wheelchair)?: A Little Help needed walking in hospital room?: A Little Help needed climbing 3-5 steps with a railing? : A Little 6 Click Score: 18    End of Session Equipment Utilized During Treatment: Gait belt Activity Tolerance: Patient tolerated treatment well Patient left: in bed;with call bell/phone within reach Nurse Communication: Mobility status PT Visit Diagnosis: Unsteadiness on feet (R26.81);Other abnormalities of gait and mobility (R26.89);Muscle weakness (generalized) (M62.81);Pain Pain - Right/Left: Right Pain - part of body: Hip     Time: 1044-1100 PT Time Calculation (min) (ACUTE ONLY): 16 min  Charges:  $Gait Training: 8-22 mins                    G Codes:       Sean Donnelly, PT, DPT Acute Rehab Services Pager: [phone]     Sean  Donnelly 07/15/2017, 11:09 AM   

## 2017-07-18 ENCOUNTER — Telehealth (INDEPENDENT_AMBULATORY_CARE_PROVIDER_SITE_OTHER): Payer: Self-pay | Admitting: Orthopaedic Surgery

## 2017-07-18 NOTE — Telephone Encounter (Signed)
Verbal order left on VM  

## 2017-07-18 NOTE — Telephone Encounter (Signed)
Bruce, PT with [facility] is requesting home health pt visits 3 x for 2 weeks.  CB # [phone]

## 2017-07-26 ENCOUNTER — Ambulatory Visit (INDEPENDENT_AMBULATORY_CARE_PROVIDER_SITE_OTHER): Payer: Medicare Other | Admitting: Physician Assistant

## 2017-07-26 ENCOUNTER — Encounter (INDEPENDENT_AMBULATORY_CARE_PROVIDER_SITE_OTHER): Payer: Self-pay | Admitting: Physician Assistant

## 2017-07-26 DIAGNOSIS — Z96641 Presence of right artificial hip joint: Secondary | ICD-10-CM

## 2017-07-26 MED ORDER — GABAPENTIN 300 MG PO CAPS: ORAL_CAPSULE | ORAL | 0 refills | Status: DC

## 2017-07-26 MED ORDER — AMOXICILLIN 500 MG PO TABS: ORAL_TABLET | ORAL | 0 refills | Status: DC

## 2017-07-26 NOTE — Progress Notes (Signed)
HPI: [name] returns today 2 weeks status post right total hip arthroplasty.  She is overall doing well.  She is using a cane some but mostly walking without a cane.  She is on aspirin 81 mg twice daily.  Is taking his tramadol for pain.  She denies any fevers chills shortness of breath chest pain.  Physical exam: Right calf supple nontender.  Dorsal pedal pulses are intact bilaterally.  Right surgical incisions healing well no signs of infection.  Positive for seroma right hip.  Impression: 2 weeks status post right total hip arthroplasty  Plan: She will go back to her 81 mg aspirin that she is on prior to surgery.  Continue to work on overall range of motion strengthening.  Staples removed Steri-Strips applied.  She is able to get the incision wet in the shower.  200 cc of seroma aspirate was obtained today patient tolerated aspiration well.  See her back in a month or sooner if there is any questions or concerns.

## 2017-08-01 ENCOUNTER — Telehealth (INDEPENDENT_AMBULATORY_CARE_PROVIDER_SITE_OTHER): Payer: Self-pay | Admitting: Orthopaedic Surgery

## 2017-08-01 NOTE — Telephone Encounter (Signed)
Patient aware she is more than welcome to just take her OTC Advil instead

## 2017-08-01 NOTE — Telephone Encounter (Signed)
Surgery Date  07/12/17 Total Hip replacement  Unilateral primary osteoarthritis, right hip    Pt called and she feels like she is ready to take a med that's over the counter instead of tradamol

## 2017-08-31 ENCOUNTER — Encounter (INDEPENDENT_AMBULATORY_CARE_PROVIDER_SITE_OTHER): Payer: Self-pay | Admitting: Orthopaedic Surgery

## 2017-08-31 ENCOUNTER — Ambulatory Visit (INDEPENDENT_AMBULATORY_CARE_PROVIDER_SITE_OTHER): Payer: Medicare Other | Admitting: Physician Assistant

## 2017-08-31 ENCOUNTER — Ambulatory Visit (INDEPENDENT_AMBULATORY_CARE_PROVIDER_SITE_OTHER): Payer: Medicare Other | Admitting: Orthopaedic Surgery

## 2017-08-31 DIAGNOSIS — Z96641 Presence of right artificial hip joint: Secondary | ICD-10-CM

## 2017-08-31 NOTE — Progress Notes (Signed)
HPI: [name] returns today 50 days status post right total hip arthroplasty.  She states she overall is doing very well.  She has some tingling about the incision.  And some stiffness whenever she first gets up.  She is been established physical therapy.  She is taking some Advil as needed.  Otherwise very happy with the results.  Physical exam: Right hip good range of motion without pain.  Calf supple nontender.  She ambulates without any assistive device nonantalgic gait.  Impression: Status post right total hip arthroplasty 50 days.  Plan: We will see her back in 1 year postop at that time obtain an AP pelvis and lateral view of her right hip.  She will follow-up sooner if there is any questions or concerns.  Encouraged her to continue to work on scar tissue mobilization.  Questions were encouraged and answered by Jean Baldwin and myself.

## 2018-02-22 DIAGNOSIS — H9012 Conductive hearing loss, unilateral, left ear, with unrestricted hearing on the contralateral side: Secondary | ICD-10-CM | POA: Insufficient documentation

## 2018-02-22 DIAGNOSIS — H6502 Acute serous otitis media, left ear: Secondary | ICD-10-CM | POA: Insufficient documentation

## 2018-04-11 DIAGNOSIS — H6993 Unspecified Eustachian tube disorder, bilateral: Secondary | ICD-10-CM | POA: Insufficient documentation

## 2018-04-11 DIAGNOSIS — H93A2 Pulsatile tinnitus, left ear: Secondary | ICD-10-CM | POA: Insufficient documentation

## 2018-05-20 ENCOUNTER — Emergency Department (HOSPITAL_COMMUNITY)
Admission: EM | Admit: 2018-05-20 | Discharge: 2018-05-20 | Disposition: A | Discharge status: Home / Self Care | Payer: Medicare Other | Attending: Emergency Medicine | Admitting: Emergency Medicine

## 2018-05-20 ENCOUNTER — Encounter (HOSPITAL_COMMUNITY): Payer: Self-pay | Admitting: Emergency Medicine

## 2018-05-20 ENCOUNTER — Other Ambulatory Visit: Payer: Self-pay

## 2018-05-20 ENCOUNTER — Emergency Department (HOSPITAL_COMMUNITY): Payer: Medicare Other

## 2018-05-20 DIAGNOSIS — E039 Hypothyroidism, unspecified: Secondary | ICD-10-CM | POA: Insufficient documentation

## 2018-05-20 DIAGNOSIS — I10 Essential (primary) hypertension: Secondary | ICD-10-CM | POA: Diagnosis not present

## 2018-05-20 DIAGNOSIS — Z7982 Long term (current) use of aspirin: Secondary | ICD-10-CM | POA: Diagnosis not present

## 2018-05-20 DIAGNOSIS — Z79899 Other long term (current) drug therapy: Secondary | ICD-10-CM | POA: Diagnosis not present

## 2018-05-20 DIAGNOSIS — N200 Calculus of kidney: Secondary | ICD-10-CM | POA: Insufficient documentation

## 2018-05-20 DIAGNOSIS — R109 Unspecified abdominal pain: Secondary | ICD-10-CM | POA: Diagnosis present

## 2018-05-20 LAB — CBC WITH DIFFERENTIAL/PLATELET
Abs Immature Granulocytes: 0.03 10*3/uL (ref 0.00–0.07)
BASOS ABS: 0.1 10*3/uL (ref 0.0–0.1)
BASOS PCT: 1 %
Eosinophils Absolute: 0.4 10*3/uL (ref 0.0–0.5)
Eosinophils Relative: 4 %
HCT: 40 % (ref 36.0–46.0)
Hemoglobin: 12.1 g/dL (ref 12.0–15.0)
IMMATURE GRANULOCYTES: 0 %
Lymphocytes Relative: 11 %
Lymphs Abs: 1.1 10*3/uL (ref 0.7–4.0)
MCH: 26.7 pg (ref 26.0–34.0)
MCHC: 30.3 g/dL (ref 30.0–36.0)
MCV: 88.3 fL (ref 80.0–100.0)
Monocytes Absolute: 0.5 10*3/uL (ref 0.1–1.0)
Monocytes Relative: 5 %
NRBC: 0 % (ref 0.0–0.2)
Neutro Abs: 7.6 10*3/uL (ref 1.7–7.7)
Neutrophils Relative %: 79 %
PLATELETS: 293 10*3/uL (ref 150–400)
RBC: 4.53 MIL/uL (ref 3.87–5.11)
RDW: 13.8 % (ref 11.5–15.5)
WBC: 9.7 10*3/uL (ref 4.0–10.5)

## 2018-05-20 LAB — URINALYSIS, ROUTINE W REFLEX MICROSCOPIC
BILIRUBIN URINE: NEGATIVE
Glucose, UA: NEGATIVE mg/dL
Ketones, ur: 5 mg/dL — AB
NITRITE: NEGATIVE
Protein, ur: 30 mg/dL — AB
RBC / HPF: >50 RBC/hpf — ABNORMAL HIGH (ref 0–5)
Specific Gravity, Urine: 1.024 (ref 1.005–1.030)
pH: 5 (ref 5.0–8.0)

## 2018-05-20 LAB — COMPREHENSIVE METABOLIC PANEL
ALBUMIN: 3.9 g/dL (ref 3.5–5.0)
ALT: 20 U/L (ref 0–44)
AST: 22 U/L (ref 15–41)
Alkaline Phosphatase: 94 U/L (ref 38–126)
Anion gap: 10 (ref 5–15)
BUN: 21 mg/dL (ref 8–23)
CHLORIDE: 106 mmol/L (ref 98–111)
CO2: 24 mmol/L (ref 22–32)
CREATININE: 0.87 mg/dL (ref 0.44–1.00)
Calcium: 9.4 mg/dL (ref 8.9–10.3)
GFR calc non Af Amer: >60 mL/min (ref >60)
GLUCOSE: 110 mg/dL — AB (ref 70–99)
Potassium: 3.7 mmol/L (ref 3.5–5.1)
Sodium: 140 mmol/L (ref 135–145)
Total Bilirubin: 0.5 mg/dL (ref 0.3–1.2)
Total Protein: 6.5 g/dL (ref 6.5–8.1)

## 2018-05-20 LAB — LIPASE, BLOOD: LIPASE: 26 U/L (ref 11–51)

## 2018-05-20 MED ORDER — HYDROMORPHONE HCL 1 MG/ML IJ SOLN
0.5000 mg | Freq: Once | INTRAMUSCULAR | Status: AC
  Administered 2018-05-20: 0.5 mg via INTRAVENOUS
  Filled 2018-05-20: qty 1

## 2018-05-20 MED ORDER — ONDANSETRON HCL 4 MG/2ML IJ SOLN
4.0000 mg | Freq: Once | INTRAMUSCULAR | Status: AC
  Administered 2018-05-20: 4 mg via INTRAVENOUS
  Filled 2018-05-20: qty 2

## 2018-05-20 MED ORDER — KETOROLAC TROMETHAMINE 30 MG/ML IJ SOLN
30.0000 mg | Freq: Once | INTRAMUSCULAR | Status: AC
  Administered 2018-05-20: 30 mg via INTRAVENOUS
  Filled 2018-05-20: qty 1

## 2018-05-20 MED ORDER — LACTATED RINGERS IV BOLUS
1000.0000 mL | Freq: Once | INTRAVENOUS | Status: AC
Start: 2018-05-20 — End: 2018-05-20
  Administered 2018-05-20: 1000 mL via INTRAVENOUS

## 2018-05-20 MED ORDER — ONDANSETRON HCL 4 MG PO TABS: 4.0000 mg | ORAL_TABLET | Freq: Three times a day (TID) | ORAL | 0 refills | Status: DC | PRN

## 2018-05-20 NOTE — ED Provider Notes (Signed)
[facility] EMERGENCY DEPARTMENT Provider Note   CSN: [id number] Arrival date & time: 05/20/18  6063   History   Chief Complaint Chief Complaint  Patient presents with  . Flank Pain  . Emesis    HPI Jean Baldwin is a 68 y.o. female.  HPI  Patient is a 68 year old female with a past medical history of HTN, HDL, hypothyroidism, and kidney stones on the right and the left who presents for evaluation of colicky left flank and back pain that began around 2 PM today.  Patient states that prior to 2 PM today she felt well and has not had any recent fevers, headache, earache, chest pain, shortness of breath, diarrhea, dysuria, vaginal bleeding or discharge, rash, pain numbness or tingling arms or legs.  She states that since 2:00 she has had some intermittent crampy pain associated with one episode of nonbloody nonbilious emesis and bloody urine.  She states she is not had anything to eat or drink since breakfast today.  She states she is not currently nauseous but her pain is currently 4 out of 10 in intensity.  Past Medical History:  Diagnosis Date  . Arthritis    knees  . Dental crowns present   . High cholesterol   . Hypertension    states under control with meds., has been on med > 20 yr.  . Hypothyroidism   . TFCC (triangular fibrocartilage complex) tear 03/2017   right wrist/wrist    Patient Active Problem List   Diagnosis Date Noted  . Status post total replacement of right hip 07/12/2017  . Pain of right hip joint 05/25/2017  . Unilateral primary osteoarthritis, right hip 05/25/2017    Past Surgical History:  Procedure Laterality Date  . KNEE ARTHROSCOPY W/ MENISCAL REPAIR Left   . NASAL SEPTUM SURGERY    . TOTAL HIP ARTHROPLASTY Right 07/12/2017   Procedure: RIGHT TOTAL HIP ARTHROPLASTY ANTERIOR APPROACH;  Surgeon: Kathryne Hitch, MD;  Location: [facility] OR;  Service: Orthopedics;  Laterality: Right;  . TUBAL LIGATION    . WRIST ARTHROSCOPY WITH  DEBRIDEMENT Right 04/12/2017   Procedure: RIGHT WRIST ARTHROSCOPY WITH DEBRIDEMENT;  Surgeon: Cindee Salt, MD;  Location: [facility];  Service: Orthopedics;  Laterality: Right;     OB History   No obstetric history on file.      Home Medications    Prior to Admission medications   Medication Sig Start Date End Date Taking? Authorizing Provider  amLODipine (NORVASC) 5 MG tablet Take 5 mg by mouth daily.    [provider]  amoxicillin (AMOXIL) 500 MG tablet Take two tabs by mouth one hour before dental appointment and two tabs six hours after dental appt 07/26/17   Kirtland Bouchard, PA-C  aspirin 81 MG chewable tablet Chew 1 tablet (81 mg total) by mouth 2 (two) times daily. 07/14/17   Kirtland Bouchard, PA-C  atorvastatin (LIPITOR) 20 MG tablet Take 20 mg by mouth every evening.     [provider]  calcium carbonate (OSCAL) 1500 (600 Ca) MG TABS tablet Take 2 tablets by mouth daily.     [provider]  gabapentin (NEURONTIN) 300 MG capsule Take at night as needed 07/26/17   Kirtland Bouchard, PA-C  levothyroxine (SYNTHROID, LEVOTHROID) 100 MCG tablet Take 100 mcg by mouth daily before breakfast.     [provider]  loratadine (KLS ALLERCLEAR) 10 MG tablet Take 10 mg by mouth daily.    [provider]  losartan (COZAAR) 100 MG tablet Take 100 mg by mouth daily.  03/02/13   [provider]  methocarbamol (ROBAXIN) 500 MG tablet Take 1 tablet (500 mg total) by mouth every 6 (six) hours as needed for muscle spasms. 07/14/17   Kirtland Bouchard, PA-C  Multiple Vitamin (MULTIVITAMIN) tablet Take 1 tablet by mouth daily.    [provider]  niacin (NIASPAN) 500 MG CR tablet Take 500 mg by mouth at bedtime.  03/02/13   [provider]  Omega-3 Fatty Acids (FISH OIL) 1000 MG CAPS Take 1 capsule by mouth daily.    [provider]  ondansetron (ZOFRAN) 4 MG tablet Take 1 tablet (4 mg total) by mouth every 8  (eight) hours as needed for up to 10 doses for nausea or vomiting. 05/20/18   Antoine Primas, MD  traMADol (ULTRAM) 50 MG tablet Take 2 tablets (100 mg total) by mouth every 6 (six) hours as needed for moderate pain. 07/14/17   Kirtland Bouchard, PA-C  vitamin C (ASCORBIC ACID) 500 MG tablet Take 500 mg by mouth daily.    [provider]    Family History History reviewed. No pertinent family history.  Social History Social History   Tobacco Use  . Smoking status: Never Smoker  . Smokeless tobacco: Never Used  Substance Use Topics  . Alcohol use: No  . Drug use: No     Allergies   Codeine and Sulfa antibiotics   Review of Systems Review of Systems  Constitutional: Negative for chills and fever.  HENT: Negative for ear pain and sore throat.   Eyes: Negative for pain and visual disturbance.  Respiratory: Negative for cough and shortness of breath.   Cardiovascular: Negative for chest pain and palpitations.  Gastrointestinal: Positive for nausea and vomiting. Negative for abdominal pain.  Genitourinary: Positive for flank pain ( L ) and hematuria. Negative for dysuria.  Musculoskeletal: Positive for back pain ( L ). Negative for arthralgias.  Skin: Negative for color change and rash.  Neurological: Negative for seizures and syncope.  All other systems reviewed and are negative.    Physical Exam Updated Vital Signs BP 140/69   Pulse 80   Temp 98.1 F (36.7 C)   Resp 19   Ht 5\' 3"  (1.6 m)   Wt 79.8 kg   SpO2 100%   BMI 31.18 kg/m   Physical Exam Vitals signs and nursing note reviewed.  Constitutional:      General: She is not in acute distress.    Appearance: She is well-developed.  HENT:     Head: Normocephalic and atraumatic.  Eyes:     Conjunctiva/sclera: Conjunctivae normal.  Neck:     Musculoskeletal: Neck supple.  Cardiovascular:     Rate and Rhythm: Normal rate and regular rhythm.     Heart sounds: No murmur.  Pulmonary:     Effort: Pulmonary  effort is normal. No respiratory distress.     Breath sounds: Normal breath sounds.  Abdominal:     Palpations: Abdomen is soft.     Tenderness: There is no abdominal tenderness. There is no right CVA tenderness or left CVA tenderness.  Musculoskeletal:     Right lower leg: No edema.     Left lower leg: No edema.  Skin:    General: Skin is warm and dry.  Neurological:     Mental Status: She is alert.      ED Treatments / Results  Labs (all labs ordered  are listed, but only abnormal results are displayed) Labs Reviewed  COMPREHENSIVE METABOLIC PANEL - Abnormal; Notable for the following components:      Result Value   Glucose, Bld 110 (*)    All other components within normal limits  URINALYSIS, ROUTINE W REFLEX MICROSCOPIC - Abnormal; Notable for the following components:   APPearance HAZY (*)    Hgb urine dipstick LARGE (*)    Ketones, ur 5 (*)    Protein, ur 30 (*)    Leukocytes, UA MODERATE (*)    RBC / HPF >50 (*)    Bacteria, UA RARE (*)    All other components within normal limits  URINE CULTURE  CBC WITH DIFFERENTIAL/PLATELET  LIPASE, BLOOD    EKG None  Radiology Ct Renal Stone Study  Result Date: 05/20/2018 CLINICAL DATA:  Left flank pain EXAM: CT ABDOMEN AND PELVIS WITHOUT CONTRAST TECHNIQUE: Multidetector CT imaging of the abdomen and pelvis was performed following the standard protocol without IV contrast. COMPARISON:  None. FINDINGS: Lower chest: 3 mm subpleural right middle lobe pulmonary nodule, series 4 image number 2. No acute consolidation or effusion. Normal heart size. Small hiatal hernia. Hepatobiliary: 2.3 cm cyst within the anterior liver. No calcified gallstones. No biliary dilatation Pancreas: Unremarkable. No pancreatic ductal dilatation or surrounding inflammatory changes. Spleen: Normal in size without focal abnormality. Adrenals/Urinary Tract: Adrenal glands are normal. Mild left hydronephrosis, secondary to a 3 mm left UPJ stone. Stomach/Bowel:  Stomach is within normal limits. No evidence of bowel wall thickening, distention, or inflammatory changes. Vascular/Lymphatic: Moderate aortic atherosclerosis. No significantly enlarged lymph nodes. Reproductive: Status post hysterectomy. No adnexal masses. Other: Negative for free air or free fluid Musculoskeletal: Prior right hip replacement. Grade 1 anterolisthesis L4 on L5. No acute or suspicious abnormality. IMPRESSION: 1. Mild left hydronephrosis, secondary to a 3 mm left UPJ stone. 2. 3 mm right middle lobe pulmonary nodule. No follow-up needed if patient is low-risk. Non-contrast chest CT can be considered in 12 months if patient is high-risk. This recommendation follows the consensus statement: Guidelines for Management of Incidental Pulmonary Nodules Detected on CT Images: From the Fleischner Society 2017; Radiology 2017; 284:228-243. Electronically Signed   By: Jasmine PangKim  Fujinaga M.D.   On: 05/20/2018 19:49    Procedures Procedures (including critical care time)  Medications Ordered in ED Medications  lactated ringers bolus 1,000 mL (1,000 mLs Intravenous New Bag/Given 05/20/18 1903)  HYDROmorphone (DILAUDID) injection 0.5 mg (0.5 mg Intravenous Given 05/20/18 1904)  ondansetron (ZOFRAN) injection 4 mg (4 mg Intravenous Given 05/20/18 1903)  ketorolac (TORADOL) 30 MG/ML injection 30 mg (30 mg Intravenous Given 05/20/18 2028)     Initial Impression / Assessment and Plan / ED Course  I have reviewed the triage vital signs and the nursing notes.  Pertinent labs & imaging results that were available during my care of the patient were reviewed by me and considered in my medical decision making (see chart for details).     Patient is a 68 year old female who presents with above-stated history exam.  On presentation patient is afebrile stable vital signs.  Exam as above.  Low suspicion for hepatobiliary etiology given patient has no tenderness palpation in the right upper quadrant and CMP shows a  unremarkable LFTs with no significant electrolyte or metabolic abnormalities.  I have low suspicion for acute pancreatitis given patient denies any daily EtOH  and lipase is 26.  UA shows large blood and in the absence of dysuria have a low suspicion for  infection at this time.  However urine culture was sent.  CT stone study 1. Mild left hydronephrosis, secondary to a 3 mm left UPJ stone. 2. 3 mm right middle lobe pulmonary nodule. No follow-up needed if patient is low-risk. Non-contrast chest CT can be considered in 12 months if patient is high-risk. This recommendation follows the consensus statement: Guidelines for Management of Incidental Pulmonary Nodules Detected on CT Images: From the Fleischner Society 2017; Radiology 2017; 284:228-243.  Patient given IV fluids, antiemetic, IV analgesia in the emergency department.  History exam is not consistent with ACS, PE, perforated viscus, appendicitis, diverticulitis, TOA, torsion, or other acute life-threatening intra-abdominal pathology.  Patient discharged stable condition.  Strict return precautions advised and discussed.  Instructed to follow-up with PCP in 3 to 5 days. Final Clinical Impressions(s) / ED Diagnoses   Final diagnoses:  Kidney stone    ED Discharge Orders         Ordered    ondansetron (ZOFRAN) 4 MG tablet  Every 8 hours PRN     05/20/18 2017           Antoine PrimasSmith, Kai Calico, MD 05/20/18 2043    Gerhard MunchLockwood, Robert, MD 05/20/18 2357

## 2018-05-20 NOTE — ED Triage Notes (Signed)
Pt reports around 2 pm today she started having L flank pain. Pt reports she had nausea, vomiting, and hematuria starting today. Pt reports hx of kidney stones, reports her symptoms were similar.

## 2018-05-22 LAB — URINE CULTURE: Culture: 20000 — AB

## 2018-05-23 ENCOUNTER — Telehealth: Payer: Self-pay | Admitting: *Deleted

## 2018-05-23 NOTE — Telephone Encounter (Signed)
Post ED Visit - Positive Culture Follow-up  Culture report reviewed by antimicrobial stewardship pharmacist:  [] Nathan Batchelder, Pharm.D. [] Jeremy Frens, Pharm.D., BCPS AQ-ID [] Mike Maccia, Pharm.D., BCPS [] Elizabeth Martin, Pharm.D., BCPS [] Minh Pham, Pharm.D., BCPS, AAHIVP [] Michelle Turner, Pharm.D., BCPS, AAHIVP [] Rachel Rumbarger, PharmD, BCPS [] Thuy Dang, PharmD, BCPS [] Alison Masters, PharmD, BCPS [] Erin Deja, PharmD  Positive urine culture Reviewed by Jeff Hedges, PA and no further patient follow-up is required at this time.  Jean Baldwin 05/23/2018, 9:52 AM   

## 2018-06-12 ENCOUNTER — Other Ambulatory Visit: Payer: Self-pay | Admitting: Family Medicine

## 2018-06-12 DIAGNOSIS — Z1231 Encounter for screening mammogram for malignant neoplasm of breast: Secondary | ICD-10-CM

## 2018-07-11 DIAGNOSIS — H9190 Unspecified hearing loss, unspecified ear: Secondary | ICD-10-CM | POA: Insufficient documentation

## 2018-07-11 DIAGNOSIS — J310 Chronic rhinitis: Secondary | ICD-10-CM | POA: Insufficient documentation

## 2018-07-13 ENCOUNTER — Ambulatory Visit
Admission: RE | Admit: 2018-07-13 | Discharge: 2018-07-13 | Disposition: A | Discharge status: Home / Self Care | Payer: Medicare Other | Source: Ambulatory Visit | Attending: Family Medicine | Admitting: Family Medicine

## 2018-07-13 DIAGNOSIS — Z1231 Encounter for screening mammogram for malignant neoplasm of breast: Secondary | ICD-10-CM

## 2018-08-31 ENCOUNTER — Ambulatory Visit (INDEPENDENT_AMBULATORY_CARE_PROVIDER_SITE_OTHER): Payer: Medicare Other

## 2018-08-31 ENCOUNTER — Other Ambulatory Visit: Payer: Self-pay

## 2018-08-31 ENCOUNTER — Ambulatory Visit (INDEPENDENT_AMBULATORY_CARE_PROVIDER_SITE_OTHER): Payer: Medicare Other | Admitting: Orthopaedic Surgery

## 2018-08-31 ENCOUNTER — Encounter (INDEPENDENT_AMBULATORY_CARE_PROVIDER_SITE_OTHER): Payer: Self-pay | Admitting: Orthopaedic Surgery

## 2018-08-31 DIAGNOSIS — M25551 Pain in right hip: Secondary | ICD-10-CM

## 2018-08-31 DIAGNOSIS — Z96641 Presence of right artificial hip joint: Secondary | ICD-10-CM | POA: Diagnosis not present

## 2018-08-31 NOTE — Progress Notes (Signed)
Office Visit Note   Patient: Jean Baldwin           Date of Birth: 06-23-1950           MRN: [id number] Visit Date: 08/31/2018              Requested by: Shirlean Mylar, MD [address] [place], [place] [address] PCP: Shirlean Mylar, MD   Assessment & Plan: Visit Diagnoses:  1. Pain in right hip   2. History of right hip replacement     Plan: She is doing very well overall and is very satisfied and pleased.  She is back to her regular activities and has no issues.  All questions and concerns were answered and addressed.  Follow-up at this point can be as needed.  Follow-Up Instructions: Return if symptoms worsen or fail to improve.   Orders:  Orders Placed This Encounter  Procedures  . XR HIP UNILAT W OR W/O PELVIS 1V RIGHT   No orders of the defined types were placed in this encounter.     Procedures: No procedures performed   Clinical Data: No additional findings.   Subjective: Chief Complaint  Patient presents with  . Right Hip - Follow-up  Patient is a very pleasant 68 year old female who is just over a year status post a right total hip arthroplasty.  She is doing well overall and has good range of motion and strength she states.  She still has some numbness and tingling around her thigh but overall feels like she is doing well.  She denies any issues with her left hip.  She is never had any other joint replacement before.  HPI  Review of Systems She currently denies any headache, chest pain, shortness of breath, fever, chills, nausea, vomiting.  Objective: Vital Signs: There were no vitals taken for this visit.  Physical Exam She is alert and orient x3 and in no acute distress Ortho Exam Examination of her right hip shows fluid range of motion with no difficulty or problems at all.  Examination of her left hip is normal as well.  She has some slight subjective numbness around her incision. Specialty Comments:  No specialty comments  available.  Imaging: Xr Hip Unilat W Or W/o Pelvis 1v Right  Result Date: 08/31/2018 An AP pelvis and lateral right hip shows a well-seated total hip arthroplasty on the right side with no complicating features.  The left hip does not show any significant signs of arthritic changes.    PMFS History: Patient Active Problem List   Diagnosis Date Noted  . History of right hip replacement 08/31/2018  . Status post total replacement of right hip 07/12/2017  . Pain of right hip joint 05/25/2017  . Unilateral primary osteoarthritis, right hip 05/25/2017   Past Medical History:  Diagnosis Date  . Arthritis    knees  . Dental crowns present   . High cholesterol   . Hypertension    states under control with meds., has been on med > 20 yr.  . Hypothyroidism   . TFCC (triangular fibrocartilage complex) tear 03/2017   right wrist/wrist    History reviewed. No pertinent family history.  Past Surgical History:  Procedure Laterality Date  . KNEE ARTHROSCOPY W/ MENISCAL REPAIR Left   . NASAL SEPTUM SURGERY    . TOTAL HIP ARTHROPLASTY Right 07/12/2017   Procedure: RIGHT TOTAL HIP ARTHROPLASTY ANTERIOR APPROACH;  Surgeon: Kathryne Hitch, MD;  Location: [facility] OR;  Service: Orthopedics;  Laterality: Right;  . TUBAL LIGATION    . WRIST ARTHROSCOPY WITH DEBRIDEMENT Right 04/12/2017   Procedure: RIGHT WRIST ARTHROSCOPY WITH DEBRIDEMENT;  Surgeon: Cindee SaltKuzma, Gary, MD;  Location: [facility];  Service: Orthopedics;  Laterality: Right;   Social History   Occupational History  . Not on file  Tobacco Use  . Smoking status: Never Smoker  . Smokeless tobacco: Never Used  Substance and Sexual Activity  . Alcohol use: No  . Drug use: No  . Sexual activity: Not on file

## 2019-06-08 ENCOUNTER — Other Ambulatory Visit: Payer: Self-pay | Admitting: Family Medicine

## 2019-06-08 DIAGNOSIS — Z1231 Encounter for screening mammogram for malignant neoplasm of breast: Secondary | ICD-10-CM

## 2019-06-08 DIAGNOSIS — E2839 Other primary ovarian failure: Secondary | ICD-10-CM

## 2019-06-13 ENCOUNTER — Other Ambulatory Visit: Payer: Self-pay

## 2019-06-13 ENCOUNTER — Ambulatory Visit
Admission: RE | Admit: 2019-06-13 | Discharge: 2019-06-13 | Disposition: A | Discharge status: Home / Self Care | Payer: Medicare PPO | Source: Ambulatory Visit | Attending: Family Medicine | Admitting: Family Medicine

## 2019-06-13 DIAGNOSIS — E2839 Other primary ovarian failure: Secondary | ICD-10-CM

## 2019-07-18 ENCOUNTER — Other Ambulatory Visit: Payer: Self-pay

## 2019-07-18 ENCOUNTER — Ambulatory Visit
Admission: RE | Admit: 2019-07-18 | Discharge: 2019-07-18 | Disposition: A | Discharge status: Home / Self Care | Payer: Medicare PPO | Source: Ambulatory Visit | Attending: Family Medicine | Admitting: Family Medicine

## 2019-07-18 DIAGNOSIS — Z1231 Encounter for screening mammogram for malignant neoplasm of breast: Secondary | ICD-10-CM

## 2019-09-19 ENCOUNTER — Other Ambulatory Visit: Payer: Self-pay

## 2019-09-19 ENCOUNTER — Ambulatory Visit: Payer: Medicare PPO | Admitting: Physician Assistant

## 2019-09-19 ENCOUNTER — Ambulatory Visit (INDEPENDENT_AMBULATORY_CARE_PROVIDER_SITE_OTHER): Payer: Medicare PPO

## 2019-09-19 ENCOUNTER — Encounter: Payer: Self-pay | Admitting: Orthopaedic Surgery

## 2019-09-19 DIAGNOSIS — M25562 Pain in left knee: Secondary | ICD-10-CM | POA: Diagnosis not present

## 2019-09-19 DIAGNOSIS — M1712 Unilateral primary osteoarthritis, left knee: Secondary | ICD-10-CM

## 2019-09-19 MED ORDER — METHYLPREDNISOLONE ACETATE 40 MG/ML IJ SUSP
40.0000 mg | INTRAMUSCULAR | Status: AC | PRN
  Administered 2019-09-19: 12:00:00 40 mg via INTRA_ARTICULAR

## 2019-09-19 MED ORDER — LIDOCAINE HCL 1 % IJ SOLN
3.0000 mL | INTRAMUSCULAR | Status: AC | PRN
  Administered 2019-09-19: 3 mL

## 2019-09-19 NOTE — Progress Notes (Signed)
Office Visit Note   Patient: Jean Baldwin           Date of Birth: [birth year]           MRN: [id number] Visit Date: 09/19/2019              Requested by: Maurice Small, MD [address] [place],  [place] [address] PCP: Maurice Small, MD   Assessment & Plan: Visit Diagnoses:  1. Primary osteoarthritis of left knee     Plan:  Discussed with Jean Baldwin quad strengthening and knee friendly exercises.  She understands that she needs to wait at least 3 months between cortisone injections in the knee.  She may benefit from supplemental injections in the future handout on supplemental injection was given.  Questions encouraged and answered at length.  Follow-Up Instructions: Return if symptoms worsen or fail to improve.   Orders:  Orders Placed This Encounter  Procedures  . Large Joint Inj  . XR Knee 1-2 Views Left   No orders of the defined types were placed in this encounter.     Procedures: Large Joint Inj on 09/19/2019 11:56 AM Indications: pain Details: 22 G 1.5 in needle, anterolateral approach  Arthrogram: No  Medications: 3 mL lidocaine 1 %; 40 mg methylPREDNISolone acetate 40 MG/ML Outcome: tolerated well, no immediate complications Procedure, treatment alternatives, risks and benefits explained, specific risks discussed. Consent was given by the patient. Immediately prior to procedure a time out was called to verify the correct patient, procedure, equipment, support staff and site/side marked as required. Patient was prepped and draped in the usual sterile fashion.       Clinical Data: No additional findings.   Subjective: Chief Complaint  Patient presents with  . Left Knee - Pain    HPI Jean Baldwin 69 year old female comes in today with left knee pain has been ongoing for some time.  No known injury.  She does report that she is doing a lot of yard work.  She tried a knee brace is on chronic Mobic .  No mechanical symptoms outside of the  knee feeling as if it was weak once or twice she did fall going up some steps.  She has difficulty going up and down stairs.  Has significant popping in both knees. Currently her left knee is more painful burning-like sensation in the anterior aspect of the knee.  She mainly wants to know what is going on with the knee.  Review of Systems See HPI otherwise negative  Objective: Vital Signs: There were no vitals taken for this visit.  Physical Exam Constitutional:      Appearance: She is not ill-appearing or diaphoretic.  Pulmonary:     Effort: Pulmonary effort is normal.  Neurological:     Mental Status: She is alert and oriented to person, place, and time.  Psychiatric:        Mood and Affect: Mood normal.     Ortho Exam Bilateral knees full range of motion.  Significant crepitus with passive range of motion both knees.  No instability valgus varus stressing of either knee.  McMurray's is negative bilaterally.  No abnormal warmth erythema or effusion.  Tenderness along medial joint line of left knee.  Specialty Comments:  No specialty comments available.  Imaging: XR Knee 1-2 Views Left  Result Date: 09/19/2019 Left knee 2 views: No acute fractures.  Moderate patellofemoral arthritic changes.  Moderate to severe medial compartmental narrowing both knees seen on the  AP view.  Mild changes both knees on AP view of the lateral compartment with periarticular spurs.  Knees are well located.    PMFS History: Patient Active Problem List   Diagnosis Date Noted  . History of right hip replacement 08/31/2018  . Status post total replacement of right hip 07/12/2017  . Pain of right hip joint 05/25/2017  . Unilateral primary osteoarthritis, right hip 05/25/2017   Past Medical History:  Diagnosis Date  . Arthritis    knees  . Dental crowns present   . High cholesterol   . Hypertension    states under control with meds., has been on med > 20 yr.  . Hypothyroidism   . TFCC  (triangular fibrocartilage complex) tear 03/2017   right wrist/wrist    No family history on file.  Past Surgical History:  Procedure Laterality Date  . KNEE ARTHROSCOPY W/ MENISCAL REPAIR Left   . NASAL SEPTUM SURGERY    . TOTAL HIP ARTHROPLASTY Right 07/12/2017   Procedure: RIGHT TOTAL HIP ARTHROPLASTY ANTERIOR APPROACH;  Surgeon: Kathryne Hitch, MD;  Location: [facility] OR;  Service: Orthopedics;  Laterality: Right;  . TUBAL LIGATION    . WRIST ARTHROSCOPY WITH DEBRIDEMENT Right 04/12/2017   Procedure: RIGHT WRIST ARTHROSCOPY WITH DEBRIDEMENT;  Surgeon: Cindee Salt, MD;  Location: [facility];  Service: Orthopedics;  Laterality: Right;   Social History   Occupational History  . Not on file  Tobacco Use  . Smoking status: Never Smoker  . Smokeless tobacco: Never Used  Substance and Sexual Activity  . Alcohol use: No  . Drug use: No  . Sexual activity: Not on file

## 2019-12-27 DIAGNOSIS — H5213 Myopia, bilateral: Secondary | ICD-10-CM | POA: Diagnosis not present

## 2019-12-27 DIAGNOSIS — H524 Presbyopia: Secondary | ICD-10-CM | POA: Diagnosis not present

## 2019-12-27 DIAGNOSIS — H52221 Regular astigmatism, right eye: Secondary | ICD-10-CM | POA: Diagnosis not present

## 2019-12-27 DIAGNOSIS — H25813 Combined forms of age-related cataract, bilateral: Secondary | ICD-10-CM | POA: Diagnosis not present

## 2019-12-27 DIAGNOSIS — H2513 Age-related nuclear cataract, bilateral: Secondary | ICD-10-CM | POA: Diagnosis not present

## 2020-02-19 DIAGNOSIS — Z23 Encounter for immunization: Secondary | ICD-10-CM | POA: Diagnosis not present

## 2020-06-06 DIAGNOSIS — I10 Essential (primary) hypertension: Secondary | ICD-10-CM | POA: Diagnosis not present

## 2020-06-06 DIAGNOSIS — E782 Mixed hyperlipidemia: Secondary | ICD-10-CM | POA: Diagnosis not present

## 2020-06-06 DIAGNOSIS — E039 Hypothyroidism, unspecified: Secondary | ICD-10-CM | POA: Diagnosis not present

## 2020-06-11 DIAGNOSIS — G4733 Obstructive sleep apnea (adult) (pediatric): Secondary | ICD-10-CM | POA: Diagnosis not present

## 2020-06-11 DIAGNOSIS — F321 Major depressive disorder, single episode, moderate: Secondary | ICD-10-CM | POA: Diagnosis not present

## 2020-06-11 DIAGNOSIS — Z0000 Encounter for general adult medical examination without abnormal findings: Secondary | ICD-10-CM | POA: Diagnosis not present

## 2020-06-11 DIAGNOSIS — E039 Hypothyroidism, unspecified: Secondary | ICD-10-CM | POA: Diagnosis not present

## 2020-06-11 DIAGNOSIS — I10 Essential (primary) hypertension: Secondary | ICD-10-CM | POA: Diagnosis not present

## 2020-06-11 DIAGNOSIS — E782 Mixed hyperlipidemia: Secondary | ICD-10-CM | POA: Diagnosis not present

## 2020-06-11 DIAGNOSIS — Z6830 Body mass index (BMI) 30.0-30.9, adult: Secondary | ICD-10-CM | POA: Diagnosis not present

## 2020-06-11 DIAGNOSIS — M150 Primary generalized (osteo)arthritis: Secondary | ICD-10-CM | POA: Diagnosis not present

## 2020-06-23 DIAGNOSIS — E039 Hypothyroidism, unspecified: Secondary | ICD-10-CM | POA: Diagnosis not present

## 2020-06-23 DIAGNOSIS — E782 Mixed hyperlipidemia: Secondary | ICD-10-CM | POA: Diagnosis not present

## 2020-06-23 DIAGNOSIS — G4733 Obstructive sleep apnea (adult) (pediatric): Secondary | ICD-10-CM | POA: Diagnosis not present

## 2020-06-23 DIAGNOSIS — I10 Essential (primary) hypertension: Secondary | ICD-10-CM | POA: Diagnosis not present

## 2020-06-23 DIAGNOSIS — F321 Major depressive disorder, single episode, moderate: Secondary | ICD-10-CM | POA: Diagnosis not present

## 2020-06-26 ENCOUNTER — Other Ambulatory Visit: Payer: Self-pay | Admitting: Family Medicine

## 2020-06-26 DIAGNOSIS — Z1231 Encounter for screening mammogram for malignant neoplasm of breast: Secondary | ICD-10-CM

## 2020-07-01 DIAGNOSIS — E669 Obesity, unspecified: Secondary | ICD-10-CM | POA: Diagnosis not present

## 2020-07-01 DIAGNOSIS — G4733 Obstructive sleep apnea (adult) (pediatric): Secondary | ICD-10-CM | POA: Diagnosis not present

## 2020-07-01 DIAGNOSIS — E039 Hypothyroidism, unspecified: Secondary | ICD-10-CM | POA: Diagnosis not present

## 2020-07-01 DIAGNOSIS — I10 Essential (primary) hypertension: Secondary | ICD-10-CM | POA: Diagnosis not present

## 2020-07-30 ENCOUNTER — Ambulatory Visit: Payer: Medicare PPO | Admitting: Physician Assistant

## 2020-07-30 ENCOUNTER — Ambulatory Visit (INDEPENDENT_AMBULATORY_CARE_PROVIDER_SITE_OTHER): Payer: Medicare PPO

## 2020-07-30 ENCOUNTER — Encounter: Payer: Self-pay | Admitting: Physician Assistant

## 2020-07-30 DIAGNOSIS — Z96641 Presence of right artificial hip joint: Secondary | ICD-10-CM

## 2020-07-30 DIAGNOSIS — M7061 Trochanteric bursitis, right hip: Secondary | ICD-10-CM | POA: Diagnosis not present

## 2020-07-30 DIAGNOSIS — M1712 Unilateral primary osteoarthritis, left knee: Secondary | ICD-10-CM | POA: Diagnosis not present

## 2020-07-30 MED ORDER — LIDOCAINE HCL 1 % IJ SOLN
3.0000 mL | INTRAMUSCULAR | Status: AC | PRN
Start: 2020-07-30 — End: 2020-07-30
  Administered 2020-07-30: 3 mL

## 2020-07-30 MED ORDER — LIDOCAINE HCL 1 % IJ SOLN
3.0000 mL | INTRAMUSCULAR | Status: AC | PRN
  Administered 2020-07-30: 3 mL

## 2020-07-30 MED ORDER — METHYLPREDNISOLONE ACETATE 40 MG/ML IJ SUSP
40.0000 mg | INTRAMUSCULAR | Status: AC | PRN
Start: 2020-07-30 — End: 2020-07-30
  Administered 2020-07-30: 40 mg via INTRA_ARTICULAR

## 2020-07-30 MED ORDER — METHYLPREDNISOLONE ACETATE 40 MG/ML IJ SUSP
40.0000 mg | INTRAMUSCULAR | Status: AC | PRN
  Administered 2020-07-30: 40 mg via INTRA_ARTICULAR

## 2020-07-30 NOTE — Progress Notes (Signed)
Office Visit Note   Patient: Jean Baldwin           Date of Birth: 10/04/1950           MRN: [id number] Visit Date: 07/30/2020              Requested by: Jean Mylar, MD [address] [place],  [place] [address] PCP: Jean Mylar, MD   Assessment & Plan: Visit Diagnoses:  1. History of right hip replacement   2. Primary osteoarthritis of left knee   3. Trochanteric bursitis, right hip     Plan: We will have her work on IT band stretching exercises as shown.  She will work on Dance movement psychotherapist.  Follow-up with [place] as needed understands she needs to wait at least 3 months between injections.  Questions were encouraged and answered at length.  Follow-Up Instructions: Return if symptoms worsen or fail to improve.   Orders:  Orders Placed This Encounter  Procedures  . Large Joint Inj  . Large Joint Inj  . XR HIP UNILAT W OR W/O PELVIS 2-3 VIEWS RIGHT   No orders of the defined types were placed in this encounter.     Procedures: Large Joint Inj: R greater trochanter on 07/30/2020 5:58 PM Indications: pain Details: 22 G 1.5 in needle, lateral approach  Arthrogram: No  Medications: 3 mL lidocaine 1 %; 40 mg methylPREDNISolone acetate 40 MG/ML Outcome: tolerated well, no immediate complications Procedure, treatment alternatives, risks and benefits explained, specific risks discussed. Consent was given by the patient. Immediately prior to procedure a time out was called to verify the correct patient, procedure, equipment, support staff and site/side marked as required. Patient was prepped and draped in the usual sterile fashion.   Large Joint Inj: L knee on 07/30/2020 5:58 PM Indications: pain Details: 22 G 1.5 in needle, anterolateral approach  Arthrogram: No  Medications: 3 mL lidocaine 1 %; 40 mg methylPREDNISolone acetate 40 MG/ML Outcome: tolerated well, no immediate complications Procedure, treatment alternatives, risks and benefits explained,  specific risks discussed. Consent was given by the patient. Immediately prior to procedure a time out was called to verify the correct patient, procedure, equipment, support staff and site/side marked as required. Patient was prepped and draped in the usual sterile fashion.       Clinical Data: No additional findings.   Subjective: Chief Complaint  Patient presents with  . Left Knee - Pain  . Right Hip - Pain    HPI [name] well-known to Dr. Magnus Baldwin service has known osteoarthritis left knee.  She also underwent right total hip arthroplasty by Dr. Magnus Baldwin in 2019.  Is having burning pain in her left knee popping.  Injection back in May 2021 helped.  She has had no new injury to the left knee.  She has increased pain in the right hip as of a week ago.  Pain with putting weight down.  No numbness tingling no groin pain.  No back pain. Review of Systems Denies any fevers chills shortness of breath chest pain.  Objective: Vital Signs: There were no vitals taken for this visit.  Physical Exam General: Well-developed well-nourished female no acute distress mood and affect appropriate.  Psych alert and oriented x3 Ortho Exam Bilateral hips good range of motion both hips without pain.  Tenderness over the right hip trochanteric region and down the IT band.  Bilateral knees she has patellofemoral crepitus.  Good range of motion of both knees.  Left  knee tenderness along medial joint line.  No instability valgus varus stressing of either knee.  Calf supple nontender bilaterally. Specialty Comments:  No specialty comments available.  Imaging: XR HIP UNILAT W OR W/O PELVIS 2-3 VIEWS RIGHT  Result Date: 07/30/2020 AP pelvis lateral view of the right hip: No acute fracture.  Both hips are well located.  Status post right total hip arthroplasty with well-seated components.    PMFS History: Patient Active Problem List   Diagnosis Date Noted  . Primary osteoarthritis of left knee  09/19/2019  . History of right hip replacement 08/31/2018  . Status post total replacement of right hip 07/12/2017  . Pain of right hip joint 05/25/2017  . Unilateral primary osteoarthritis, right hip 05/25/2017   Past Medical History:  Diagnosis Date  . Arthritis    knees  . Dental crowns present   . High cholesterol   . Hypertension    states under control with meds., has been on med > 20 yr.  . Hypothyroidism   . TFCC (triangular fibrocartilage complex) tear 03/2017   right wrist/wrist    History reviewed. No pertinent family history.  Past Surgical History:  Procedure Laterality Date  . KNEE ARTHROSCOPY W/ MENISCAL REPAIR Left   . NASAL SEPTUM SURGERY    . TOTAL HIP ARTHROPLASTY Right 07/12/2017   Procedure: RIGHT TOTAL HIP ARTHROPLASTY ANTERIOR APPROACH;  Surgeon: Jean Hitch, MD;  Location: [facility];  Service: Orthopedics;  Laterality: Right;  . TUBAL LIGATION    . WRIST ARTHROSCOPY WITH DEBRIDEMENT Right 04/12/2017   Procedure: RIGHT WRIST ARTHROSCOPY WITH DEBRIDEMENT;  Surgeon: Jean Salt, MD;  Location: [facility];  Service: Orthopedics;  Laterality: Right;   Social History   Occupational History  . Not on file  Tobacco Use  . Smoking status: Never Smoker  . Smokeless tobacco: Never Used  Vaping Use  . Vaping Use: Never used  Substance and Sexual Activity  . Alcohol use: No  . Drug use: No  . Sexual activity: Not on file

## 2020-08-11 DIAGNOSIS — E039 Hypothyroidism, unspecified: Secondary | ICD-10-CM | POA: Diagnosis not present

## 2020-08-14 ENCOUNTER — Other Ambulatory Visit: Payer: Self-pay

## 2020-08-14 ENCOUNTER — Ambulatory Visit
Admission: RE | Admit: 2020-08-14 | Discharge: 2020-08-14 | Disposition: A | Discharge status: Home / Self Care | Payer: Medicare PPO | Source: Ambulatory Visit | Attending: Family Medicine | Admitting: Family Medicine

## 2020-08-14 DIAGNOSIS — Z1231 Encounter for screening mammogram for malignant neoplasm of breast: Secondary | ICD-10-CM | POA: Diagnosis not present

## 2020-11-05 DIAGNOSIS — L089 Local infection of the skin and subcutaneous tissue, unspecified: Secondary | ICD-10-CM | POA: Diagnosis not present

## 2020-12-29 ENCOUNTER — Encounter: Payer: Self-pay | Admitting: Podiatry

## 2020-12-29 ENCOUNTER — Ambulatory Visit: Payer: Medicare PPO | Admitting: Podiatry

## 2020-12-29 ENCOUNTER — Other Ambulatory Visit: Payer: Self-pay

## 2020-12-29 ENCOUNTER — Ambulatory Visit (INDEPENDENT_AMBULATORY_CARE_PROVIDER_SITE_OTHER): Payer: Medicare PPO

## 2020-12-29 DIAGNOSIS — L03031 Cellulitis of right toe: Secondary | ICD-10-CM

## 2020-12-29 DIAGNOSIS — M79671 Pain in right foot: Secondary | ICD-10-CM

## 2020-12-29 NOTE — Patient Instructions (Signed)

## 2020-12-30 NOTE — Progress Notes (Signed)
Subjective:   Patient ID: Jean Baldwin, female   DOB: 70 y.o.   MRN: [id number]   HPI Patient presents stating she had a pedicure and has developed an infection in her right big toe since then when it was cut.  States she has been on 2 different antibiotics and it was 10 weeks ago that this occurred and it is painful.  Patient does not smoke and does like to be active   Review of Systems  All other systems reviewed and are negative.      Objective:  Physical Exam Vitals and nursing note reviewed.  Constitutional:      Appearance: She is well-developed.  Pulmonary:     Effort: Pulmonary effort is normal.  Musculoskeletal:        General: Normal range of motion.  Skin:    General: Skin is warm.  Neurological:     Mental Status: She is alert.    Neurovascular status intact muscle strength found to be adequate range of motion within normal limits.  Patient right hallux lateral border is red and irritated with necrotic tissue and incurvation of the nailbed with pain and patient is found to have good digital perfusion and is well oriented x3     Assessment:  Inflammatory paronychia infection of the right hallux lateral border with pain     Plan:  H&P reviewed condition discussed the reasons why it occurred and treatment.  I explained this may require further procedure but at this point organ to be conservative and as precautionary measure I did x-ray due to 10-week history.  I infiltrated the right hallux 60 mg Xylocaine Marcaine mixture sterile prep done and using sterile instrumentation remove the lateral border removed proud flesh necrotic tissue flushed the area found to be local with no proximal spread and applied sterile dressing.  Gave instructions on soaks and reappoint encouraging her to call with any questions concerns which may come up  X-ray was negative for signs of any osteolysis or bone pathology associated with condition

## 2021-01-02 ENCOUNTER — Other Ambulatory Visit: Payer: Self-pay | Admitting: Podiatry

## 2021-01-02 DIAGNOSIS — L03031 Cellulitis of right toe: Secondary | ICD-10-CM

## 2021-01-07 ENCOUNTER — Ambulatory Visit (INDEPENDENT_AMBULATORY_CARE_PROVIDER_SITE_OTHER): Payer: Medicare PPO

## 2021-01-07 ENCOUNTER — Other Ambulatory Visit: Payer: Self-pay | Admitting: Podiatry

## 2021-01-07 ENCOUNTER — Encounter: Payer: Self-pay | Admitting: Podiatry

## 2021-01-07 ENCOUNTER — Other Ambulatory Visit: Payer: Self-pay

## 2021-01-07 ENCOUNTER — Ambulatory Visit: Payer: Medicare PPO | Admitting: Podiatry

## 2021-01-07 DIAGNOSIS — M7751 Other enthesopathy of right foot: Secondary | ICD-10-CM

## 2021-01-07 DIAGNOSIS — L03031 Cellulitis of right toe: Secondary | ICD-10-CM

## 2021-01-07 DIAGNOSIS — M79671 Pain in right foot: Secondary | ICD-10-CM

## 2021-01-07 DIAGNOSIS — L600 Ingrowing nail: Secondary | ICD-10-CM

## 2021-01-07 MED ORDER — DOXYCYCLINE HYCLATE 100 MG PO TABS: 100.0000 mg | ORAL_TABLET | Freq: Two times a day (BID) | ORAL | 1 refills | Status: DC

## 2021-01-07 NOTE — Progress Notes (Signed)
Subjective:   Patient ID: Jean Baldwin, female   DOB: 70 y.o.   MRN: [id number]   HPI Patient states the end of her right big toe is doing well but more at the base it still red and swollen mildly tender   ROS      Objective:  Physical Exam  Neurovascular status intact doing well from paronychia distal excision right with proximal localized erythema no active drainage no proximal erythema edema drainage from this area noted     Assessment:  Low-grade paronychia infection right hallux more proximal with ingrown component     Plan:  H&P reviewed condition recommended continued soaks and placed on oral antibiotic as precautionary measure.  If this persist will need to take more nail and free the proximal bed and I made her aware of this  Due to the persistent redness I did rex-ray I did not see signs of osteolysis or indication of bone infection

## 2021-01-22 DIAGNOSIS — H5213 Myopia, bilateral: Secondary | ICD-10-CM | POA: Diagnosis not present

## 2021-01-22 DIAGNOSIS — H524 Presbyopia: Secondary | ICD-10-CM | POA: Diagnosis not present

## 2021-01-22 DIAGNOSIS — H52221 Regular astigmatism, right eye: Secondary | ICD-10-CM | POA: Diagnosis not present

## 2021-01-22 DIAGNOSIS — H2513 Age-related nuclear cataract, bilateral: Secondary | ICD-10-CM | POA: Diagnosis not present

## 2021-01-29 ENCOUNTER — Telehealth: Payer: Medicare PPO | Admitting: *Deleted

## 2021-01-29 NOTE — Telephone Encounter (Signed)
She can also take the refill

## 2021-01-29 NOTE — Telephone Encounter (Signed)
Patient is calling because her toe is infected again(draining,sensitive to touch and red on bottom),has one more refill on bottle,should she refill it again or come in to be reevaluated?This will be her 4th round of antibiotics.Please advise.

## 2021-01-29 NOTE — Telephone Encounter (Signed)
Patient has been scheduled for 11:45, arrival time is 11:30 w/ Dr Charlsie Merles, requesting to be seen instead of taking another round of antibiotics.  Called patient, no answer, left vmessage to call back to confirm appointment.

## 2021-01-29 NOTE — Telephone Encounter (Signed)
Come in tomorrow am

## 2021-01-30 ENCOUNTER — Ambulatory Visit: Payer: Medicare PPO | Admitting: Podiatry

## 2021-01-30 ENCOUNTER — Encounter: Payer: Self-pay | Admitting: Podiatry

## 2021-01-30 ENCOUNTER — Other Ambulatory Visit: Payer: Self-pay

## 2021-01-30 DIAGNOSIS — L03031 Cellulitis of right toe: Secondary | ICD-10-CM | POA: Diagnosis not present

## 2021-01-30 NOTE — Telephone Encounter (Signed)
Spoke with patient, confirmed appointment.

## 2021-01-30 NOTE — Patient Instructions (Signed)

## 2021-01-30 NOTE — Progress Notes (Signed)
Subjective:   Patient ID: Jean Baldwin, female   DOB: 70 y.o.   MRN: [id number]   HPI Patient states this is continuing to give me problems on my right big toe and it is now more at the bottom that it is the top   ROS      Objective:  Physical Exam  Patient has significant crusted tissue on the right hallux on the lateral side with crusted like tissue formation and localized redness no proximal edema erythema drainage noted     Assessment:  Paronychia infection of the right hallux     Plan:  H&P reviewed condition today reanesthetized the digit 60 g liken Marcaine mixture using sterile instruments I opened up the proximal portion taken out a portion of nail necrotic tissue flushed and it appears to be localized and hopefully will heal uneventfully.  If symptoms come back I will need to see back but I am hoping this will be the end of this problem with sterile dressing applied

## 2021-02-02 ENCOUNTER — Telehealth: Payer: Self-pay | Admitting: Family Medicine

## 2021-02-02 NOTE — Telephone Encounter (Signed)
Please advise 

## 2021-02-02 NOTE — Telephone Encounter (Signed)
Please call to schedule appointment.

## 2021-02-02 NOTE — Telephone Encounter (Signed)
Patient called to request Dr. Tanya Nones take over her care; her current provider is no longer practicing with the office where she was receiving care. Her husband donald Minchew is a patient of Dr. Caren Macadam.   Please advise at [phone].

## 2021-02-03 ENCOUNTER — Telehealth: Payer: Self-pay | Admitting: *Deleted

## 2021-02-03 NOTE — Telephone Encounter (Signed)
Patient is calling because her toe is draining  worse, red and sore after last visit, has been soaking as instructed.  She has one refill of doxycycline remaining, should she start that or is there something else she should try. Please advise.

## 2021-02-05 NOTE — Telephone Encounter (Signed)
Please schedule w/ Dr Charlsie Merles

## 2021-02-09 DIAGNOSIS — G4733 Obstructive sleep apnea (adult) (pediatric): Secondary | ICD-10-CM | POA: Diagnosis not present

## 2021-02-10 NOTE — Telephone Encounter (Signed)
New patient appt scheduled.

## 2021-02-13 ENCOUNTER — Encounter: Payer: Self-pay | Admitting: Podiatry

## 2021-02-13 ENCOUNTER — Other Ambulatory Visit: Payer: Self-pay

## 2021-02-13 ENCOUNTER — Ambulatory Visit: Payer: Medicare PPO | Admitting: Podiatry

## 2021-02-13 DIAGNOSIS — L03031 Cellulitis of right toe: Secondary | ICD-10-CM | POA: Diagnosis not present

## 2021-02-13 NOTE — Progress Notes (Signed)
Subjective:   Patient ID: Jean Baldwin, female   DOB: 71 y.o.   MRN: [id number]   HPI Patient presents concerned about some continued redness in the right big toe stating she is finishing antibiotics on Sunday and is concerned about infection   ROS      Objective:  Physical Exam  Neurovascular status intact with light redness on the lateral side of the right hallux in the proximal nail fold with no active drainage noted currently no discomfort noted with improvement which is occurred over the last few days     Assessment:  Frustrating paronychia infection created by pedicure which has been lingering but appears at this point to be resolving after aggressive treatment several weeks ago     Plan:  H&P reviewed condition recommended the continuation of soaks and that the crust should fall off and ultimately this should be the end of the problem.  I did discuss complete removal of the nail if symptoms remain and will be seen back and we will not renew her antibiotic currently

## 2021-02-16 DIAGNOSIS — G4733 Obstructive sleep apnea (adult) (pediatric): Secondary | ICD-10-CM | POA: Diagnosis not present

## 2021-02-16 DIAGNOSIS — I10 Essential (primary) hypertension: Secondary | ICD-10-CM | POA: Diagnosis not present

## 2021-02-16 DIAGNOSIS — E039 Hypothyroidism, unspecified: Secondary | ICD-10-CM | POA: Diagnosis not present

## 2021-02-16 DIAGNOSIS — G8929 Other chronic pain: Secondary | ICD-10-CM | POA: Diagnosis not present

## 2021-02-16 DIAGNOSIS — F419 Anxiety disorder, unspecified: Secondary | ICD-10-CM | POA: Diagnosis not present

## 2021-02-16 DIAGNOSIS — M1990 Unspecified osteoarthritis, unspecified site: Secondary | ICD-10-CM | POA: Diagnosis not present

## 2021-02-16 DIAGNOSIS — E669 Obesity, unspecified: Secondary | ICD-10-CM | POA: Diagnosis not present

## 2021-02-16 DIAGNOSIS — F3341 Major depressive disorder, recurrent, in partial remission: Secondary | ICD-10-CM | POA: Diagnosis not present

## 2021-02-16 DIAGNOSIS — E785 Hyperlipidemia, unspecified: Secondary | ICD-10-CM | POA: Diagnosis not present

## 2021-02-20 ENCOUNTER — Other Ambulatory Visit: Payer: Self-pay

## 2021-02-20 ENCOUNTER — Encounter: Payer: Self-pay | Admitting: Family Medicine

## 2021-02-20 ENCOUNTER — Ambulatory Visit: Payer: Medicare PPO | Admitting: Family Medicine

## 2021-02-20 VITALS — BP 130/68 | HR 70 | Temp 98.7°F | Resp 16 | Ht 63.0 in | Wt 178.0 lb

## 2021-02-20 DIAGNOSIS — Z7689 Persons encountering health services in other specified circumstances: Secondary | ICD-10-CM

## 2021-02-20 DIAGNOSIS — I10 Essential (primary) hypertension: Secondary | ICD-10-CM | POA: Diagnosis not present

## 2021-02-20 DIAGNOSIS — E7800 Pure hypercholesterolemia, unspecified: Secondary | ICD-10-CM | POA: Diagnosis not present

## 2021-02-20 DIAGNOSIS — Z23 Encounter for immunization: Secondary | ICD-10-CM

## 2021-02-20 NOTE — Progress Notes (Signed)
Subjective:    Patient ID: Edward Jolly, female    DOB: 11/27/1950, 70 y.o.   MRN: [id number]  HPI  Patient is a very pleasant 70 year old Caucasian female here today to establish care.  She has a history of hypertension, hyperlipidemia, and hypothyroidism.  She is also had several surgeries for arthritic conditions particularly in her knee and wrist.  She denies any issues today.  She is currently on Lipitor as well as Niaspan for her cholesterol.  We discussed the aim high study and I recommended discontinuing Niaspan due to his lack of efficacy.  Her blood pressure today is well controlled.  She denies any chest pain shortness of breath or dyspnea on exertion.  Her mammogram was performed earlier this year in April and is up-to-date.  She states that her colonoscopy is up-to-date but she is not sure when she had it.  I would like to get records of this.  She had a bone density test in 2021.  She has osteopenia.  She does not require Pap smear due to age.  She is due today for a flu shot.  She is also due for a COVID booster.  She has had Pneumovax 23, Prevnar 20, and Shingrix. Past Medical History:  Diagnosis Date   Arthritis    knees   Dental crowns present    High cholesterol    Hypertension    states under control with meds., has been on med > 20 yr.   Hypothyroidism    Osteopenia    TFCC (triangular fibrocartilage complex) tear 03/2017   right wrist/wrist   Past Surgical History:  Procedure Laterality Date   KNEE ARTHROSCOPY W/ MENISCAL REPAIR Left    NASAL SEPTUM SURGERY     TOTAL HIP ARTHROPLASTY Right 07/12/2017   Procedure: RIGHT TOTAL HIP ARTHROPLASTY ANTERIOR APPROACH;  Surgeon: Kathryne Hitch, MD;  Location: [facility];  Service: Orthopedics;  Laterality: Right;   TUBAL LIGATION     WRIST ARTHROSCOPY WITH DEBRIDEMENT Right 04/12/2017   Procedure: RIGHT WRIST ARTHROSCOPY WITH DEBRIDEMENT;  Surgeon: Cindee Salt, MD;  Location: [facility];  Service:  Orthopedics;  Laterality: Right;   Current Outpatient Medications on File Prior to Visit  Medication Sig Dispense Refill   amLODipine (NORVASC) 5 MG tablet Take 5 mg by mouth in the morning and at bedtime.     Apple Cider Vinegar 600 MG CAPS Take by mouth.     aspirin 81 MG chewable tablet Chew 1 tablet (81 mg total) by mouth 2 (two) times daily. 35 tablet 0   atorvastatin (LIPITOR) 20 MG tablet Take 20 mg by mouth every evening.      Calcium Carb-Cholecalciferol (CALCIUM/VITAMIN D) 600-400 MG-UNIT TABS Take 2 tablets by mouth daily.     cetirizine (ZYRTEC) 10 MG tablet Take 10 mg by mouth daily.     levothyroxine (SYNTHROID) 125 MCG tablet Take 125 mcg by mouth daily before breakfast.     losartan (COZAAR) 100 MG tablet Take 100 mg by mouth daily.      meloxicam (MOBIC) 15 MG tablet Take 15 mg by mouth daily.     sertraline (ZOLOFT) 50 MG tablet Take 1 tablet by mouth daily.     vitamin C (ASCORBIC ACID) 500 MG tablet Take 500 mg by mouth daily.     zinc gluconate 50 MG tablet Take 50 mg by mouth daily.     No current facility-administered medications on file prior to visit.   Marland Kitchenall Social  History   Socioeconomic History   Marital status: Married    Spouse name: Not on file   Number of children: Not on file   Years of education: Not on file   Highest education level: Not on file  Occupational History   Not on file  Tobacco Use   Smoking status: Never   Smokeless tobacco: Never  Vaping Use   Vaping Use: Never used  Substance and Sexual Activity   Alcohol use: No   Drug use: No   Sexual activity: Yes  Other Topics Concern   Not on file  Social History Narrative   Not on file   Social Determinants of Health   Financial Resource Strain: Not on file  Food Insecurity: Not on file  Transportation Needs: Not on file  Physical Activity: Not on file  Stress: Not on file  Social Connections: Not on file  Intimate Partner Violence: Not on file     Review of Systems  All  other systems reviewed and are negative.     Objective:   Physical Exam Constitutional:      Appearance: Normal appearance. She is normal weight.  Cardiovascular:     Rate and Rhythm: Normal rate and regular rhythm.     Pulses: Normal pulses.     Heart sounds: Normal heart sounds. No murmur heard.   No gallop.  Pulmonary:     Effort: Pulmonary effort is normal. No respiratory distress.     Breath sounds: Normal breath sounds. No stridor. No wheezing, rhonchi or rales.  Abdominal:     General: Bowel sounds are normal.     Palpations: Abdomen is soft.  Musculoskeletal:     Right lower leg: No edema.     Left lower leg: No edema.  Neurological:     General: No focal deficit present.     Mental Status: She is alert and oriented to person, place, and time.          Assessment & Plan:   Need for immunization against influenza - Plan: Flu Vaccine QUAD High Dose(Fluad)  Benign essential HTN  Pure hypercholesterolemia  Establishing care with new doctor, encounter for Patient's mammogram, colonoscopy, and bone density are up-to-date.  She received her flu shot.  I recommended that she get a COVID booster.  Return fasting for a CBC CMP lipid panel and TSH.  Blood pressure today is well controlled.  Due for a physical exam in February

## 2021-02-24 ENCOUNTER — Other Ambulatory Visit: Payer: Self-pay

## 2021-02-24 ENCOUNTER — Other Ambulatory Visit: Payer: Medicare PPO

## 2021-02-24 DIAGNOSIS — Z7689 Persons encountering health services in other specified circumstances: Secondary | ICD-10-CM | POA: Diagnosis not present

## 2021-02-24 DIAGNOSIS — I10 Essential (primary) hypertension: Secondary | ICD-10-CM | POA: Diagnosis not present

## 2021-02-24 DIAGNOSIS — E7800 Pure hypercholesterolemia, unspecified: Secondary | ICD-10-CM | POA: Diagnosis not present

## 2021-02-25 ENCOUNTER — Encounter: Payer: Self-pay | Admitting: Family Medicine

## 2021-02-25 DIAGNOSIS — L812 Freckles: Secondary | ICD-10-CM | POA: Diagnosis not present

## 2021-02-25 DIAGNOSIS — D1801 Hemangioma of skin and subcutaneous tissue: Secondary | ICD-10-CM | POA: Diagnosis not present

## 2021-02-25 DIAGNOSIS — D2262 Melanocytic nevi of left upper limb, including shoulder: Secondary | ICD-10-CM | POA: Diagnosis not present

## 2021-02-25 DIAGNOSIS — L720 Epidermal cyst: Secondary | ICD-10-CM | POA: Diagnosis not present

## 2021-02-25 DIAGNOSIS — L7211 Pilar cyst: Secondary | ICD-10-CM | POA: Diagnosis not present

## 2021-02-25 DIAGNOSIS — L820 Inflamed seborrheic keratosis: Secondary | ICD-10-CM | POA: Diagnosis not present

## 2021-02-25 DIAGNOSIS — D225 Melanocytic nevi of trunk: Secondary | ICD-10-CM | POA: Diagnosis not present

## 2021-02-25 DIAGNOSIS — D485 Neoplasm of uncertain behavior of skin: Secondary | ICD-10-CM | POA: Diagnosis not present

## 2021-02-25 DIAGNOSIS — D2372 Other benign neoplasm of skin of left lower limb, including hip: Secondary | ICD-10-CM | POA: Diagnosis not present

## 2021-02-25 DIAGNOSIS — L821 Other seborrheic keratosis: Secondary | ICD-10-CM | POA: Diagnosis not present

## 2021-02-25 LAB — LIPID PANEL
Cholesterol: 146 mg/dL (ref <200)
HDL: 62 mg/dL (ref >=50)
LDL Cholesterol (Calc): 69 mg/dL (calc)
Non-HDL Cholesterol (Calc): 84 mg/dL (calc) (ref <130)
Total CHOL/HDL Ratio: 2.4 (calc) (ref <5.0)
Triglycerides: 73 mg/dL (ref <150)

## 2021-02-25 LAB — COMPLETE METABOLIC PANEL WITH GFR
AG Ratio: 1.9 (calc) (ref 1.0–2.5)
ALT: 17 U/L (ref 6–29)
AST: 18 U/L (ref 10–35)
Albumin: 4.1 g/dL (ref 3.6–5.1)
Alkaline phosphatase (APISO): 117 U/L (ref 37–153)
BUN: 13 mg/dL (ref 7–25)
CO2: 26 mmol/L (ref 20–32)
Calcium: 9.5 mg/dL (ref 8.6–10.4)
Chloride: 107 mmol/L (ref 98–110)
Creat: 0.62 mg/dL (ref 0.60–1.00)
Globulin: 2.2 g/dL (calc) (ref 1.9–3.7)
Glucose, Bld: 109 mg/dL — ABNORMAL HIGH (ref 65–99)
Potassium: 4.4 mmol/L (ref 3.5–5.3)
Sodium: 142 mmol/L (ref 135–146)
Total Bilirubin: 0.3 mg/dL (ref 0.2–1.2)
Total Protein: 6.3 g/dL (ref 6.1–8.1)
eGFR: 95 mL/min/{1.73_m2} (ref >=60)

## 2021-02-25 LAB — CBC WITH DIFFERENTIAL/PLATELET
Absolute Monocytes: 568 cells/uL (ref 200–950)
Basophils Absolute: 79 cells/uL (ref 0–200)
Basophils Relative: 1.2 %
Eosinophils Absolute: 660 cells/uL — ABNORMAL HIGH (ref 15–500)
Eosinophils Relative: 10 %
HCT: 35.9 % (ref 35.0–45.0)
Hemoglobin: 11.3 g/dL — ABNORMAL LOW (ref 11.7–15.5)
Lymphs Abs: 1175 cells/uL (ref 850–3900)
MCH: 26.9 pg — ABNORMAL LOW (ref 27.0–33.0)
MCHC: 31.5 g/dL — ABNORMAL LOW (ref 32.0–36.0)
MCV: 85.5 fL (ref 80.0–100.0)
MPV: 11.9 fL (ref 7.5–12.5)
Monocytes Relative: 8.6 %
Neutro Abs: 4118 cells/uL (ref 1500–7800)
Neutrophils Relative %: 62.4 %
Platelets: 290 10*3/uL (ref 140–400)
RBC: 4.2 10*6/uL (ref 3.80–5.10)
RDW: 13.6 % (ref 11.0–15.0)
Total Lymphocyte: 17.8 %
WBC: 6.6 10*3/uL (ref 3.8–10.8)

## 2021-02-25 LAB — TSH: TSH: 0.07 mIU/L — ABNORMAL LOW (ref 0.40–4.50)

## 2021-02-27 ENCOUNTER — Telehealth: Payer: Self-pay | Admitting: *Deleted

## 2021-02-27 DIAGNOSIS — E039 Hypothyroidism, unspecified: Secondary | ICD-10-CM

## 2021-02-27 NOTE — Telephone Encounter (Signed)
Received call from patient.   Requested to discuss results of labs obtained on 02/24/2021.  It does not look like the labs were reviewed.   Please advise.

## 2021-03-02 DIAGNOSIS — E039 Hypothyroidism, unspecified: Secondary | ICD-10-CM | POA: Insufficient documentation

## 2021-03-02 MED ORDER — LEVOTHYROXINE SODIUM 88 MCG PO TABS: 88.0000 ug | ORAL_TABLET | Freq: Every day | ORAL | 0 refills | Status: DC

## 2021-03-02 NOTE — Telephone Encounter (Signed)
Call placed to patient and patient made aware.   Agreeable to plan.   Prescription sent to pharmacy.   Future lab orders placed.

## 2021-03-02 NOTE — Addendum Note (Signed)
Addended by: Phillips Odor on: 03/02/2021 11:34 AM   Modules accepted: Orders

## 2021-03-12 DIAGNOSIS — G4733 Obstructive sleep apnea (adult) (pediatric): Secondary | ICD-10-CM | POA: Diagnosis not present

## 2021-03-19 ENCOUNTER — Telehealth: Payer: Self-pay | Admitting: *Deleted

## 2021-03-19 ENCOUNTER — Ambulatory Visit: Payer: Self-pay

## 2021-03-19 ENCOUNTER — Ambulatory Visit: Payer: Medicare PPO | Admitting: Physician Assistant

## 2021-03-19 ENCOUNTER — Encounter: Payer: Self-pay | Admitting: Physician Assistant

## 2021-03-19 DIAGNOSIS — M79672 Pain in left foot: Secondary | ICD-10-CM

## 2021-03-19 DIAGNOSIS — M19072 Primary osteoarthritis, left ankle and foot: Secondary | ICD-10-CM

## 2021-03-19 DIAGNOSIS — M1712 Unilateral primary osteoarthritis, left knee: Secondary | ICD-10-CM

## 2021-03-19 NOTE — Progress Notes (Signed)
Office Visit Note   Patient: Jean Baldwin           Date of Birth: 05/11/1950           MRN: [id number] Visit Date: 03/19/2021              Requested by: Donita Brooks, MD [address] [place],  [place] [address] PCP: Donita Brooks, MD   Assessment & Plan: Visit Diagnoses:  1. Primary osteoarthritis of left knee   2. Pain in left foot   3. Primary osteoarthritis of left foot     Plan: Offer cortisone injection left knee she defers.  She will begin using Voltaren gel on her knee 4 g 4 times daily 2 g 4 times daily on the left foot.  She wants to stay away from oral NSAIDs and was asking about natural anti-inflammatories recommended that she try turmeric.  She will follow-up with [place] as needed.  Questions encouraged and answered  Follow-Up Instructions: Return if symptoms worsen or fail to improve.   Orders:  Orders Placed This Encounter  Procedures   XR Foot Complete Left   XR Knee 1-2 Views Left   No orders of the defined types were placed in this encounter.     Procedures: No procedures performed   Clinical Data: No additional findings.   Subjective: Chief Complaint  Patient presents with   Left Foot - Pain   Left Knee - Pain    HPI Jean Baldwin comes in today due to left foot pain and left knee pain.  She has known osteoarthritis of the left knee.  Last injection was in March of this year and did well until recently.  She reports that she stopped her Mobic recently and has had multiple aches and pains since then including her left foot.  She has had no new injury to the left foot or the left knee.  She has swelling dorsal aspect left foot.  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  Review of Systems See HPI.  Objective: Vital Signs: There were no vitals taken for this visit.  Physical Exam Pulmonary:     Effort: Pulmonary  effort is normal.  Neurological:     Mental Status: She is oriented to person, place, and time.  Psychiatric:        Mood and Affect: Mood normal.    Ortho Exam Left knee good range of motion.  Tello femoral crepitus no abnormal warmth erythema. Left foot dorsal pedal pulse 2+.  No rashes skin lesions ulcerations.  She has dorsal prominence over the second TMT joint region.  PNA acute toe second metatarsal causes pain.  Main of the foot is nontender.  5 out of 5 strength with inversion eversion against resistance left foot.  Specialty Comments:  No specialty comments available.  Imaging: XR Foot Complete Left  Result Date: 03/19/2021 Left foot 3 views: Dorsal osteophyte at the second tarsometatarsal joint.  Previous trauma with absence of first distal phalanx.  No other acute fractures bony abnormalities.  XR Knee 1-2 Views Left  Result Date: 03/19/2021 Left knee 3 views: Knee is well located.  Tricompartmental arthritis with near bone-on-bone medial compartment.  Moderate patellofemoral changes.  Mild to moderate lateral compartmental changes with periarticular spurring.    PMFS History: Patient Active Problem List  Diagnosis Date Noted   Hypothyroidism 03/02/2021   Primary osteoarthritis of left knee 09/19/2019   History of right hip replacement 08/31/2018   Status post total replacement of right hip 07/12/2017   Pain of right hip joint 05/25/2017   Unilateral primary osteoarthritis, right hip 05/25/2017   Past Medical History:  Diagnosis Date   Arthritis    knees   Dental crowns present    High cholesterol    Hypertension    states under control with meds., has been on med > 20 yr.   Hypothyroidism    Osteopenia    TFCC (triangular fibrocartilage complex) tear 03/2017   right wrist/wrist    History reviewed. No pertinent family history.  Past Surgical History:  Procedure Laterality Date   KNEE ARTHROSCOPY W/ MENISCAL REPAIR Left    NASAL SEPTUM SURGERY      TOTAL HIP ARTHROPLASTY Right 07/12/2017   Procedure: RIGHT TOTAL HIP ARTHROPLASTY ANTERIOR APPROACH;  Surgeon: Kathryne Hitch, MD;  Location: [facility];  Service: Orthopedics;  Laterality: Right;   TUBAL LIGATION     WRIST ARTHROSCOPY WITH DEBRIDEMENT Right 04/12/2017   Procedure: RIGHT WRIST ARTHROSCOPY WITH DEBRIDEMENT;  Surgeon: Cindee Salt, MD;  Location: [facility];  Service: Orthopedics;  Laterality: Right;   Social History   Occupational History   Not on file  Tobacco Use   Smoking status: Never   Smokeless tobacco: Never  Vaping Use   Vaping Use: Never used  Substance and Sexual Activity   Alcohol use: No   Drug use: No   Sexual activity: Yes

## 2021-03-19 NOTE — Telephone Encounter (Signed)
Received call from patient.   Reports that she was advised to stop Mobic. States that since she has stopped taking Mobic, her pain has increased. Reports that she is now in arthritis flare.   Patient was seen at orthopedic on 03/18/2021. States that she discussed arthritis flare and was advised to add Turmeric to supplements.   States that she was advised that Turmeric is not recommended with concurrent use of Lipitor and Amlodipine. No contraindications noted to supplement.   Please advise.

## 2021-03-20 NOTE — Telephone Encounter (Signed)
Of note, patient states that she stopped Mobic because she was concerned that it could cause harm to kidneys.

## 2021-03-20 NOTE — Telephone Encounter (Signed)
Call placed to patient and patient made aware.  

## 2021-04-06 DIAGNOSIS — M25572 Pain in left ankle and joints of left foot: Secondary | ICD-10-CM | POA: Diagnosis not present

## 2021-04-08 ENCOUNTER — Ambulatory Visit: Payer: Medicare PPO | Admitting: Podiatry

## 2021-04-15 DIAGNOSIS — G4733 Obstructive sleep apnea (adult) (pediatric): Secondary | ICD-10-CM | POA: Diagnosis not present

## 2021-05-07 DIAGNOSIS — M25572 Pain in left ankle and joints of left foot: Secondary | ICD-10-CM | POA: Diagnosis not present

## 2021-05-21 DIAGNOSIS — M25572 Pain in left ankle and joints of left foot: Secondary | ICD-10-CM | POA: Diagnosis not present

## 2021-05-26 ENCOUNTER — Other Ambulatory Visit: Payer: Self-pay | Admitting: Family Medicine

## 2021-06-10 ENCOUNTER — Other Ambulatory Visit: Payer: Self-pay

## 2021-06-10 DIAGNOSIS — M8580 Other specified disorders of bone density and structure, unspecified site: Secondary | ICD-10-CM | POA: Insufficient documentation

## 2021-06-10 DIAGNOSIS — F321 Major depressive disorder, single episode, moderate: Secondary | ICD-10-CM | POA: Insufficient documentation

## 2021-06-10 DIAGNOSIS — M1991 Primary osteoarthritis, unspecified site: Secondary | ICD-10-CM | POA: Insufficient documentation

## 2021-06-10 DIAGNOSIS — G4733 Obstructive sleep apnea (adult) (pediatric): Secondary | ICD-10-CM | POA: Insufficient documentation

## 2021-06-10 DIAGNOSIS — I10 Essential (primary) hypertension: Secondary | ICD-10-CM

## 2021-06-10 DIAGNOSIS — E7800 Pure hypercholesterolemia, unspecified: Secondary | ICD-10-CM | POA: Insufficient documentation

## 2021-06-10 DIAGNOSIS — R400 Somnolence: Secondary | ICD-10-CM | POA: Insufficient documentation

## 2021-06-10 DIAGNOSIS — E669 Obesity, unspecified: Secondary | ICD-10-CM | POA: Insufficient documentation

## 2021-06-10 DIAGNOSIS — N200 Calculus of kidney: Secondary | ICD-10-CM | POA: Insufficient documentation

## 2021-06-14 ENCOUNTER — Other Ambulatory Visit: Payer: Self-pay | Admitting: Family Medicine

## 2021-06-16 ENCOUNTER — Other Ambulatory Visit: Payer: Self-pay

## 2021-06-16 ENCOUNTER — Other Ambulatory Visit: Payer: Medicare PPO

## 2021-06-16 DIAGNOSIS — E7800 Pure hypercholesterolemia, unspecified: Secondary | ICD-10-CM | POA: Diagnosis not present

## 2021-06-16 DIAGNOSIS — I10 Essential (primary) hypertension: Secondary | ICD-10-CM

## 2021-06-16 DIAGNOSIS — E039 Hypothyroidism, unspecified: Secondary | ICD-10-CM

## 2021-06-16 LAB — COMPREHENSIVE METABOLIC PANEL
AG Ratio: 1.9 (calc) (ref 1.0–2.5)
ALT: 13 U/L (ref 6–29)
AST: 17 U/L (ref 10–35)
Albumin: 4.1 g/dL (ref 3.6–5.1)
Alkaline phosphatase (APISO): 113 U/L (ref 37–153)
BUN: 19 mg/dL (ref 7–25)
CO2: 27 mmol/L (ref 20–32)
Calcium: 9.6 mg/dL (ref 8.6–10.4)
Chloride: 107 mmol/L (ref 98–110)
Creat: 0.72 mg/dL (ref 0.60–1.00)
Globulin: 2.2 g/dL (calc) (ref 1.9–3.7)
Glucose, Bld: 98 mg/dL (ref 65–99)
Potassium: 4.7 mmol/L (ref 3.5–5.3)
Sodium: 142 mmol/L (ref 135–146)
Total Bilirubin: 0.5 mg/dL (ref 0.2–1.2)
Total Protein: 6.3 g/dL (ref 6.1–8.1)

## 2021-06-16 LAB — CBC WITH DIFFERENTIAL/PLATELET
Absolute Monocytes: 553 cells/uL (ref 200–950)
Basophils Absolute: 70 cells/uL (ref 0–200)
Basophils Relative: 1 %
Eosinophils Absolute: 644 cells/uL — ABNORMAL HIGH (ref 15–500)
Eosinophils Relative: 9.2 %
HCT: 39.4 % (ref 35.0–45.0)
Hemoglobin: 12.5 g/dL (ref 11.7–15.5)
Lymphs Abs: 1155 cells/uL (ref 850–3900)
MCH: 27.1 pg (ref 27.0–33.0)
MCHC: 31.7 g/dL — ABNORMAL LOW (ref 32.0–36.0)
MCV: 85.5 fL (ref 80.0–100.0)
MPV: 10.9 fL (ref 7.5–12.5)
Monocytes Relative: 7.9 %
Neutro Abs: 4578 cells/uL (ref 1500–7800)
Neutrophils Relative %: 65.4 %
Platelets: 310 10*3/uL (ref 140–400)
RBC: 4.61 10*6/uL (ref 3.80–5.10)
RDW: 15.2 % — ABNORMAL HIGH (ref 11.0–15.0)
Total Lymphocyte: 16.5 %
WBC: 7 10*3/uL (ref 3.8–10.8)

## 2021-06-16 LAB — LIPID PANEL
Cholesterol: 180 mg/dL (ref <200)
HDL: 73 mg/dL (ref >=50)
LDL Cholesterol (Calc): 92 mg/dL (calc)
Non-HDL Cholesterol (Calc): 107 mg/dL (calc) (ref <130)
Total CHOL/HDL Ratio: 2.5 (calc) (ref <5.0)
Triglycerides: 64 mg/dL (ref <150)

## 2021-06-16 LAB — TSH: TSH: 21.28 mIU/L — ABNORMAL HIGH (ref 0.40–4.50)

## 2021-06-18 ENCOUNTER — Other Ambulatory Visit: Payer: Self-pay

## 2021-06-18 MED ORDER — LEVOTHYROXINE SODIUM 100 MCG PO TABS: 100.0000 ug | ORAL_TABLET | Freq: Every day | ORAL | 3 refills | Status: DC

## 2021-06-23 ENCOUNTER — Other Ambulatory Visit: Payer: Self-pay

## 2021-06-23 ENCOUNTER — Ambulatory Visit (INDEPENDENT_AMBULATORY_CARE_PROVIDER_SITE_OTHER): Payer: Medicare PPO | Admitting: Family Medicine

## 2021-06-23 ENCOUNTER — Encounter: Payer: Self-pay | Admitting: Family Medicine

## 2021-06-23 VITALS — BP 138/72 | HR 72 | Temp 97.5°F | Resp 18 | Ht 62.5 in | Wt 180.0 lb

## 2021-06-23 DIAGNOSIS — I10 Essential (primary) hypertension: Secondary | ICD-10-CM

## 2021-06-23 DIAGNOSIS — Z0000 Encounter for general adult medical examination without abnormal findings: Secondary | ICD-10-CM | POA: Diagnosis not present

## 2021-06-23 DIAGNOSIS — E039 Hypothyroidism, unspecified: Secondary | ICD-10-CM

## 2021-06-23 DIAGNOSIS — M8580 Other specified disorders of bone density and structure, unspecified site: Secondary | ICD-10-CM

## 2021-06-23 DIAGNOSIS — E7800 Pure hypercholesterolemia, unspecified: Secondary | ICD-10-CM | POA: Diagnosis not present

## 2021-06-23 DIAGNOSIS — Z1211 Encounter for screening for malignant neoplasm of colon: Secondary | ICD-10-CM | POA: Diagnosis not present

## 2021-06-23 DIAGNOSIS — Z1231 Encounter for screening mammogram for malignant neoplasm of breast: Secondary | ICD-10-CM | POA: Diagnosis not present

## 2021-06-23 NOTE — Progress Notes (Signed)
Subjective:    Patient ID: Jean Baldwin, female    DOB: September 06, 1950, 71 y.o.   MRN: [id number]  HPI Patient is a very pleasant 71 year old Caucasian female here today for complete physical exam.  She states that she stopped meloxicam however the inflammation in her feet and her knees and her hip was severe and the pain became terrible so she had to resume meloxicam.  Otherwise she is doing well.  Reviewing her immunizations, she is up-to-date on all immunizations other than her tetanus shot.  She states that her last colonoscopy was about 2 years ago.  She had a tubular adenoma so they recommended a repeat colonoscopy in 5 years.  She would like [place] to schedule this for her.  She is also due for mammogram in April.  Her last Pap smear has been several years ago.  However she is past age 71 so she no longer requires that.  Her last bone density was 2 years ago.  T score was significant for osteopenia at -1.6.  She is due for repeat bone density next year.  Her most recent lab work is listed below and shows an elevated TSH on her previous dose of levothyroxine 88 mcg a day. Appointment on 06/16/2021  Component Date Value Ref Range Status   TSH 06/16/2021 21.28 (H)  0.40 - 4.50 mIU/L Final   Glucose, Bld 06/16/2021 98  65 - 99 mg/dL Final   Comment: .            Fasting reference interval .    BUN 06/16/2021 19  7 - 25 mg/dL Final   Creat 02/77/4128 0.72  0.60 - 1.00 mg/dL Final   BUN/Creatinine Ratio 06/16/2021 NOT APPLICABLE  6 - 22 (calc) Final   Sodium 06/16/2021 142  135 - 146 mmol/L Final   Potassium 06/16/2021 4.7  3.5 - 5.3 mmol/L Final   Chloride 06/16/2021 107  98 - 110 mmol/L Final   CO2 06/16/2021 27  20 - 32 mmol/L Final   Calcium 06/16/2021 9.6  8.6 - 10.4 mg/dL Final   Total Protein 78/67/6720 6.3  6.1 - 8.1 g/dL Final   Albumin 94/70/9628 4.1  3.6 - 5.1 g/dL Final   Globulin 36/62/9476 2.2  1.9 - 3.7 g/dL (calc) Final   AG Ratio 06/16/2021 1.9  1.0 - 2.5 (calc) Final    Total Bilirubin 06/16/2021 0.5  0.2 - 1.2 mg/dL Final   Alkaline phosphatase (APISO) 06/16/2021 113  37 - 153 U/L Final   AST 06/16/2021 17  10 - 35 U/L Final   ALT 06/16/2021 13  6 - 29 U/L Final   WBC 06/16/2021 7.0  3.8 - 10.8 Thousand/uL Final   RBC 06/16/2021 4.61  3.80 - 5.10 Million/uL Final   Hemoglobin 06/16/2021 12.5  11.7 - 15.5 g/dL Final   HCT 54/65/0354 39.4  35.0 - 45.0 % Final   MCV 06/16/2021 85.5  80.0 - 100.0 fL Final   MCH 06/16/2021 27.1  27.0 - 33.0 pg Final   MCHC 06/16/2021 31.7 (L)  32.0 - 36.0 g/dL Final   RDW 65/68/1275 15.2 (H)  11.0 - 15.0 % Final   Platelets 06/16/2021 310  140 - 400 Thousand/uL Final   MPV 06/16/2021 10.9  7.5 - 12.5 fL Final   Neutro Abs 06/16/2021 4,578  1,500 - 7,800 cells/uL Final   Lymphs Abs 06/16/2021 1,155  850 - 3,900 cells/uL Final   Absolute Monocytes 06/16/2021 553  200 - 950 cells/uL Final  Eosinophils Absolute 06/16/2021 644 (H)  15 - 500 cells/uL Final   Basophils Absolute 06/16/2021 70  0 - 200 cells/uL Final   Neutrophils Relative % 06/16/2021 65.4  % Final   Total Lymphocyte 06/16/2021 16.5  % Final   Monocytes Relative 06/16/2021 7.9  % Final   Eosinophils Relative 06/16/2021 9.2  % Final   Basophils Relative 06/16/2021 1.0  % Final   Cholesterol 06/16/2021 180  <200 mg/dL Final   HDL 78/67/6720 73  > OR = 50 mg/dL Final   Triglycerides 94/70/9628 64  <150 mg/dL Final   LDL Cholesterol (Calc) 06/16/2021 92  mg/dL (calc) Final   Comment: Reference range: <100 . Desirable range <100 mg/dL for primary prevention;   <70 mg/dL for patients with CHD or diabetic patients  with > or = 2 CHD risk factors. Marland Kitchen LDL-C is now calculated using the Martin-Hopkins  calculation, which is a validated novel method providing  better accuracy than the Friedewald equation in the  estimation of LDL-C.  Horald Pollen et al. Lenox Ahr. 3662;947(65): 2061-2068  (http://education.[facility].com/faq/FAQ164)    Total CHOL/HDL Ratio 06/16/2021  2.5  <5.0 (calc) Final   Non-HDL Cholesterol (Calc) 06/16/2021 107  <130 mg/dL (calc) Final   Comment: For patients with diabetes plus 1 major ASCVD risk  factor, treating to a non-HDL-C goal of <100 mg/dL  (LDL-C of <46 mg/dL) is considered a therapeutic  option.     Past Medical History:  Diagnosis Date   Arthritis    knees   Dental crowns present    High cholesterol    Hypertension    states under control with meds., has been on med > 20 yr.   Hypothyroidism    Osteopenia    TFCC (triangular fibrocartilage complex) tear 03/2017   right wrist/wrist   Past Surgical History:  Procedure Laterality Date   KNEE ARTHROSCOPY W/ MENISCAL REPAIR Left    NASAL SEPTUM SURGERY     TOTAL HIP ARTHROPLASTY Right 07/12/2017   Procedure: RIGHT TOTAL HIP ARTHROPLASTY ANTERIOR APPROACH;  Surgeon: Kathryne Hitch, MD;  Location: [facility];  Service: Orthopedics;  Laterality: Right;   TUBAL LIGATION     WRIST ARTHROSCOPY WITH DEBRIDEMENT Right 04/12/2017   Procedure: RIGHT WRIST ARTHROSCOPY WITH DEBRIDEMENT;  Surgeon: Cindee Salt, MD;  Location: [facility];  Service: Orthopedics;  Laterality: Right;   Current Outpatient Medications on File Prior to Visit  Medication Sig Dispense Refill   amLODipine (NORVASC) 5 MG tablet TAKE 1 TABLET BY MOUTH TWICE DAILY 180 tablet 2   Apple Cider Vinegar 600 MG CAPS Take by mouth.     aspirin 81 MG chewable tablet Chew 1 tablet (81 mg total) by mouth 2 (two) times daily. 35 tablet 0   atorvastatin (LIPITOR) 20 MG tablet TAKE 1 TABLET BY MOUTH EVERY DAY 90 tablet 2   Calcium Carb-Cholecalciferol (CALCIUM/VITAMIN D) 600-400 MG-UNIT TABS Take 2 tablets by mouth daily.     cetirizine (ZYRTEC) 10 MG tablet Take 10 mg by mouth daily.     levothyroxine (SYNTHROID) 100 MCG tablet Take 1 tablet (100 mcg total) by mouth daily. 90 tablet 3   losartan (COZAAR) 100 MG tablet TAKE 1 TABLET BY MOUTH DAILY 90 tablet 2   meloxicam (MOBIC) 15 MG tablet Take 15  mg by mouth daily.     vitamin C (ASCORBIC ACID) 500 MG tablet Take 500 mg by mouth daily.     zinc gluconate 50 MG tablet Take 50 mg by mouth  daily.     No current facility-administered medications on file prior to visit.   Marland Kitchenall Social History   Socioeconomic History   Marital status: Married    Spouse name: Not on file   Number of children: Not on file   Years of education: Not on file   Highest education level: Not on file  Occupational History   Not on file  Tobacco Use   Smoking status: Never   Smokeless tobacco: Never  Vaping Use   Vaping Use: Never used  Substance and Sexual Activity   Alcohol use: No   Drug use: No   Sexual activity: Yes  Other Topics Concern   Not on file  Social History Narrative   Not on file   Social Determinants of Health   Financial Resource Strain: Not on file  Food Insecurity: Not on file  Transportation Needs: Not on file  Physical Activity: Not on file  Stress: Not on file  Social Connections: Not on file  Intimate Partner Violence: Not on file     Review of Systems  All other systems reviewed and are negative.     Objective:   Physical Exam Constitutional:      Appearance: Normal appearance. She is normal weight.  Cardiovascular:     Rate and Rhythm: Normal rate and regular rhythm.     Pulses: Normal pulses.     Heart sounds: Normal heart sounds. No murmur heard.   No gallop.  Pulmonary:     Effort: Pulmonary effort is normal. No respiratory distress.     Breath sounds: Normal breath sounds. No stridor. No wheezing, rhonchi or rales.  Abdominal:     General: Bowel sounds are normal.     Palpations: Abdomen is soft.  Musculoskeletal:     Right lower leg: No edema.     Left lower leg: No edema.  Neurological:     General: No focal deficit present.     Mental Status: She is alert and oriented to person, place, and time.          Assessment & Plan:  Encounter for screening mammogram for malignant neoplasm of  breast - Plan: MM Digital Screening  Colon cancer screening - Plan: Ambulatory referral to Gastroenterology  General medical exam  Benign essential HTN  Hypothyroidism, unspecified type  Pure hypercholesterolemia  Osteopenia, unspecified location We will increase levothyroxine to 100 mcg a day and recheck TSH in 3 months.  The remainder of her lab work is outstanding.  She is up-to-date on all of her immunizations.  I will schedule her for colonoscopy as well as a mammogram.  We will defer a bone density test until next year.  She denies any depression falls or memory loss.  Recheck TSH in 3 months.  Regular anticipatory guidance is provided.

## 2021-07-02 DIAGNOSIS — Z1211 Encounter for screening for malignant neoplasm of colon: Secondary | ICD-10-CM | POA: Diagnosis not present

## 2021-07-02 DIAGNOSIS — Z86010 Personal history of colonic polyps: Secondary | ICD-10-CM | POA: Diagnosis not present

## 2021-07-15 ENCOUNTER — Other Ambulatory Visit: Payer: Self-pay | Admitting: Family Medicine

## 2021-07-15 DIAGNOSIS — Z1231 Encounter for screening mammogram for malignant neoplasm of breast: Secondary | ICD-10-CM

## 2021-08-21 ENCOUNTER — Ambulatory Visit
Admission: RE | Admit: 2021-08-21 | Discharge: 2021-08-21 | Disposition: A | Discharge status: Home / Self Care | Payer: Medicare PPO | Source: Ambulatory Visit | Attending: Family Medicine | Admitting: Family Medicine

## 2021-08-21 DIAGNOSIS — Z1231 Encounter for screening mammogram for malignant neoplasm of breast: Secondary | ICD-10-CM | POA: Diagnosis not present

## 2021-08-25 ENCOUNTER — Other Ambulatory Visit: Payer: Self-pay | Admitting: Family Medicine

## 2021-08-25 DIAGNOSIS — R928 Other abnormal and inconclusive findings on diagnostic imaging of breast: Secondary | ICD-10-CM

## 2021-09-03 ENCOUNTER — Ambulatory Visit
Admission: RE | Admit: 2021-09-03 | Discharge: 2021-09-03 | Disposition: A | Discharge status: Home / Self Care | Payer: Medicare PPO | Source: Ambulatory Visit | Attending: Family Medicine | Admitting: Family Medicine

## 2021-09-03 DIAGNOSIS — R928 Other abnormal and inconclusive findings on diagnostic imaging of breast: Secondary | ICD-10-CM

## 2021-09-03 DIAGNOSIS — N6012 Diffuse cystic mastopathy of left breast: Secondary | ICD-10-CM | POA: Diagnosis not present

## 2021-09-21 ENCOUNTER — Other Ambulatory Visit: Payer: Medicare PPO

## 2021-09-21 DIAGNOSIS — E039 Hypothyroidism, unspecified: Secondary | ICD-10-CM

## 2021-09-21 DIAGNOSIS — E02 Subclinical iodine-deficiency hypothyroidism: Secondary | ICD-10-CM

## 2021-09-22 LAB — TSH: TSH: 2.97 mIU/L (ref 0.40–4.50)

## 2021-11-06 DIAGNOSIS — M19071 Primary osteoarthritis, right ankle and foot: Secondary | ICD-10-CM | POA: Diagnosis not present

## 2021-11-06 DIAGNOSIS — M19072 Primary osteoarthritis, left ankle and foot: Secondary | ICD-10-CM | POA: Diagnosis not present

## 2021-12-09 ENCOUNTER — Other Ambulatory Visit: Payer: Self-pay | Admitting: Family Medicine

## 2021-12-10 NOTE — Telephone Encounter (Signed)
Requested medication (s) are due for refill today:   Provider to review  Requested medication (s) are on the active medication list:   Yes as historical from 02/20/2021  Future visit scheduled:   No   Had physical 06/23/2021    Last ordered: 02/20/2021 by a historical provider  Returned because last prescribed by a historical provider.   Requested Prescriptions  Pending Prescriptions Disp Refills   meloxicam (MOBIC) 15 MG tablet [Pharmacy Med Name: MELOXICAM 15MG TABLETS] 90 tablet     Sig: TAKE 1 TABLET BY MOUTH EVERY DAY AS NEEDED FOR JOINT PAIN     Analgesics:  COX2 Inhibitors Failed - 12/09/2021  4:26 PM      Failed - Manual Review: Labs are only required if the patient has taken medication for more than 8 weeks.      Failed - Valid encounter within last 12 months    Recent Outpatient Visits           5 months ago Encounter for screening mammogram for malignant neoplasm of breast   [place], Cammie Mcgee, MD   9 months ago Need for immunization against influenza   [place], Cammie Mcgee, MD              Passed - HGB in normal range and within 360 days    Hemoglobin  Date Value Ref Range Status  06/16/2021 12.5 11.7 - 15.5 g/dL Final         Passed - Cr in normal range and within 360 days    Creat  Date Value Ref Range Status  06/16/2021 0.72 0.60 - 1.00 mg/dL Final         Passed - HCT in normal range and within 360 days    HCT  Date Value Ref Range Status  06/16/2021 39.4 35.0 - 45.0 % Final         Passed - AST in normal range and within 360 days    AST  Date Value Ref Range Status  06/16/2021 17 10 - 35 U/L Final         Passed - ALT in normal range and within 360 days    ALT  Date Value Ref Range Status  06/16/2021 13 6 - 29 U/L Final         Passed - eGFR is 30 or above and within 360 days    GFR calc Af Amer  Date Value Ref Range Status  05/20/2018 >60 >60 mL/min Final   GFR calc non Af Amer   Date Value Ref Range Status  05/20/2018 >60 >60 mL/min Final   eGFR  Date Value Ref Range Status  02/24/2021 95 > OR = 60 mL/min/1.37m Final    Comment:    The eGFR is based on the CKD-EPI 2021 equation. To calculate  the new eGFR from a previous Creatinine or Cystatin C result, go to https://www.kidney.org/professionals/ kdoqi/gfr%5Fcalculator          Passed - Patient is not pregnant

## 2021-12-15 ENCOUNTER — Other Ambulatory Visit: Payer: Self-pay | Admitting: Family Medicine

## 2021-12-15 ENCOUNTER — Encounter: Payer: Self-pay | Admitting: Family Medicine

## 2021-12-15 DIAGNOSIS — E6609 Other obesity due to excess calories: Secondary | ICD-10-CM

## 2021-12-28 DIAGNOSIS — E039 Hypothyroidism, unspecified: Secondary | ICD-10-CM | POA: Diagnosis not present

## 2021-12-28 DIAGNOSIS — Z131 Encounter for screening for diabetes mellitus: Secondary | ICD-10-CM | POA: Diagnosis not present

## 2021-12-28 DIAGNOSIS — E782 Mixed hyperlipidemia: Secondary | ICD-10-CM | POA: Diagnosis not present

## 2021-12-28 DIAGNOSIS — F322 Major depressive disorder, single episode, severe without psychotic features: Secondary | ICD-10-CM | POA: Diagnosis not present

## 2021-12-28 DIAGNOSIS — Z1331 Encounter for screening for depression: Secondary | ICD-10-CM | POA: Diagnosis not present

## 2021-12-28 DIAGNOSIS — M150 Primary generalized (osteo)arthritis: Secondary | ICD-10-CM | POA: Diagnosis not present

## 2021-12-28 DIAGNOSIS — I10 Essential (primary) hypertension: Secondary | ICD-10-CM | POA: Diagnosis not present

## 2021-12-28 DIAGNOSIS — R7309 Other abnormal glucose: Secondary | ICD-10-CM | POA: Diagnosis not present

## 2021-12-28 DIAGNOSIS — G4733 Obstructive sleep apnea (adult) (pediatric): Secondary | ICD-10-CM | POA: Diagnosis not present

## 2021-12-30 DIAGNOSIS — Z131 Encounter for screening for diabetes mellitus: Secondary | ICD-10-CM | POA: Diagnosis not present

## 2021-12-30 DIAGNOSIS — E039 Hypothyroidism, unspecified: Secondary | ICD-10-CM | POA: Diagnosis not present

## 2021-12-30 DIAGNOSIS — Z8639 Personal history of other endocrine, nutritional and metabolic disease: Secondary | ICD-10-CM | POA: Diagnosis not present

## 2021-12-30 DIAGNOSIS — R7309 Other abnormal glucose: Secondary | ICD-10-CM | POA: Diagnosis not present

## 2021-12-30 DIAGNOSIS — Z6832 Body mass index (BMI) 32.0-32.9, adult: Secondary | ICD-10-CM | POA: Diagnosis not present

## 2022-01-13 DIAGNOSIS — I10 Essential (primary) hypertension: Secondary | ICD-10-CM | POA: Diagnosis not present

## 2022-01-13 DIAGNOSIS — E782 Mixed hyperlipidemia: Secondary | ICD-10-CM | POA: Diagnosis not present

## 2022-01-13 DIAGNOSIS — E669 Obesity, unspecified: Secondary | ICD-10-CM | POA: Diagnosis not present

## 2022-01-13 DIAGNOSIS — R7303 Prediabetes: Secondary | ICD-10-CM | POA: Diagnosis not present

## 2022-01-13 DIAGNOSIS — E039 Hypothyroidism, unspecified: Secondary | ICD-10-CM | POA: Diagnosis not present

## 2022-01-13 DIAGNOSIS — M8580 Other specified disorders of bone density and structure, unspecified site: Secondary | ICD-10-CM | POA: Diagnosis not present

## 2022-01-13 DIAGNOSIS — F322 Major depressive disorder, single episode, severe without psychotic features: Secondary | ICD-10-CM | POA: Diagnosis not present

## 2022-01-13 DIAGNOSIS — G4733 Obstructive sleep apnea (adult) (pediatric): Secondary | ICD-10-CM | POA: Diagnosis not present

## 2022-01-13 DIAGNOSIS — M150 Primary generalized (osteo)arthritis: Secondary | ICD-10-CM | POA: Diagnosis not present

## 2022-01-25 DIAGNOSIS — M19072 Primary osteoarthritis, left ankle and foot: Secondary | ICD-10-CM | POA: Diagnosis not present

## 2022-02-11 DIAGNOSIS — Z72820 Sleep deprivation: Secondary | ICD-10-CM | POA: Diagnosis not present

## 2022-02-11 DIAGNOSIS — I10 Essential (primary) hypertension: Secondary | ICD-10-CM | POA: Diagnosis not present

## 2022-02-11 DIAGNOSIS — G4733 Obstructive sleep apnea (adult) (pediatric): Secondary | ICD-10-CM | POA: Diagnosis not present

## 2022-02-11 DIAGNOSIS — R7303 Prediabetes: Secondary | ICD-10-CM | POA: Diagnosis not present

## 2022-02-11 DIAGNOSIS — E669 Obesity, unspecified: Secondary | ICD-10-CM | POA: Diagnosis not present

## 2022-02-25 ENCOUNTER — Other Ambulatory Visit: Payer: Self-pay | Admitting: Family Medicine

## 2022-02-25 DIAGNOSIS — G4733 Obstructive sleep apnea (adult) (pediatric): Secondary | ICD-10-CM | POA: Diagnosis not present

## 2022-02-25 DIAGNOSIS — R7303 Prediabetes: Secondary | ICD-10-CM | POA: Diagnosis not present

## 2022-02-25 DIAGNOSIS — Z6832 Body mass index (BMI) 32.0-32.9, adult: Secondary | ICD-10-CM | POA: Diagnosis not present

## 2022-02-25 DIAGNOSIS — I10 Essential (primary) hypertension: Secondary | ICD-10-CM | POA: Diagnosis not present

## 2022-02-25 DIAGNOSIS — M79672 Pain in left foot: Secondary | ICD-10-CM | POA: Diagnosis not present

## 2022-02-25 DIAGNOSIS — Z72820 Sleep deprivation: Secondary | ICD-10-CM | POA: Diagnosis not present

## 2022-02-25 DIAGNOSIS — E669 Obesity, unspecified: Secondary | ICD-10-CM | POA: Diagnosis not present

## 2022-02-25 DIAGNOSIS — E65 Localized adiposity: Secondary | ICD-10-CM | POA: Diagnosis not present

## 2022-02-25 NOTE — Telephone Encounter (Signed)
Appointment 06/23/22 Requested Prescriptions  Pending Prescriptions Disp Refills  . atorvastatin (LIPITOR) 20 MG tablet [Pharmacy Med Name: ATORVASTATIN 20MG  TABLETS] 90 tablet 0    Sig: TAKE 1 TABLET BY MOUTH EVERY DAY     Cardiovascular:  Antilipid - Statins Failed - 02/25/2022  8:46 AM      Failed - Valid encounter within last 12 months    Recent Outpatient Visits          8 months ago Encounter for screening mammogram for malignant neoplasm of breast   [place], Cammie Mcgee, MD   1 year ago Need for immunization against influenza   [place] Susy Frizzle, MD             Failed - Lipid Panel in normal range within the last 12 months    Cholesterol  Date Value Ref Range Status  06/16/2021 180 <200 mg/dL Final   LDL Cholesterol (Calc)  Date Value Ref Range Status  06/16/2021 92 mg/dL (calc) Final    Comment:    Reference range: <100 . Desirable range <100 mg/dL for primary prevention;   <70 mg/dL for patients with CHD or diabetic patients  with > or = 2 CHD risk factors. Marland Kitchen LDL-C is now calculated using the Martin-Hopkins  calculation, which is a validated novel method providing  better accuracy than the Friedewald equation in the  estimation of LDL-C.  Cresenciano Genre et al. Annamaria Helling. 4235;361(44): 2061-2068  (http://education.[facility].com/faq/FAQ164)    HDL  Date Value Ref Range Status  06/16/2021 73 > OR = 50 mg/dL Final   Triglycerides  Date Value Ref Range Status  06/16/2021 64 <150 mg/dL Final         Passed - Patient is not pregnant      . amLODipine (NORVASC) 5 MG tablet [Pharmacy Med Name: AMLODIPINE BESYLATE 5MG  TABLETS] 180 tablet 0    Sig: TAKE 1 TABLET BY MOUTH TWICE DAILY     Cardiovascular: Calcium Channel Blockers 2 Failed - 02/25/2022  8:46 AM      Failed - Valid encounter within last 6 months    Recent Outpatient Visits          8 months ago Encounter for screening mammogram for malignant  neoplasm of breast   [place] Susy Frizzle, MD   1 year ago Need for immunization against influenza   [place], Cammie Mcgee, MD             Passed - Last BP in normal range    BP Readings from Last 1 Encounters:  06/23/21 138/72         Passed - Last Heart Rate in normal range    Pulse Readings from Last 1 Encounters:  06/23/21 72         . losartan (COZAAR) 100 MG tablet [Pharmacy Med Name: LOSARTAN 100MG  TABLETS] 90 tablet 0    Sig: TAKE 1 TABLET BY MOUTH DAILY     Cardiovascular:  Angiotensin Receptor Blockers Failed - 02/25/2022  8:46 AM      Failed - Cr in normal range and within 180 days    Creat  Date Value Ref Range Status  06/16/2021 0.72 0.60 - 1.00 mg/dL Final         Failed - K in normal range and within 180 days    Potassium  Date Value Ref Range Status  06/16/2021 4.7 3.5 - 5.3 mmol/L Final  Failed - Valid encounter within last 6 months    Recent Outpatient Visits          8 months ago Encounter for screening mammogram for malignant neoplasm of breast   [place] Pick[place], Cammie Mcgee, MD   1 year ago Need for immunization against influenza   [place], Cammie Mcgee, MD             Passed - Patient is not pregnant      Passed - Last BP in normal range    BP Readings from Last 1 Encounters:  06/23/21 138/72

## 2022-03-05 ENCOUNTER — Ambulatory Visit (INDEPENDENT_AMBULATORY_CARE_PROVIDER_SITE_OTHER): Payer: Medicare PPO

## 2022-03-05 DIAGNOSIS — Z23 Encounter for immunization: Secondary | ICD-10-CM | POA: Diagnosis not present

## 2022-03-09 DIAGNOSIS — L812 Freckles: Secondary | ICD-10-CM | POA: Diagnosis not present

## 2022-03-09 DIAGNOSIS — D225 Melanocytic nevi of trunk: Secondary | ICD-10-CM | POA: Diagnosis not present

## 2022-03-09 DIAGNOSIS — L821 Other seborrheic keratosis: Secondary | ICD-10-CM | POA: Diagnosis not present

## 2022-03-09 DIAGNOSIS — L720 Epidermal cyst: Secondary | ICD-10-CM | POA: Diagnosis not present

## 2022-04-19 DIAGNOSIS — E669 Obesity, unspecified: Secondary | ICD-10-CM | POA: Diagnosis not present

## 2022-04-19 DIAGNOSIS — E039 Hypothyroidism, unspecified: Secondary | ICD-10-CM | POA: Diagnosis not present

## 2022-04-19 DIAGNOSIS — I10 Essential (primary) hypertension: Secondary | ICD-10-CM | POA: Diagnosis not present

## 2022-04-19 DIAGNOSIS — G4733 Obstructive sleep apnea (adult) (pediatric): Secondary | ICD-10-CM | POA: Diagnosis not present

## 2022-04-21 DIAGNOSIS — E65 Localized adiposity: Secondary | ICD-10-CM | POA: Diagnosis not present

## 2022-04-21 DIAGNOSIS — I10 Essential (primary) hypertension: Secondary | ICD-10-CM | POA: Diagnosis not present

## 2022-04-21 DIAGNOSIS — G4733 Obstructive sleep apnea (adult) (pediatric): Secondary | ICD-10-CM | POA: Diagnosis not present

## 2022-04-21 DIAGNOSIS — R7303 Prediabetes: Secondary | ICD-10-CM | POA: Diagnosis not present

## 2022-04-21 DIAGNOSIS — Z6830 Body mass index (BMI) 30.0-30.9, adult: Secondary | ICD-10-CM | POA: Diagnosis not present

## 2022-04-21 DIAGNOSIS — E669 Obesity, unspecified: Secondary | ICD-10-CM | POA: Diagnosis not present

## 2022-04-27 ENCOUNTER — Ambulatory Visit: Payer: Medicare PPO | Admitting: Family Medicine

## 2022-04-27 ENCOUNTER — Other Ambulatory Visit: Payer: Self-pay | Admitting: Family Medicine

## 2022-04-27 VITALS — BP 132/76 | HR 75 | Ht 62.5 in | Wt 175.0 lb

## 2022-04-27 DIAGNOSIS — M545 Low back pain, unspecified: Secondary | ICD-10-CM | POA: Diagnosis not present

## 2022-04-27 LAB — URINALYSIS, ROUTINE W REFLEX MICROSCOPIC
Bacteria, UA: NONE SEEN /HPF
Bilirubin Urine: NEGATIVE
Glucose, UA: NEGATIVE
Hgb urine dipstick: NEGATIVE
Hyaline Cast: NONE SEEN /LPF
Ketones, ur: NEGATIVE
Nitrite: NEGATIVE
Specific Gravity, Urine: 1.02 (ref 1.001–1.035)
pH: 7 (ref 5.0–8.0)

## 2022-04-27 LAB — MICROSCOPIC MESSAGE

## 2022-04-27 MED ORDER — TAMSULOSIN HCL 0.4 MG PO CAPS: 0.4000 mg | ORAL_CAPSULE | Freq: Every day | ORAL | 0 refills | Status: DC

## 2022-04-27 MED ORDER — TRAMADOL HCL 50 MG PO TABS: 100.0000 mg | ORAL_TABLET | Freq: Four times a day (QID) | ORAL | 0 refills | Status: AC | PRN

## 2022-04-27 NOTE — Progress Notes (Signed)
Subjective:    Patient ID: Jean Baldwin, female    DOB: 06/18/1950, 71 y.o.   MRN: [id number]  HPI  Patient has a history of kidney stones.  Yesterday she suddenly developed pain in her right flank just above her right gluteus.  It was a sharp intense pain similar to what she is experienced with kidney stones.  She became nauseated and threw up for approximately 30 minutes.  She had intermittent colicky pain in the right flank throughout the rest of the day.  She denies any dysuria or urgency or frequency.  She has trace leukocyte Estrace, negative nitrates, negative blood on urinalysis today.  Today she states that she is completely asymptomatic.  Her exam today is completely normal.  She has no CVA tenderness   Past Medical History:  Diagnosis Date   Arthritis    knees   Dental crowns present    High cholesterol    Hypertension    states under control with meds., has been on med > 20 yr.   Hypothyroidism    Osteopenia    TFCC (triangular fibrocartilage complex) tear 03/2017   right wrist/wrist   Past Surgical History:  Procedure Laterality Date   KNEE ARTHROSCOPY W/ MENISCAL REPAIR Left    NASAL SEPTUM SURGERY     TOTAL HIP ARTHROPLASTY Right 07/12/2017   Procedure: RIGHT TOTAL HIP ARTHROPLASTY ANTERIOR APPROACH;  Surgeon: Kathryne Hitch, MD;  Location: [facility];  Service: Orthopedics;  Laterality: Right;   TUBAL LIGATION     WRIST ARTHROSCOPY WITH DEBRIDEMENT Right 04/12/2017   Procedure: RIGHT WRIST ARTHROSCOPY WITH DEBRIDEMENT;  Surgeon: Cindee Salt, MD;  [facility];  Service: Orthopedics;  Laterality: Right;   Current Outpatient Medications on File Prior to Visit  Medication Sig Dispense Refill   amLODipine (NORVASC) 5 MG tablet TAKE 1 TABLET BY MOUTH TWICE DAILY 180 tablet 0   Apple Cider Vinegar 600 MG CAPS Take by mouth.     aspirin 81 MG chewable tablet Chew 1 tablet (81 mg total) by mouth 2 (two) times daily. 35 tablet 0   atorvastatin  (LIPITOR) 20 MG tablet TAKE 1 TABLET BY MOUTH EVERY DAY 90 tablet 0   Calcium Carb-Cholecalciferol (CALCIUM/VITAMIN D) 600-400 MG-UNIT TABS Take 2 tablets by mouth daily.     cetirizine (ZYRTEC) 10 MG tablet Take 10 mg by mouth daily.     levothyroxine (SYNTHROID) 100 MCG tablet Take 1 tablet (100 mcg total) by mouth daily. 90 tablet 3   losartan (COZAAR) 100 MG tablet TAKE 1 TABLET BY MOUTH DAILY 90 tablet 0   meloxicam (MOBIC) 15 MG tablet TAKE 1 TABLET BY MOUTH EVERY DAY AS NEEDED FOR JOINT PAIN 90 tablet 3   vitamin C (ASCORBIC ACID) 500 MG tablet Take 500 mg by mouth daily.     zinc gluconate 50 MG tablet Take 50 mg by mouth daily.     No current facility-administered medications on file prior to visit.   Marland Kitchenall Social History   Socioeconomic History   Marital status: Married    Spouse name: Not on file   Number of children: Not on file   Years of education: Not on file   Highest education level: Not on file  Occupational History   Not on file  Tobacco Use   Smoking status: Never   Smokeless tobacco: Never  Vaping Use   Vaping Use: Never used  Substance and Sexual Activity   Alcohol use: No   Drug  use: No   Sexual activity: Yes  Other Topics Concern   Not on file  Social History Narrative   Not on file   Social Determinants of Health   Financial Resource Strain: Not on file  Food Insecurity: Not on file  Transportation Needs: Not on file  Physical Activity: Not on file  Stress: Not on file  Social Connections: Not on file  Intimate Partner Violence: Not on file     Review of Systems  All other systems reviewed and are negative.      Objective:   Physical Exam Constitutional:      Appearance: Normal appearance. She is normal weight.  Cardiovascular:     Rate and Rhythm: Normal rate and regular rhythm.     Pulses: Normal pulses.     Heart sounds: Normal heart sounds. No murmur heard.    No gallop.  Pulmonary:     Effort: Pulmonary effort is normal. No  respiratory distress.     Breath sounds: Normal breath sounds. No stridor. No wheezing, rhonchi or rales.  Abdominal:     General: Bowel sounds are normal.     Palpations: Abdomen is soft.  Musculoskeletal:     Right lower leg: No edema.     Left lower leg: No edema.  Neurological:     General: No focal deficit present.     Mental Status: She is alert and oriented to person, place, and time.           Assessment & Plan:  Low back pain, unspecified back pain laterality, unspecified chronicity, unspecified whether sciatica present - Plan: Urinalysis, Routine w reflex microscopic I suspect that she is trying to pass a kidney stone.  Begin Flomax 0.4 mg p.o. nightly for the next 7 to 10 days until she passes the stone return to work convinced that no other symptoms are going to recur.  She can use tramadol 1 to 2 tablets every 6 hours as needed for pain.  If the pain worsens, I would like to proceed with a CT scan to determine the size of the kidney stone and to confirm the diagnosis.

## 2022-04-30 ENCOUNTER — Emergency Department (HOSPITAL_BASED_OUTPATIENT_CLINIC_OR_DEPARTMENT_OTHER)
Admission: EM | Admit: 2022-04-30 | Discharge: 2022-04-30 | Disposition: A | Discharge status: Home / Self Care | Payer: Medicare PPO | Attending: Emergency Medicine | Admitting: Emergency Medicine

## 2022-04-30 ENCOUNTER — Other Ambulatory Visit: Payer: Self-pay

## 2022-04-30 ENCOUNTER — Encounter (HOSPITAL_BASED_OUTPATIENT_CLINIC_OR_DEPARTMENT_OTHER): Payer: Self-pay | Admitting: Emergency Medicine

## 2022-04-30 ENCOUNTER — Emergency Department (HOSPITAL_BASED_OUTPATIENT_CLINIC_OR_DEPARTMENT_OTHER): Payer: Medicare PPO

## 2022-04-30 DIAGNOSIS — N132 Hydronephrosis with renal and ureteral calculous obstruction: Secondary | ICD-10-CM | POA: Diagnosis not present

## 2022-04-30 DIAGNOSIS — I10 Essential (primary) hypertension: Secondary | ICD-10-CM | POA: Diagnosis not present

## 2022-04-30 DIAGNOSIS — N201 Calculus of ureter: Secondary | ICD-10-CM

## 2022-04-30 DIAGNOSIS — Z7982 Long term (current) use of aspirin: Secondary | ICD-10-CM | POA: Diagnosis not present

## 2022-04-30 DIAGNOSIS — R109 Unspecified abdominal pain: Secondary | ICD-10-CM | POA: Diagnosis present

## 2022-04-30 DIAGNOSIS — K5730 Diverticulosis of large intestine without perforation or abscess without bleeding: Secondary | ICD-10-CM | POA: Diagnosis not present

## 2022-04-30 DIAGNOSIS — Z79899 Other long term (current) drug therapy: Secondary | ICD-10-CM | POA: Insufficient documentation

## 2022-04-30 DIAGNOSIS — E039 Hypothyroidism, unspecified: Secondary | ICD-10-CM | POA: Diagnosis not present

## 2022-04-30 DIAGNOSIS — N1330 Unspecified hydronephrosis: Secondary | ICD-10-CM | POA: Diagnosis not present

## 2022-04-30 LAB — CBC
HCT: 36.3 % (ref 36.0–46.0)
Hemoglobin: 11.9 g/dL — ABNORMAL LOW (ref 12.0–15.0)
MCH: 27.5 pg (ref 26.0–34.0)
MCHC: 32.8 g/dL (ref 30.0–36.0)
MCV: 83.8 fL (ref 80.0–100.0)
Platelets: 259 10*3/uL (ref 150–400)
RBC: 4.33 MIL/uL (ref 3.87–5.11)
RDW: 14.5 % (ref 11.5–15.5)
WBC: 11.9 10*3/uL — ABNORMAL HIGH (ref 4.0–10.5)
nRBC: 0 % (ref 0.0–0.2)

## 2022-04-30 LAB — URINALYSIS, ROUTINE W REFLEX MICROSCOPIC
Bacteria, UA: NONE SEEN
Bilirubin Urine: NEGATIVE
Glucose, UA: NEGATIVE mg/dL
Hgb urine dipstick: NEGATIVE
Ketones, ur: NEGATIVE mg/dL
Nitrite: NEGATIVE
Specific Gravity, Urine: 1.023 (ref 1.005–1.030)
pH: 6 (ref 5.0–8.0)

## 2022-04-30 LAB — BASIC METABOLIC PANEL
Anion gap: 10 (ref 5–15)
BUN: 17 mg/dL (ref 8–23)
CO2: 24 mmol/L (ref 22–32)
Calcium: 9.3 mg/dL (ref 8.9–10.3)
Chloride: 100 mmol/L (ref 98–111)
Creatinine, Ser: 0.74 mg/dL (ref 0.44–1.00)
GFR, Estimated: >60 mL/min (ref >60)
Glucose, Bld: 135 mg/dL — ABNORMAL HIGH (ref 70–99)
Potassium: 3.4 mmol/L — ABNORMAL LOW (ref 3.5–5.1)
Sodium: 134 mmol/L — ABNORMAL LOW (ref 135–145)

## 2022-04-30 MED ORDER — ONDANSETRON HCL 4 MG/2ML IJ SOLN
4.0000 mg | Freq: Once | INTRAMUSCULAR | Status: AC
  Administered 2022-04-30: 4 mg via INTRAVENOUS
  Filled 2022-04-30: qty 2

## 2022-04-30 MED ORDER — KETOROLAC TROMETHAMINE 15 MG/ML IJ SOLN
15.0000 mg | Freq: Once | INTRAMUSCULAR | Status: AC
  Administered 2022-04-30: 15 mg via INTRAVENOUS
  Filled 2022-04-30: qty 1

## 2022-04-30 MED ORDER — METOCLOPRAMIDE HCL 10 MG PO TABS: 10.0000 mg | ORAL_TABLET | Freq: Four times a day (QID) | ORAL | 0 refills | Status: DC

## 2022-04-30 MED ORDER — ACETAMINOPHEN 500 MG PO TABS
1000.0000 mg | ORAL_TABLET | Freq: Once | ORAL | Status: AC
  Administered 2022-04-30: 1000 mg via ORAL
  Filled 2022-04-30: qty 2

## 2022-04-30 MED ORDER — METOCLOPRAMIDE HCL 5 MG/ML IJ SOLN
10.0000 mg | Freq: Once | INTRAMUSCULAR | Status: AC
  Administered 2022-04-30: 10 mg via INTRAVENOUS
  Filled 2022-04-30: qty 2

## 2022-04-30 MED ORDER — MORPHINE SULFATE (PF) 4 MG/ML IV SOLN
4.0000 mg | Freq: Once | INTRAVENOUS | Status: AC
  Administered 2022-04-30: 4 mg via INTRAVENOUS
  Filled 2022-04-30: qty 1

## 2022-04-30 NOTE — Discharge Instructions (Signed)
You were seen in the emergency department for your flank pain.  You did have signs that you have a small kidney stone.  You have no signs of kidney infection.  You can continue to take Tylenol, tramadol and ibuprofen as needed for pain and I have given you a nausea medication that you can take as needed.  Should make sure that you are drinking plenty of fluids.  You can follow-up with your primary doctor and urology for further management of your kidney stone.  You should return to the emergency department for significantly worsening pain, repetitive vomiting, fevers or if you have any other new or concerning symptoms.

## 2022-04-30 NOTE — ED Triage Notes (Signed)
Pt arrives to ED with c/o right sided flank pain. The pain started x5 days ago but worsened today.

## 2022-04-30 NOTE — ED Provider Notes (Signed)
[facility] DEPT Provider Note   CSN: [id number] Arrival date & time: 04/30/22  1752     History  Chief Complaint  Patient presents with   Flank Pain    Jean Baldwin is a 71 y.o. female.  Patient is a 71 year old female with a past medical history of nephrolithiasis, hypertension and hypothyroidism presenting to the emergency department with right-sided flank pain.  The patient states that on Monday she had severe right-sided flank pain concerning for possible kidney stone with associated nausea and vomiting.  She states that she was seen by her primary doctor and was started on tramadol and Flomax.  She states that her symptoms initially improved and today the pain returned and was significantly worse.  She denies any fevers or chills, dysuria or hematuria.  She states that she has required lithotripsy for stones in the past.  The history is provided by the patient and a relative.  Flank Pain       Home Medications Prior to Admission medications   Medication Sig Start Date End Date Taking? Authorizing Provider  metoCLOPramide (REGLAN) 10 MG tablet Take 1 tablet (10 mg total) by mouth every 6 (six) hours. 04/30/22  Yes Maylon Peppers, [place] K, DO  amLODipine (NORVASC) 5 MG tablet TAKE 1 TABLET BY MOUTH TWICE DAILY 02/25/22   Susy Frizzle, MD  Apple Cider Vinegar 600 MG CAPS Take by mouth.    [provider]  aspirin 81 MG chewable tablet Chew 1 tablet (81 mg total) by mouth 2 (two) times daily. 07/14/17   Pete Pelt, PA-C  atorvastatin (LIPITOR) 20 MG tablet TAKE 1 TABLET BY MOUTH EVERY DAY 02/25/22   Susy Frizzle, MD  Calcium Carb-Cholecalciferol (CALCIUM/VITAMIN D) 600-400 MG-UNIT TABS Take 2 tablets by mouth daily.    [provider]  cetirizine (ZYRTEC) 10 MG tablet Take 10 mg by mouth daily.    [provider]  levothyroxine (SYNTHROID) 100 MCG tablet Take 1 tablet (100 mcg total) by mouth daily. 06/18/21    Susy Frizzle, MD  losartan (COZAAR) 100 MG tablet TAKE 1 TABLET BY MOUTH DAILY 02/25/22   Susy Frizzle, MD  meloxicam (MOBIC) 15 MG tablet TAKE 1 TABLET BY MOUTH EVERY DAY AS NEEDED FOR JOINT PAIN 12/11/21   Susy Frizzle, MD  tamsulosin (FLOMAX) 0.4 MG CAPS capsule TAKE 1 CAPSULE(0.4 MG) BY MOUTH DAILY 04/27/22   Susy Frizzle, MD  traMADol (ULTRAM) 50 MG tablet Take 2 tablets (100 mg total) by mouth every 6 (six) hours as needed for up to 5 days. 04/27/22 05/02/22  Susy Frizzle, MD  vitamin C (ASCORBIC ACID) 500 MG tablet Take 500 mg by mouth daily.    [provider]  zinc gluconate 50 MG tablet Take 50 mg by mouth daily.    [provider]      Allergies    Sulfamethoxazole, Codeine, and Sulfa antibiotics    Review of Systems   Review of Systems  Genitourinary:  Positive for flank pain.    Physical Exam Updated Vital Signs BP (!) 123/53   Pulse 63   Temp 97.6 F (36.4 C) (Oral)   Resp 18   SpO2 98%  Physical Exam Vitals and nursing note reviewed.  Constitutional:      General: She is not in acute distress.    Appearance: Normal appearance.  HENT:     Head: Normocephalic and atraumatic.     Nose: Nose normal.  Mouth/Throat:     Mouth: Mucous membranes are moist.     Pharynx: Oropharynx is clear.  Eyes:     Extraocular Movements: Extraocular movements intact.     Conjunctiva/sclera: Conjunctivae normal.  Cardiovascular:     Rate and Rhythm: Normal rate and regular rhythm.     Heart sounds: Normal heart sounds.  Pulmonary:     Effort: Pulmonary effort is normal.     Breath sounds: Normal breath sounds.  Abdominal:     General: Abdomen is flat.     Palpations: Abdomen is soft.     Tenderness: There is abdominal tenderness (Right lower quadrant). There is no right CVA tenderness, left CVA tenderness, guarding or rebound.  Musculoskeletal:        General: Normal range of motion.     Cervical back: Normal range of motion and  neck supple.  Skin:    General: Skin is warm and dry.  Neurological:     General: No focal deficit present.     Mental Status: She is alert and oriented to person, place, and time.  Psychiatric:        Mood and Affect: Mood normal.        Behavior: Behavior normal.     ED Results / Procedures / Treatments   Labs (all labs ordered are listed, but only abnormal results are displayed) Labs Reviewed  URINALYSIS, ROUTINE W REFLEX MICROSCOPIC - Abnormal; Notable for the following components:      Result Value   Protein, ur TRACE (*)    Leukocytes,Ua SMALL (*)    All other components within normal limits  BASIC METABOLIC PANEL - Abnormal; Notable for the following components:   Sodium 134 (*)    Potassium 3.4 (*)    Glucose, Bld 135 (*)    All other components within normal limits  CBC - Abnormal; Notable for the following components:   WBC 11.9 (*)    Hemoglobin 11.9 (*)    All other components within normal limits    EKG None  Radiology CT Renal Stone Study  Result Date: 04/30/2022 CLINICAL DATA:  Flank pain. EXAM: CT ABDOMEN AND PELVIS WITHOUT CONTRAST TECHNIQUE: Multidetector CT imaging of the abdomen and pelvis was performed following the standard protocol without IV contrast. RADIATION DOSE REDUCTION: This exam was performed according to the departmental dose-optimization program which includes automated exposure control, adjustment of the mA and/or kV according to patient size and/or use of iterative reconstruction technique. COMPARISON:  CT abdomen pelvis dated 05/20/2018. FINDINGS: Evaluation of this exam is limited in the absence of intravenous contrast. Lower chest: Minimal bibasilar linear atelectasis/scarring. The visualized lung bases are otherwise clear. No intra-abdominal free air or free fluid. Hepatobiliary: A 2.7 cm cyst in the left lobe of the liver anteriorly. No biliary ductal dilatation. The gallbladder is unremarkable. Pancreas: Unremarkable. No pancreatic  ductal dilatation or surrounding inflammatory changes. Spleen: Normal in size without focal abnormality. Adrenals/Urinary Tract: The adrenal glands are unremarkable. There is mild right hydronephrosis. No stone identified in the right kidney or visualized proximal and mid right ureter. The distal right ureter is poorly visualized due to streak artifact caused by right hip arthroplasty. Findings may be related to a recently passed stone or possibly a stone in the distal right ureter. The left kidney, visualized left ureter appear unremarkable. The urinary bladder is collapsed. Stomach/Bowel: Several small distal colonic diverticula without active inflammatory changes. There is no bowel obstruction or active inflammation. The appendix is not visualized with  certainty. No inflammatory changes identified in the right lower quadrant. Vascular/Lymphatic: Moderate aortoiliac atherosclerotic disease. The IVC is unremarkable. No portal venous gas. There is no adenopathy. Reproductive: Hysterectomy. The right ovary is not visualized. A 3.2 cm left ovary and or paraovarian cyst. Recommend follow-up US in 6-12 months. Note: This recommendation does not apply to premenarchal patients and to those with increased risk (genetic, family history, elevated tumor markers or other high-risk factors) of ovarian cancer. Reference: JACR 2020 Feb; 17(2):248-254 Other: None Musculoskeletal: Total right hip arthroplasty. Degenerative changes of the spine. No acute osseous pathology. IMPRESSION: 1. Mild right hydronephrosis may be related to a recently passed stone or possibly a stone in the distal right ureter. 2. Colonic diverticulosis. No bowel obstruction. 3. A 3.2 cm left ovary and or paraovarian cyst. Recommend follow-up US in 6-12 months. 4.  Aortic Atherosclerosis (ICD10-I70.0). Electronically Signed   By: Anner Crete M.D.   On: 04/30/2022 19:40    Procedures Procedures    Medications Ordered in ED Medications  ketorolac  (TORADOL) 15 MG/ML injection 15 mg (15 mg Intravenous Given 04/30/22 2016)  acetaminophen (TYLENOL) tablet 1,000 mg (1,000 mg Oral Given 04/30/22 2016)  ondansetron (ZOFRAN) injection 4 mg (4 mg Intravenous Given 04/30/22 2016)  morphine (PF) 4 MG/ML injection 4 mg (4 mg Intravenous Given 04/30/22 2103)  metoCLOPramide (REGLAN) injection 10 mg (10 mg Intravenous Given 04/30/22 2103)    ED Course/ Medical Decision Making/ A&P                           Medical Decision Making This patient presents to the ED with chief complaint(s) of flank pain with pertinent past medical history of nephrolithiasis, HTN, hypothyroidism which further complicates the presenting complaint. The complaint involves an extensive differential diagnosis and also carries with it a high risk of complications and morbidity.    The differential diagnosis includes nephrolithiasis, pyelonephritis, UTI, appendicitis, other intra-abdominal infection  Additional history obtained: Additional history obtained from family Records reviewed Primary Care Documents  ED Course and Reassessment: Patient was initially evaluated by triage and had labs, urine and CT stone search performed.  Labs are within normal range and urine was negative for infection.  Her CT showed evidence of mild right-sided hydro with possible distal ureter stone versus recently passed stone.  Patient is continuing to have pain so suspect distal ureter stone.  She was initially given Zofran and Toradol with only mild pain improvement and continued nausea and was given morphine and Reglan.  After second dose of pain and nausea control, she was able to tolerate p.o. and feels comfortable for discharge home.  She was prescribed Flomax and tramadol by her primary doctor and I will give her additional Reglan.  She will be given urology follow-up and was given strict return precautions  Independent labs interpretation:  The following labs were independently interpreted:  Within normal range  Independent visualization of imaging: - I independently visualized the following imaging with scope of interpretation limited to determining acute life threatening conditions related to emergency care: CT stone search, which revealed right-sided mild hydro with likely distal ureter stone  Consultation: - Consulted or discussed management/test interpretation w/ external professional: N/A  Consideration for admission or further workup: Patient has no emergent conditions requiring admission or further work-up at this time and is stable for discharge home with primary care and urology follow-up  Social Determinants of health: N/A    Amount and/or Complexity of  Data Reviewed Labs: ordered. Radiology: ordered.  Risk OTC drugs. Prescription drug management.          Final Clinical Impression(s) / ED Diagnoses Final diagnoses:  Ureterolithiasis    Rx / DC Orders ED Discharge Orders          Ordered    metoCLOPramide (REGLAN) 10 MG tablet  Every 6 hours        04/30/22 2151              Rexford Maus, DO 04/30/22 2151

## 2022-05-15 DIAGNOSIS — G4733 Obstructive sleep apnea (adult) (pediatric): Secondary | ICD-10-CM | POA: Diagnosis not present

## 2022-05-17 ENCOUNTER — Other Ambulatory Visit: Payer: Self-pay | Admitting: Family Medicine

## 2022-05-18 NOTE — Telephone Encounter (Signed)
Requested Prescriptions  Pending Prescriptions Disp Refills   levothyroxine (SYNTHROID) 100 MCG tablet [Pharmacy Med Name: LEVOTHYROXINE 0.100MG  (100MCG) TAB] 90 tablet 1    Sig: TAKE 1 TABLET(100 MCG) BY MOUTH DAILY     Endocrinology:  Hypothyroid Agents Failed - 05/17/2022  6:15 AM      Failed - Valid encounter within last 12 months    Recent Outpatient Visits           10 months ago Encounter for screening mammogram for malignant neoplasm of breast   [place], Cammie Mcgee, MD   1 year ago Need for immunization against influenza   [place], Cammie Mcgee, MD              Passed - TSH in normal range and within 360 days    TSH  Date Value Ref Range Status  09/21/2021 2.97 0.40 - 4.50 mIU/L Final          amLODipine (NORVASC) 5 MG tablet [Pharmacy Med Name: AMLODIPINE BESYLATE 5MG  TABLETS] 180 tablet 1    Sig: TAKE 1 TABLET BY MOUTH TWICE DAILY     Cardiovascular: Calcium Channel Blockers 2 Failed - 05/17/2022  6:15 AM      Failed - Valid encounter within last 6 months    Recent Outpatient Visits           10 months ago Encounter for screening mammogram for malignant neoplasm of breast   [place] [facility], Cammie Mcgee, MD   1 year ago Need for immunization against influenza   [place], Cammie Mcgee, MD              Passed - Last BP in normal range    BP Readings from Last 1 Encounters:  04/30/22 (!) 123/53         Passed - Last Heart Rate in normal range    Pulse Readings from Last 1 Encounters:  04/30/22 63          atorvastatin (LIPITOR) 20 MG tablet [Pharmacy Med Name: ATORVASTATIN 20MG  TABLETS] 90 tablet 0    Sig: TAKE 1 TABLET BY MOUTH EVERY DAY     Cardiovascular:  Antilipid - Statins Failed - 05/17/2022  6:15 AM      Failed - Valid encounter within last 12 months    Recent Outpatient Visits           10 months ago Encounter for screening mammogram for malignant  neoplasm of breast   [place] [facility] [place] [facility], Cammie Mcgee, MD   1 year ago Need for immunization against influenza   [place] Susy Frizzle, MD              Failed - Lipid Panel in normal range within the last 12 months    Cholesterol  Date Value Ref Range Status  06/16/2021 180 <200 mg/dL Final   LDL Cholesterol (Calc)  Date Value Ref Range Status  06/16/2021 92 mg/dL (calc) Final    Comment:    Reference range: <100 . Desirable range <100 mg/dL for primary prevention;   <70 mg/dL for patients with CHD or diabetic patients  with > or = 2 CHD risk factors. Marland Kitchen LDL-C is now calculated using the Martin-Hopkins  calculation, which is a validated novel method providing  better accuracy than the Friedewald equation in the  estimation of LDL-C.  Cresenciano Genre et al. Annamaria Helling. 4401;027(25): 2061-2068  (http://education.[facility].com/faq/FAQ164)  HDL  Date Value Ref Range Status  06/16/2021 73 > OR = 50 mg/dL Final   Triglycerides  Date Value Ref Range Status  06/16/2021 64 <150 mg/dL Final         Passed - Patient is not pregnant       losartan (COZAAR) 100 MG tablet [Pharmacy Med Name: LOSARTAN 100MG  TABLETS] 90 tablet 0    Sig: TAKE 1 TABLET BY MOUTH DAILY     Cardiovascular:  Angiotensin Receptor Blockers Failed - 05/17/2022  6:15 AM      Failed - K in normal range and within 180 days    Potassium  Date Value Ref Range Status  04/30/2022 3.4 (L) 3.5 - 5.1 mmol/L Final         Failed - Valid encounter within last 6 months    Recent Outpatient Visits           10 months ago Encounter for screening mammogram for malignant neoplasm of breast   [facility], MD   1 year ago Need for immunization against influenza   [facility], [facility], MD              Passed - Cr in normal range and within 180 days    Creat  Date Value Ref Range Status  06/16/2021 0.72 0.60 -  1.00 mg/dL Final   Creatinine, Ser  Date Value Ref Range Status  04/30/2022 0.74 0.44 - 1.00 mg/dL Final         Passed - Patient is not pregnant      Passed - Last BP in normal range    BP Readings from Last 1 Encounters:  04/30/22 (!) 123/53

## 2022-06-09 DIAGNOSIS — M19072 Primary osteoarthritis, left ankle and foot: Secondary | ICD-10-CM | POA: Diagnosis not present

## 2022-06-21 ENCOUNTER — Other Ambulatory Visit: Payer: Medicare PPO

## 2022-06-21 DIAGNOSIS — E039 Hypothyroidism, unspecified: Secondary | ICD-10-CM | POA: Diagnosis not present

## 2022-06-21 DIAGNOSIS — I10 Essential (primary) hypertension: Secondary | ICD-10-CM | POA: Diagnosis not present

## 2022-06-21 DIAGNOSIS — E7800 Pure hypercholesterolemia, unspecified: Secondary | ICD-10-CM | POA: Diagnosis not present

## 2022-06-22 LAB — CBC WITH DIFFERENTIAL/PLATELET
Absolute Monocytes: 582 cells/uL (ref 200–950)
Basophils Absolute: 70 cells/uL (ref 0–200)
Basophils Relative: 1.1 %
Eosinophils Absolute: 429 cells/uL (ref 15–500)
Eosinophils Relative: 6.7 %
HCT: 37.6 % (ref 35.0–45.0)
Hemoglobin: 12.1 g/dL (ref 11.7–15.5)
Lymphs Abs: 1043 cells/uL (ref 850–3900)
MCH: 27.1 pg (ref 27.0–33.0)
MCHC: 32.2 g/dL (ref 32.0–36.0)
MCV: 84.3 fL (ref 80.0–100.0)
MPV: 11.6 fL (ref 7.5–12.5)
Monocytes Relative: 9.1 %
Neutro Abs: 4275 cells/uL (ref 1500–7800)
Neutrophils Relative %: 66.8 %
Platelets: 290 10*3/uL (ref 140–400)
RBC: 4.46 10*6/uL (ref 3.80–5.10)
RDW: 13.8 % (ref 11.0–15.0)
Total Lymphocyte: 16.3 %
WBC: 6.4 10*3/uL (ref 3.8–10.8)

## 2022-06-22 LAB — TSH: TSH: 1.36 mIU/L (ref 0.40–4.50)

## 2022-06-22 LAB — COMPLETE METABOLIC PANEL WITH GFR
AG Ratio: 1.7 (calc) (ref 1.0–2.5)
ALT: 13 U/L (ref 6–29)
AST: 15 U/L (ref 10–35)
Albumin: 4 g/dL (ref 3.6–5.1)
Alkaline phosphatase (APISO): 119 U/L (ref 37–153)
BUN: 20 mg/dL (ref 7–25)
CO2: 24 mmol/L (ref 20–32)
Calcium: 9.6 mg/dL (ref 8.6–10.4)
Chloride: 107 mmol/L (ref 98–110)
Creat: 0.76 mg/dL (ref 0.60–1.00)
Globulin: 2.4 g/dL (calc) (ref 1.9–3.7)
Glucose, Bld: 89 mg/dL (ref 65–99)
Potassium: 4.8 mmol/L (ref 3.5–5.3)
Sodium: 142 mmol/L (ref 135–146)
Total Bilirubin: 0.6 mg/dL (ref 0.2–1.2)
Total Protein: 6.4 g/dL (ref 6.1–8.1)
eGFR: 83 mL/min/{1.73_m2} (ref >=60)

## 2022-06-22 LAB — LIPID PANEL
Cholesterol: 170 mg/dL (ref <200)
HDL: 69 mg/dL (ref >=50)
LDL Cholesterol (Calc): 82 mg/dL (calc)
Non-HDL Cholesterol (Calc): 101 mg/dL (calc) (ref <130)
Total CHOL/HDL Ratio: 2.5 (calc) (ref <5.0)
Triglycerides: 93 mg/dL (ref <150)

## 2022-06-23 DIAGNOSIS — H53143 Visual discomfort, bilateral: Secondary | ICD-10-CM | POA: Diagnosis not present

## 2022-06-23 DIAGNOSIS — H5213 Myopia, bilateral: Secondary | ICD-10-CM | POA: Diagnosis not present

## 2022-06-23 DIAGNOSIS — H2513 Age-related nuclear cataract, bilateral: Secondary | ICD-10-CM | POA: Diagnosis not present

## 2022-06-23 DIAGNOSIS — H52223 Regular astigmatism, bilateral: Secondary | ICD-10-CM | POA: Diagnosis not present

## 2022-06-23 DIAGNOSIS — H524 Presbyopia: Secondary | ICD-10-CM | POA: Diagnosis not present

## 2022-06-23 DIAGNOSIS — H43393 Other vitreous opacities, bilateral: Secondary | ICD-10-CM | POA: Diagnosis not present

## 2022-06-24 ENCOUNTER — Encounter: Payer: Self-pay | Admitting: Family Medicine

## 2022-06-29 ENCOUNTER — Encounter: Payer: Self-pay | Admitting: Family Medicine

## 2022-06-29 ENCOUNTER — Ambulatory Visit (INDEPENDENT_AMBULATORY_CARE_PROVIDER_SITE_OTHER): Payer: Medicare PPO | Admitting: Family Medicine

## 2022-06-29 VITALS — BP 128/62 | HR 95 | Ht 62.5 in | Wt 178.2 lb

## 2022-06-29 DIAGNOSIS — Z0001 Encounter for general adult medical examination with abnormal findings: Secondary | ICD-10-CM

## 2022-06-29 DIAGNOSIS — E7800 Pure hypercholesterolemia, unspecified: Secondary | ICD-10-CM

## 2022-06-29 DIAGNOSIS — E039 Hypothyroidism, unspecified: Secondary | ICD-10-CM | POA: Diagnosis not present

## 2022-06-29 DIAGNOSIS — I10 Essential (primary) hypertension: Secondary | ICD-10-CM

## 2022-06-29 DIAGNOSIS — Z0000 Encounter for general adult medical examination without abnormal findings: Secondary | ICD-10-CM

## 2022-06-29 NOTE — Progress Notes (Signed)
Subjective:    Patient ID: Jean Baldwin, female    DOB: October 10, 1950, 72 y.o.   MRN: [id number]  HPI Patient is a very pleasant 72 year old Caucasian female here today for complete physical exam.  Last mammogram was 4/23.  Last DEXA was 2021 and showed osteopenia.  Patient had a colonoscopy 06/2021 (Dr. Earlean Shawl) and was completely clear.  Due again in 2028 (Dr. Paulita Fujita).  Shown below, all of her immunizations are UTD. Immunization History  Administered Date(s) Administered   Fluad Quad(high Dose 65+) 02/20/2021, 03/05/2022   Influenza Split 02/25/2016, 02/27/2017   Influenza,inj,Quad PF,6+ Mos 02/14/2018, 02/02/2019, 02/19/2020   Influenza,inj,quad, With Preservative 02/14/2018, 02/02/2019, 02/19/2020   Influenza-Unspecified 02/25/2016, 02/27/2017   PFIZER(Purple Top)SARS-COV-2 Vaccination 07/02/2019, 07/23/2019, 02/10/2020   Pfizer Covid-19 Vaccine Bivalent Booster 73yr & up 05/31/2021   Pneumococcal Conjugate-13 04/09/2016, 04/09/2016   Pneumococcal Polysaccharide-23 04/26/2017   Pneumococcal-Unspecified 04/26/2017   Zoster Recombinat (Shingrix) 01/09/2019, 04/10/2019   Zoster, Live 01/09/2019, 04/10/2019    Lab on 06/21/2022  Component Date Value Ref Range Status   WBC 06/21/2022 6.4  3.8 - 10.8 Thousand/uL Final   RBC 06/21/2022 4.46  3.80 - 5.10 Million/uL Final   Hemoglobin 06/21/2022 12.1  11.7 - 15.5 g/dL Final   HCT 06/21/2022 37.6  35.0 - 45.0 % Final   MCV 06/21/2022 84.3  80.0 - 100.0 fL Final   MCH 06/21/2022 27.1  27.0 - 33.0 pg Final   MCHC 06/21/2022 32.2  32.0 - 36.0 g/dL Final   RDW 06/21/2022 13.8  11.0 - 15.0 % Final   Platelets 06/21/2022 290  140 - 400 Thousand/uL Final   MPV 06/21/2022 11.6  7.5 - 12.5 fL Final   Neutro Abs 06/21/2022 4,275  1,500 - 7,800 cells/uL Final   Lymphs Abs 06/21/2022 1,043  850 - 3,900 cells/uL Final   Absolute Monocytes 06/21/2022 582  200 - 950 cells/uL Final   Eosinophils Absolute 06/21/2022 429  15 - 500 cells/uL Final    Basophils Absolute 06/21/2022 70  0 - 200 cells/uL Final   Neutrophils Relative % 06/21/2022 66.8  % Final   Total Lymphocyte 06/21/2022 16.3  % Final   Monocytes Relative 06/21/2022 9.1  % Final   Eosinophils Relative 06/21/2022 6.7  % Final   Basophils Relative 06/21/2022 1.1  % Final   Glucose, Bld 06/21/2022 89  65 - 99 mg/dL Final   Comment: .            Fasting reference interval .    BUN 06/21/2022 20  7 - 25 mg/dL Final   Creat 06/21/2022 0.76  0.60 - 1.00 mg/dL Final   eGFR 06/21/2022 83  > OR = 60 mL/min/1.758mFinal   BUN/Creatinine Ratio 06/21/2022 SEE NOTE:  6 - 22 (calc) Final   Comment:    Not Reported: BUN and Creatinine are within    reference range. .    Sodium 06/21/2022 142  135 - 146 mmol/L Final   Potassium 06/21/2022 4.8  3.5 - 5.3 mmol/L Final   Chloride 06/21/2022 107  98 - 110 mmol/L Final   CO2 06/21/2022 24  20 - 32 mmol/L Final   Calcium 06/21/2022 9.6  8.6 - 10.4 mg/dL Final   Total Protein 06/21/2022 6.4  6.1 - 8.1 g/dL Final   Albumin 06/21/2022 4.0  3.6 - 5.1 g/dL Final   Globulin 06/21/2022 2.4  1.9 - 3.7 g/dL (calc) Final   AG Ratio 06/21/2022 1.7  1.0 - 2.5 (calc) Final  Total Bilirubin 06/21/2022 0.6  0.2 - 1.2 mg/dL Final   Alkaline phosphatase (APISO) 06/21/2022 119  37 - 153 U/L Final   AST 06/21/2022 15  10 - 35 U/L Final   ALT 06/21/2022 13  6 - 29 U/L Final   Cholesterol 06/21/2022 170  <200 mg/dL Final   HDL 06/21/2022 69  > OR = 50 mg/dL Final   Triglycerides 06/21/2022 93  <150 mg/dL Final   LDL Cholesterol (Calc) 06/21/2022 82  mg/dL (calc) Final   Comment: Reference range: <100 . Desirable range <100 mg/dL for primary prevention;   <70 mg/dL for patients with CHD or diabetic patients  with > or = 2 CHD risk factors. Marland Kitchen LDL-C is now calculated using the Martin-Hopkins  calculation, which is a validated novel method providing  better accuracy than the Friedewald equation in the  estimation of LDL-C.  Cresenciano Genre et al. Annamaria Helling.  [id number]): 2061-2068  (http://education.[facility].com/faq/FAQ164)    Total CHOL/HDL Ratio 06/21/2022 2.5  <5.0 (calc) Final   Non-HDL Cholesterol (Calc) 06/21/2022 101  <130 mg/dL (calc) Final   Comment: For patients with diabetes plus 1 major ASCVD risk  factor, treating to a non-HDL-C goal of <100 mg/dL  (LDL-C of <70 mg/dL) is considered a therapeutic  option.    TSH 06/21/2022 1.36  0.40 - 4.50 mIU/L Final    Past Medical History:  Diagnosis Date   Arthritis    knees   Dental crowns present    High cholesterol    Hypertension    states under control with meds., has been on med > 20 yr.   Hypothyroidism    Osteopenia    TFCC (triangular fibrocartilage complex) tear 03/2017   right wrist/wrist   Past Surgical History:  Procedure Laterality Date   KNEE ARTHROSCOPY W/ MENISCAL REPAIR Left    NASAL SEPTUM SURGERY     TOTAL HIP ARTHROPLASTY Right 07/12/2017   Procedure: RIGHT TOTAL HIP ARTHROPLASTY ANTERIOR APPROACH;  Surgeon: Mcarthur Rossetti, MD;  Location: [facility];  Service: Orthopedics;  Laterality: Right;   TUBAL LIGATION     WRIST ARTHROSCOPY WITH DEBRIDEMENT Right 04/12/2017   Procedure: RIGHT WRIST ARTHROSCOPY WITH DEBRIDEMENT;  Surgeon: Daryll Brod, MD;  Location: [facility];  Service: Orthopedics;  Laterality: Right;   Current Outpatient Medications on File Prior to Visit  Medication Sig Dispense Refill   amLODipine (NORVASC) 5 MG tablet TAKE 1 TABLET BY MOUTH TWICE DAILY 180 tablet 1   Apple Cider Vinegar 600 MG CAPS Take by mouth.     aspirin 81 MG chewable tablet Chew 1 tablet (81 mg total) by mouth 2 (two) times daily. 35 tablet 0   atorvastatin (LIPITOR) 20 MG tablet TAKE 1 TABLET BY MOUTH EVERY DAY 90 tablet 0   Calcium Carb-Cholecalciferol (CALCIUM/VITAMIN D) 600-400 MG-UNIT TABS Take 2 tablets by mouth daily.     cetirizine (ZYRTEC) 10 MG tablet Take 10 mg by mouth daily.     levothyroxine (SYNTHROID) 100 MCG tablet TAKE 1  TABLET(100 MCG) BY MOUTH DAILY 90 tablet 1   losartan (COZAAR) 100 MG tablet TAKE 1 TABLET BY MOUTH DAILY 90 tablet 1   meloxicam (MOBIC) 15 MG tablet TAKE 1 TABLET BY MOUTH EVERY DAY AS NEEDED FOR JOINT PAIN 90 tablet 3   metoCLOPramide (REGLAN) 10 MG tablet Take 1 tablet (10 mg total) by mouth every 6 (six) hours. 20 tablet 0   tamsulosin (FLOMAX) 0.4 MG CAPS capsule TAKE 1 CAPSULE(0.4 MG) BY MOUTH DAILY  90 capsule 3   vitamin C (ASCORBIC ACID) 500 MG tablet Take 500 mg by mouth daily.     zinc gluconate 50 MG tablet Take 50 mg by mouth daily.     No current facility-administered medications on file prior to visit.   Allergies  Allergen Reactions   Sulfamethoxazole Other (See Comments)   Codeine Nausea And Vomiting   Sulfa Antibiotics Rash    Social History   Socioeconomic History   Marital status: Married    Spouse name: Not on file   Number of children: Not on file   Years of education: Not on file   Highest education level: Not on file  Occupational History   Not on file  Tobacco Use   Smoking status: Never   Smokeless tobacco: Never  Vaping Use   Vaping Use: Never used  Substance and Sexual Activity   Alcohol use: No   Drug use: No   Sexual activity: Yes  Other Topics Concern   Not on file  Social History Narrative   Not on file   Social Determinants of Health   Financial Resource Strain: Not on file  Food Insecurity: Not on file  Transportation Needs: Not on file  Physical Activity: Not on file  Stress: Not on file  Social Connections: Not on file  Intimate Partner Violence: Not on file     Review of Systems  All other systems reviewed and are negative.      Objective:   Physical Exam Constitutional:      Appearance: Normal appearance. She is normal weight.  Cardiovascular:     Rate and Rhythm: Normal rate and regular rhythm.     Pulses: Normal pulses.     Heart sounds: Normal heart sounds. No murmur heard.    No gallop.  Pulmonary:      Effort: Pulmonary effort is normal. No respiratory distress.     Breath sounds: Normal breath sounds. No stridor. No wheezing, rhonchi or rales.  Abdominal:     General: Bowel sounds are normal.     Palpations: Abdomen is soft.  Musculoskeletal:     Right lower leg: No edema.     Left lower leg: No edema.  Neurological:     General: No focal deficit present.     Mental Status: She is alert and oriented to person, place, and time.           Assessment & Plan:  General medical exam  Benign essential HTN  Hypothyroidism, unspecified type  Pure hypercholesterolemia Patient's physical exam today is completely normal.  I recommended she get the COVID booster.  Also recommended that she consider getting an RSV vaccine.  The remainder of her immunizations are up-to-date.  She states that she will schedule her mammogram in April.  Her Pap smear is not necessary due to age greater than 79.  Colonoscopy is not due again until 2028.  She defers a bone density until next year.  She is taking calcium 1200 mg a day and vitamin D 1000 units a day.  Her blood pressure and her lab work is outstanding.  She denies any falls or depression or memory loss.  The remainder of her preventative care is up-to-date

## 2022-06-30 ENCOUNTER — Other Ambulatory Visit: Payer: Self-pay | Admitting: Family Medicine

## 2022-06-30 DIAGNOSIS — Z1231 Encounter for screening mammogram for malignant neoplasm of breast: Secondary | ICD-10-CM

## 2022-07-13 DIAGNOSIS — G4733 Obstructive sleep apnea (adult) (pediatric): Secondary | ICD-10-CM | POA: Diagnosis not present

## 2022-08-13 DIAGNOSIS — G4733 Obstructive sleep apnea (adult) (pediatric): Secondary | ICD-10-CM | POA: Diagnosis not present

## 2022-08-15 ENCOUNTER — Other Ambulatory Visit: Payer: Self-pay | Admitting: Family Medicine

## 2022-08-16 NOTE — Telephone Encounter (Signed)
Requested Prescriptions  Pending Prescriptions Disp Refills   atorvastatin (LIPITOR) 20 MG tablet [Pharmacy Med Name: ATORVASTATIN 20MG  TABLETS] 90 tablet 3    Sig: TAKE 1 TABLET BY MOUTH EVERY DAY     Cardiovascular:  Antilipid - Statins Failed - 08/15/2022  6:13 AM      Failed - Valid encounter within last 12 months    Recent Outpatient Visits           1 year ago Encounter for screening mammogram for malignant neoplasm of breast   [facility] Pickard, Priscille Heidelberg, MD   1 year ago Need for immunization against influenza   [facility] Pickard, Priscille Heidelberg, MD       Future Appointments             In 10 months Tanya Nones, Priscille Heidelberg, MD [facility]            Failed - Lipid Panel in normal range within the last 12 months    Cholesterol  Date Value Ref Range Status  06/21/2022 170 <200 mg/dL Final   LDL Cholesterol (Calc)  Date Value Ref Range Status  06/21/2022 82 mg/dL (calc) Final    Comment:    Reference range: <100 . Desirable range <100 mg/dL for primary prevention;   <70 mg/dL for patients with CHD or diabetic patients  with > or = 2 CHD risk factors. Marland Kitchen LDL-C is now calculated using the Martin-Hopkins  calculation, which is a validated novel method providing  better accuracy than the Friedewald equation in the  estimation of LDL-C.  Horald Pollen et al. Lenox Ahr. 9509;326(71): 2061-2068  (http://education.[facility].com/faq/FAQ164)    HDL  Date Value Ref Range Status  06/21/2022 69 > OR = 50 mg/dL Final   Triglycerides  Date Value Ref Range Status  06/21/2022 93 <150 mg/dL Final         Passed - Patient is not pregnant

## 2022-08-23 ENCOUNTER — Ambulatory Visit
Admission: RE | Admit: 2022-08-23 | Discharge: 2022-08-23 | Disposition: A | Discharge status: Home / Self Care | Payer: Medicare PPO | Source: Ambulatory Visit | Attending: Family Medicine | Admitting: Family Medicine

## 2022-08-23 DIAGNOSIS — Z1231 Encounter for screening mammogram for malignant neoplasm of breast: Secondary | ICD-10-CM

## 2022-09-10 IMAGING — MG MM DIGITAL SCREENING BILAT W/ TOMO AND CAD
8 series · 8 of 24 positions shown · non-contrast
Comparison: Previous exam(s).

CLINICAL DATA: Screening.

EXAM:
DIGITAL SCREENING BILATERAL MAMMOGRAM WITH TOMOSYNTHESIS AND CAD
TECHNIQUE: Bilateral screening digital craniocaudal and mediolateral oblique
mammograms were obtained. Bilateral screening digital breast
tomosynthesis was performed. The images were evaluated with
computer-aided detection.

[R MLO synth-2D]
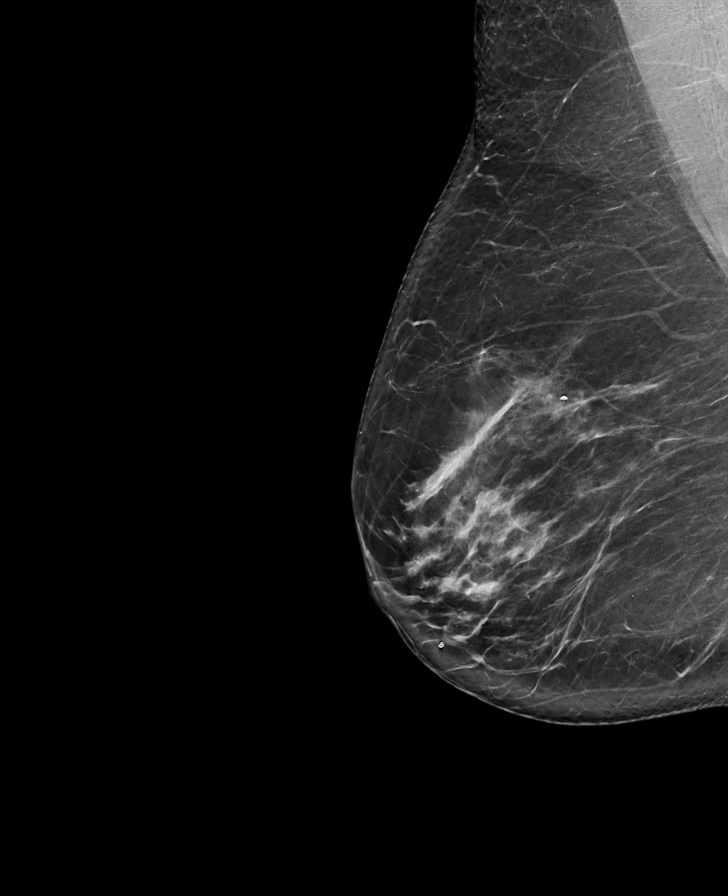

[L MLO synth-2D]
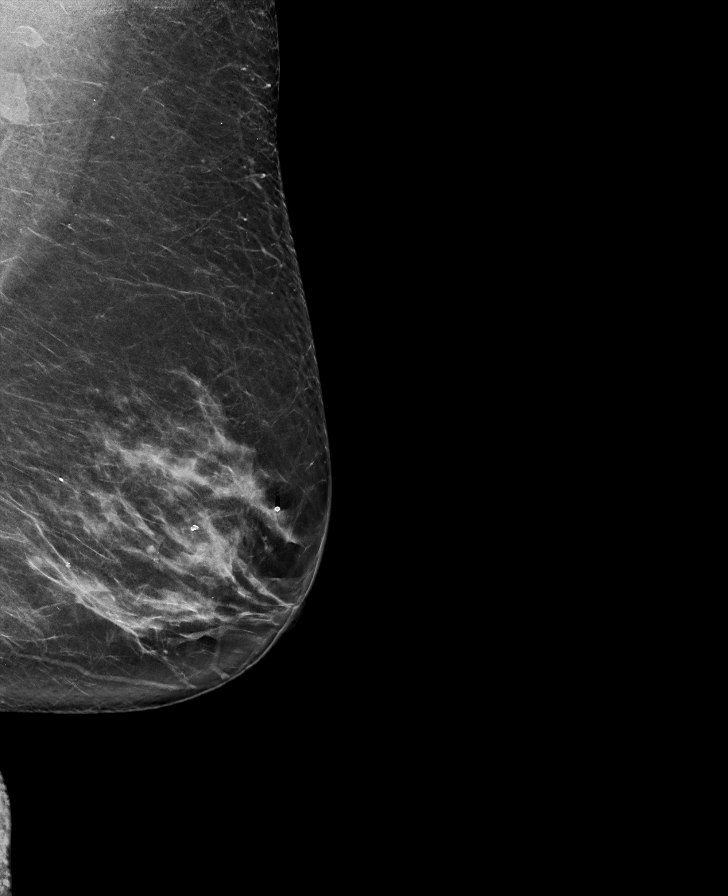

[R CC synth-2D]
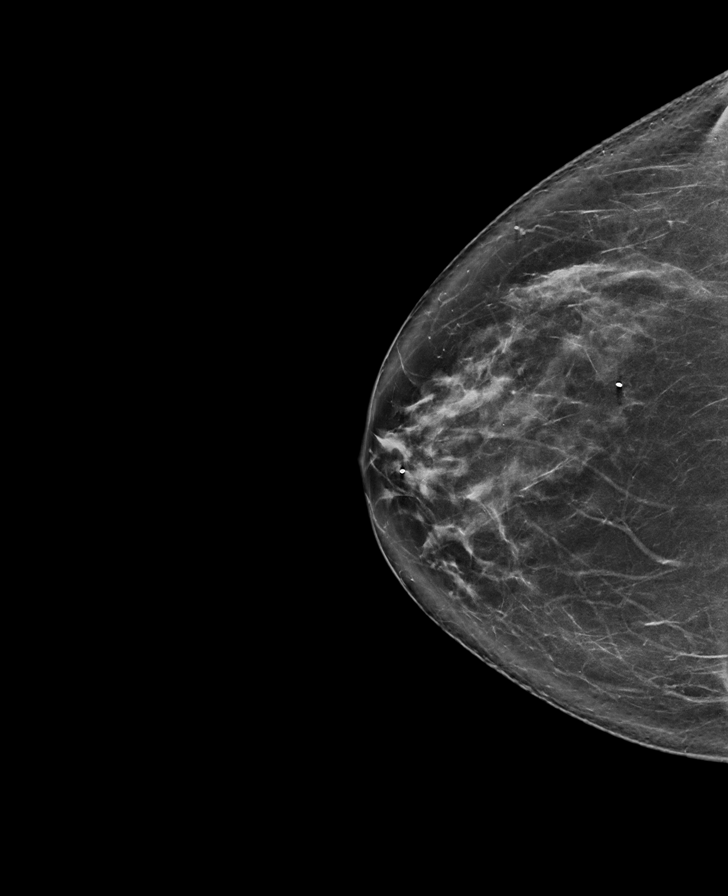

[L CC synth-2D]
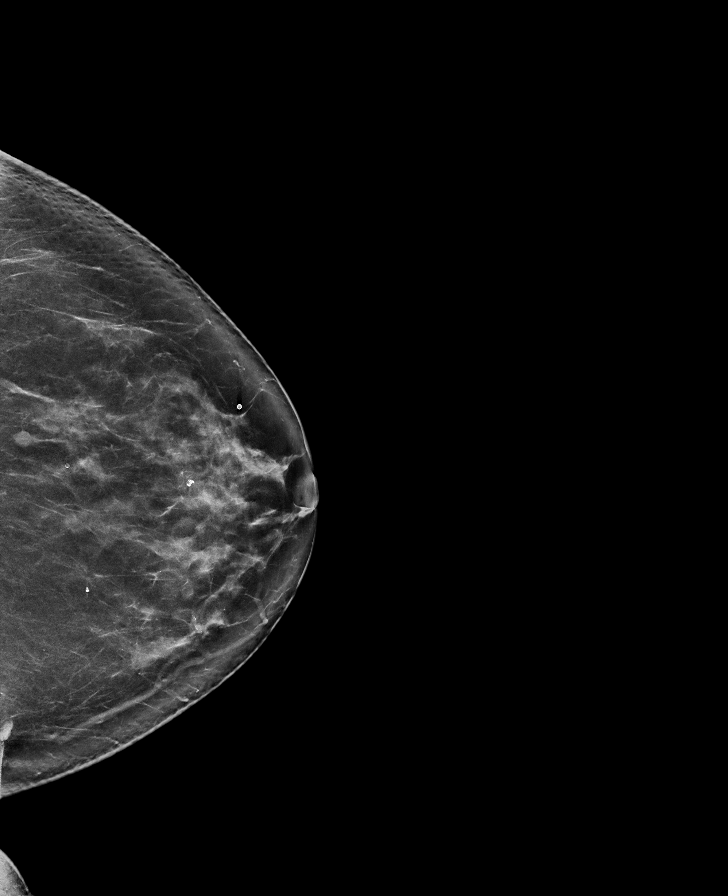

[L MLO tomo · tomo slice 43/86.0]
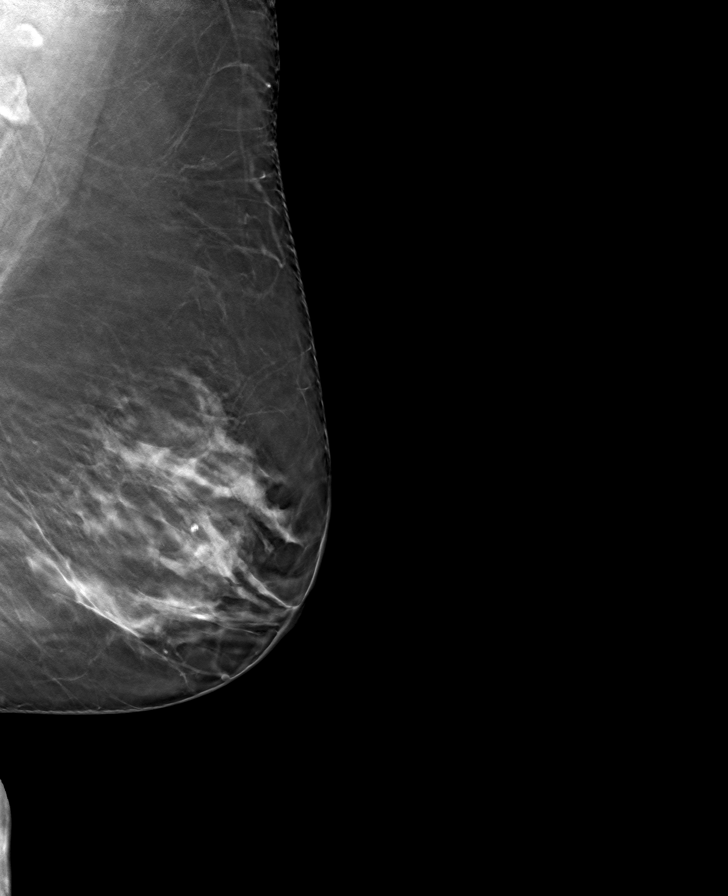

[L CC tomo · tomo slice 44/87.0]
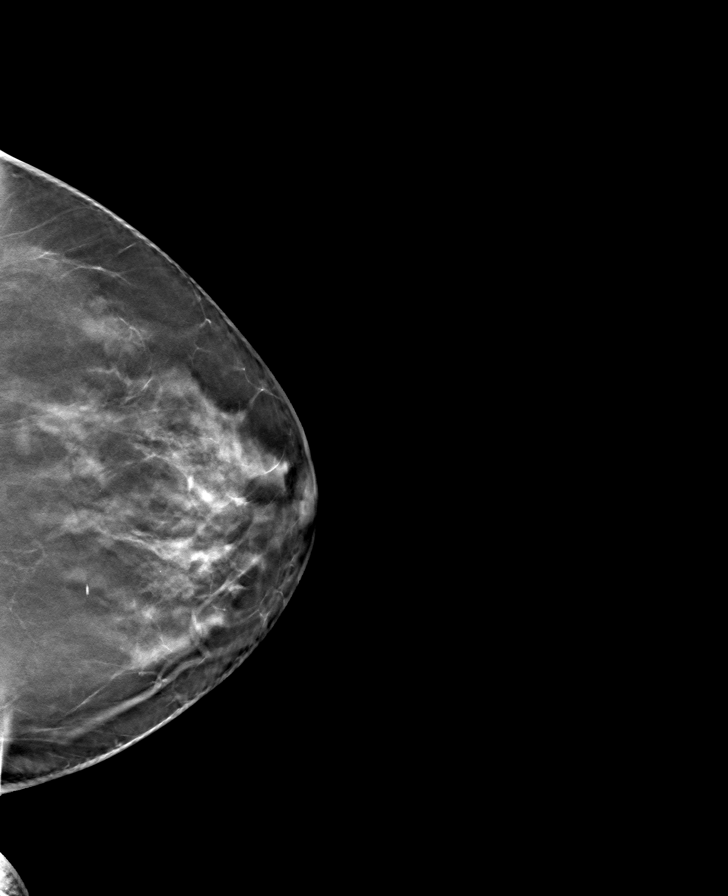

[R CC tomo · tomo slice 43/84.0]
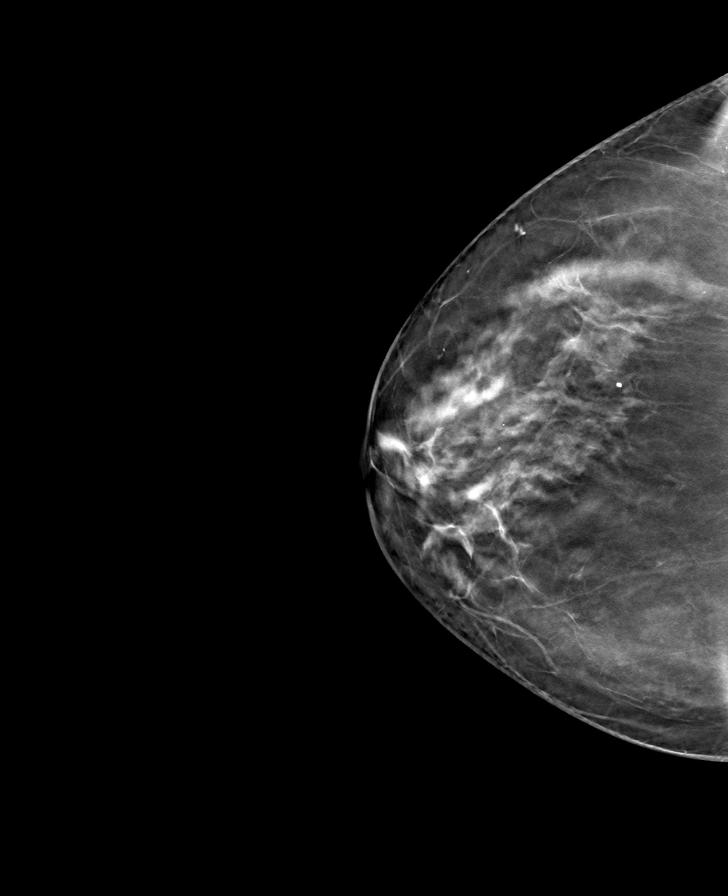

[R MLO tomo · tomo slice 45/88.0]
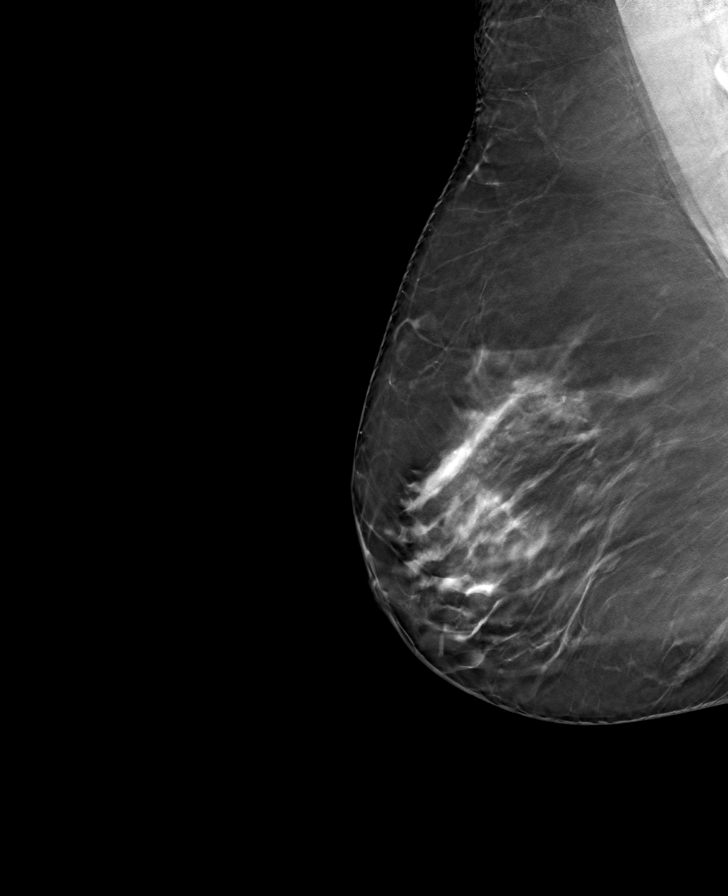

[8 of 24 positions shown; findings below may reference images not displayed]

ACR Breast Density Category b: There are scattered areas of
fibroglandular density.
FINDINGS: In the left breast, a possible mass warrants further evaluation. In
the right breast, no findings suspicious for malignancy.
IMPRESSION: Further evaluation is suggested for possible mass in the left
breast.

RECOMMENDATION:
Ultrasound of the left breast. (Code:[id number]-DD6)

The patient will be contacted regarding the findings, and additional
imaging will be scheduled.

BI-RADS CATEGORY  0: Incomplete. Need additional imaging evaluation
and/or prior mammograms for comparison.

## 2022-09-12 DIAGNOSIS — G4733 Obstructive sleep apnea (adult) (pediatric): Secondary | ICD-10-CM | POA: Diagnosis not present

## 2022-09-17 DIAGNOSIS — M1712 Unilateral primary osteoarthritis, left knee: Secondary | ICD-10-CM | POA: Diagnosis not present

## 2022-09-17 DIAGNOSIS — M19072 Primary osteoarthritis, left ankle and foot: Secondary | ICD-10-CM | POA: Diagnosis not present

## 2022-09-17 DIAGNOSIS — M19012 Primary osteoarthritis, left shoulder: Secondary | ICD-10-CM | POA: Diagnosis not present

## 2022-09-22 ENCOUNTER — Ambulatory Visit: Payer: Medicare PPO | Admitting: Family Medicine

## 2022-09-22 ENCOUNTER — Encounter: Payer: Self-pay | Admitting: Family Medicine

## 2022-09-22 VITALS — BP 152/70 | HR 80 | Temp 98.5°F | Ht 62.0 in | Wt 180.0 lb

## 2022-09-22 DIAGNOSIS — S30861A Insect bite (nonvenomous) of abdominal wall, initial encounter: Secondary | ICD-10-CM

## 2022-09-22 DIAGNOSIS — W57XXXA Bitten or stung by nonvenomous insect and other nonvenomous arthropods, initial encounter: Secondary | ICD-10-CM

## 2022-09-22 NOTE — Assessment & Plan Note (Signed)
Mild erythema to right lower abdomen approximately 3cm diameter. No systemic symptoms. This was a lone star tick. Patient is concerned about alpha gal, we discussed signs and symptoms and when to return to office for testing. Return to office if abdominal redness does not improve or for s/s of red meat intolerance.

## 2022-09-22 NOTE — Progress Notes (Addendum)
   Acute Office Visit  Subjective:     Patient ID: Jean Baldwin, female    DOB: [birth year], 72 y.o.   MRN: [id number]  Chief Complaint  Patient presents with   Follow-up     tick bite/very tender/red circle around bite      HPI Patient is in today for tick bite on Saturday on her abdomen. It was a few hours after she walked her dog when she noticed and it was partially attached, she removed the tick and its head. Has had a red rash around the bite site and has been applying alcohol to the redness. She denies fever, body aches, fatigue. She did bring the tick and it is a lone star tick and not engorged.  Review of Systems  All other systems reviewed and are negative.       Objective:    BP (!) 152/70   Pulse 80   Temp 98.5 F (36.9 C) (Oral)   Ht 5\' 2"  (1.575 m)   Wt 180 lb (81.6 kg)   SpO2 96%   BMI 32.92 kg/m    Physical Exam Vitals and nursing note reviewed.  Constitutional:      Appearance: Normal appearance. She is normal weight.  HENT:     Head: Normocephalic and atraumatic.  Skin:    General: Skin is warm and dry.     Findings: Erythema present.       Neurological:     General: No focal deficit present.     Mental Status: She is alert and oriented to person, place, and time. Mental status is at baseline.  Psychiatric:        Mood and Affect: Mood normal.        Behavior: Behavior normal.        Thought Content: Thought content normal.        Judgment: Judgment normal.     No results found for any visits on 09/22/22.      Assessment & Plan:   Problem List Items Addressed This Visit     Tick bite of abdomen - Primary    Mild erythema to right lower abdomen approximately 3cm diameter. No systemic symptoms. This was a lone star tick. Patient is concerned about alpha gal, we discussed signs and symptoms and when to return to office for testing. Return to office if abdominal redness does not improve or for s/s of red meat intolerance.        No orders of the defined types were placed in this encounter.   Return if symptoms worsen or fail to improve.  Park Meo, FNP

## 2022-09-23 IMAGING — US US BREAST*L* LIMITED INC AXILLA
1 series · 5 of 5 positions shown · non-contrast
Comparison: Previous exams including recent screening mammogram
dated 08/21/2021.

CLINICAL DATA: Patient returns today to evaluate a LEFT breast mass
identified on recent screening mammogram.

EXAM:
ULTRASOUND OF THE LEFT BREAST

[Series 1: us breast*left* limited inc axilla · 0.07mm/px · 5 of 5 slices shown]
[im 1/5]
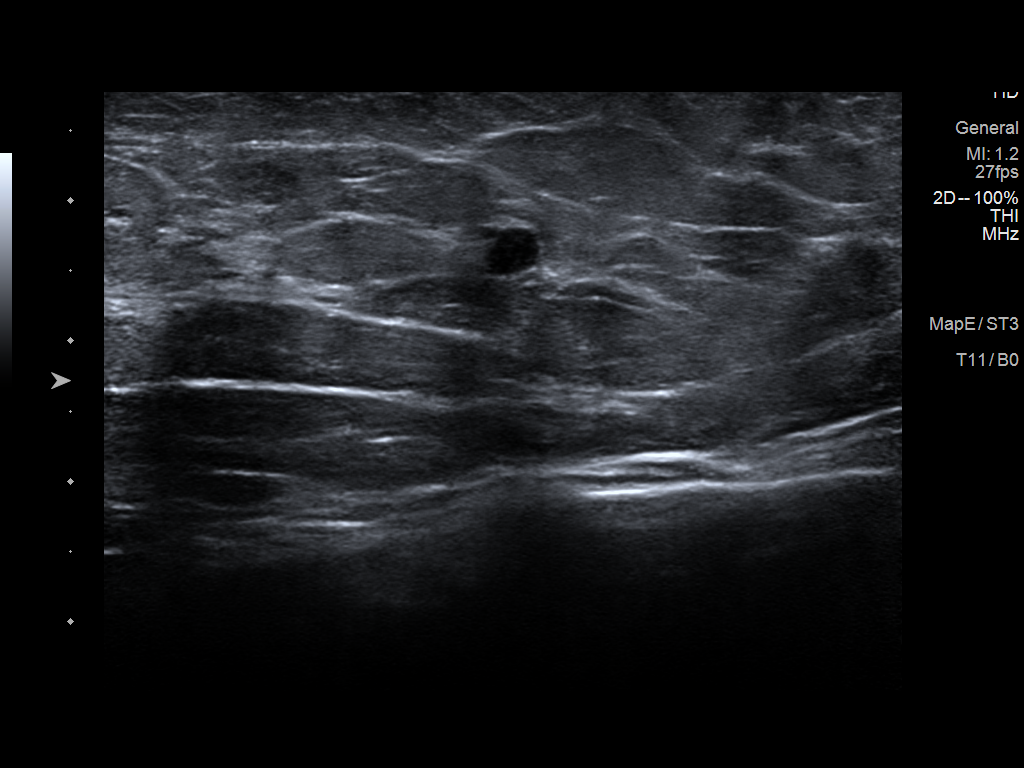
[im 2/5]
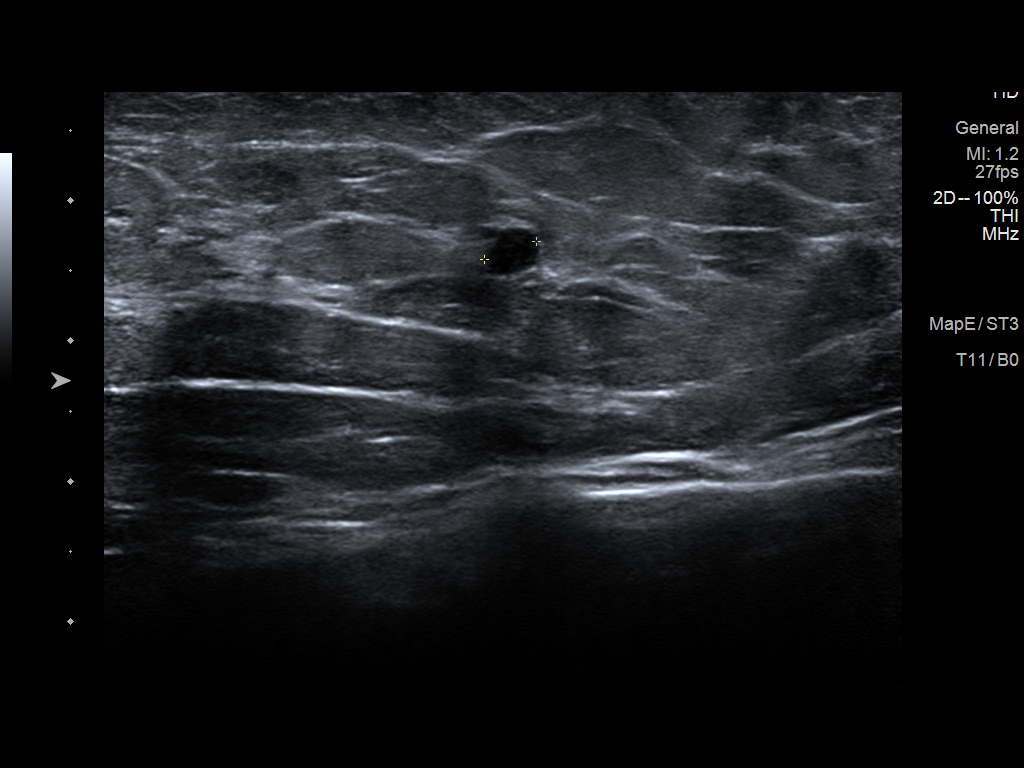
[im 3/5]
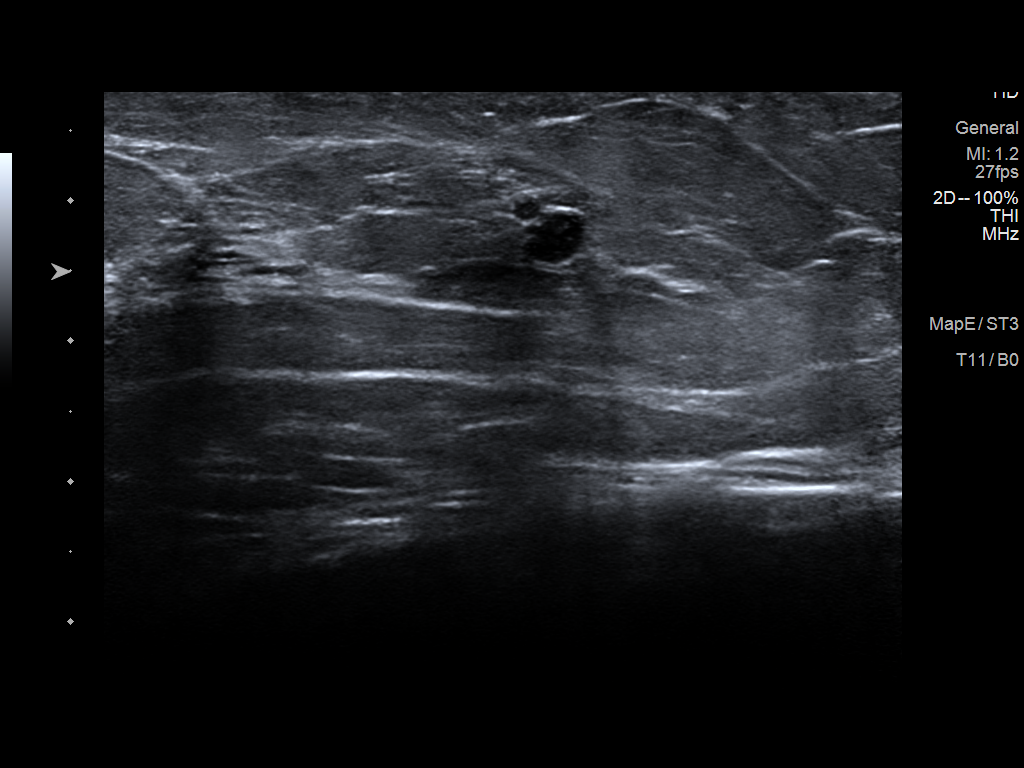
[im 4/5]
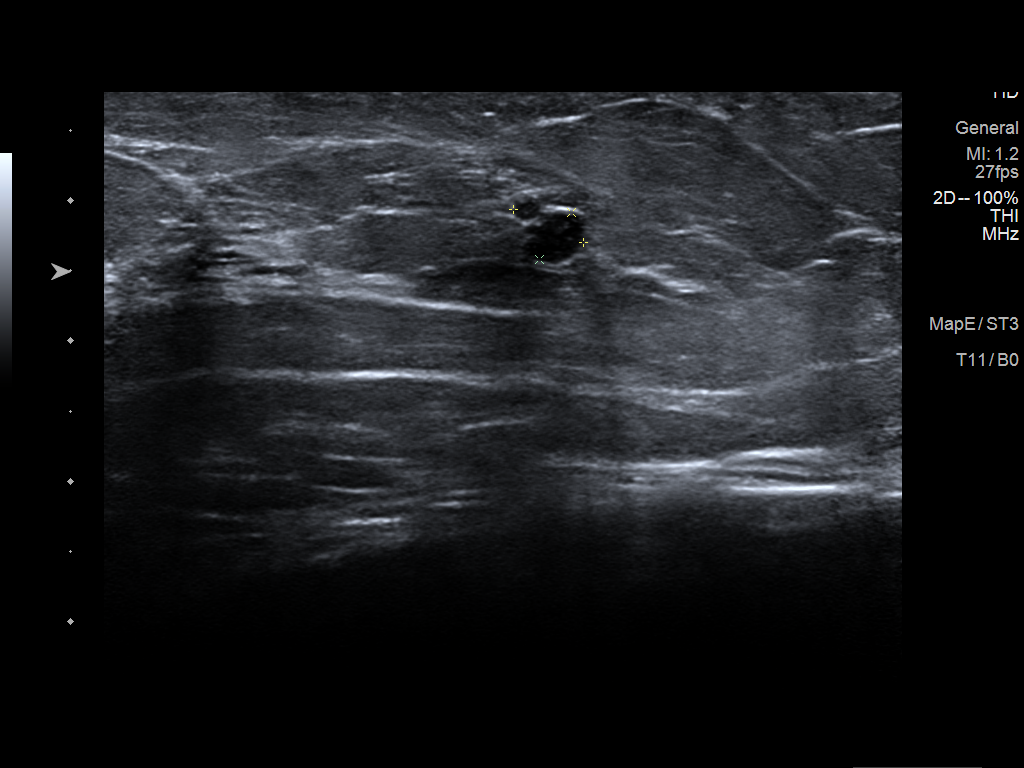
[im 5/5]
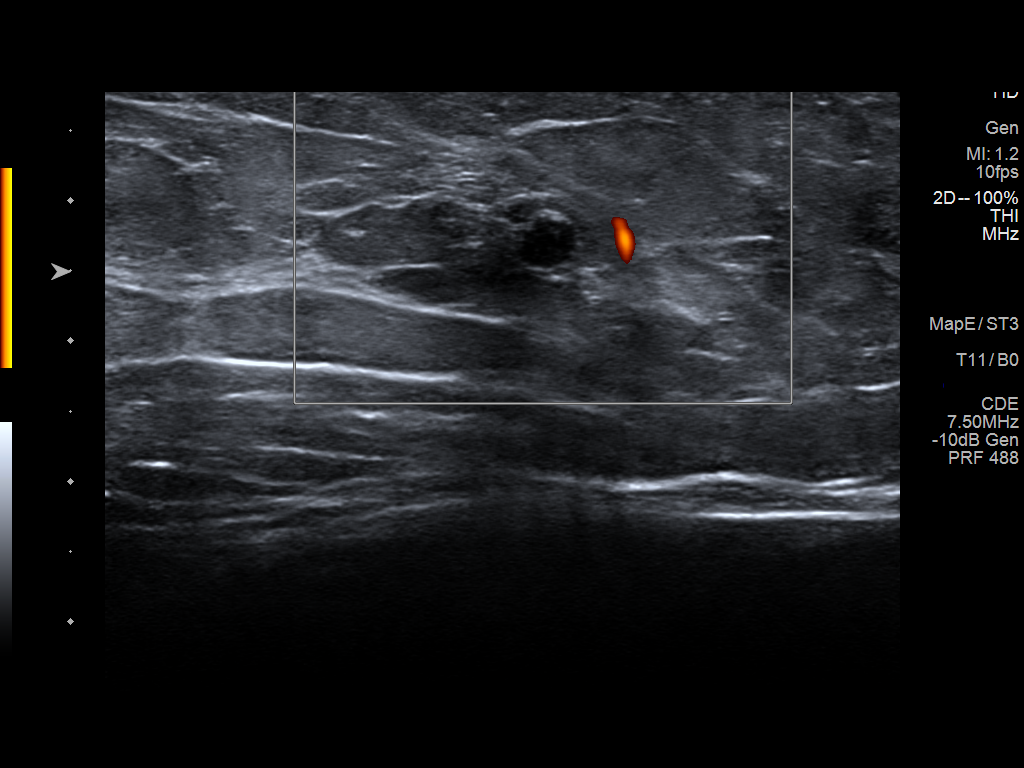

[5 of 5 positions shown; findings below may reference images not displayed]

FINDINGS: Targeted ultrasound is performed, showing a benign cyst/cluster of
cysts in the LEFT breast at the 4 o'clock axis, 4 cm from the
nipple, overall measuring 6 mm, main component measuring 4 mm,
corresponding to the mammographic finding. No additional solid or
cystic mass is seen within the outer or lower LEFT breast.
IMPRESSION: No evidence of malignancy. Benign cyst/cluster of cysts in the LEFT
breast at the 4 o'clock axis, measuring 6 mm, corresponding to the
mammographic finding.

Patient may return to routine annual bilateral screening mammogram
schedule.

RECOMMENDATION:
Screening mammogram in one year.(Code:[id number])

I have discussed the findings and recommendations with the patient.
If applicable, a reminder letter will be sent to the patient
regarding the next appointment.

BI-RADS CATEGORY  2: Benign.

## 2022-10-20 DIAGNOSIS — M1712 Unilateral primary osteoarthritis, left knee: Secondary | ICD-10-CM | POA: Diagnosis not present

## 2022-10-25 DIAGNOSIS — G4733 Obstructive sleep apnea (adult) (pediatric): Secondary | ICD-10-CM | POA: Diagnosis not present

## 2022-11-13 ENCOUNTER — Other Ambulatory Visit: Payer: Self-pay | Admitting: Family Medicine

## 2022-11-15 NOTE — Telephone Encounter (Signed)
Requested Prescriptions  Pending Prescriptions Disp Refills   levothyroxine (SYNTHROID) 100 MCG tablet [Pharmacy Med Name: LEVOTHYROXINE 0.100MG  ( ) TAB] 90 tablet 0    Sig: TAKE 1 TABLET(100 MCG) BY MOUTH DAILY     Endocrinology:  Hypothyroid Agents Failed - 11/13/2022  6:13 AM      Failed - Valid encounter within last 12 months    Recent Outpatient Visits           1 year ago Encounter for screening mammogram for malignant neoplasm of breast   [facility] Pickard, Priscille Heidelberg, MD   1 year ago Need for immunization against influenza   [facility] Pickard, Priscille Heidelberg, MD       Future Appointments             In 7 months Pickard, Priscille Heidelberg, MD [place] [facility]            Passed - TSH in normal range and within 360 days    TSH  Date Value Ref Range Status  06/21/2022 1.36 0.40 - 4.50 mIU/L Final          amLODipine (NORVASC) 5 MG tablet [Pharmacy Med Name: AMLODIPINE BESYLATE 5MG  TABLETS] 180 tablet 0    Sig: TAKE 1 TABLET BY MOUTH TWICE DAILY     Cardiovascular: Calcium Channel Blockers 2 Failed - 11/13/2022  6:13 AM      Failed - Last BP in normal range    BP Readings from Last 1 Encounters:  09/22/22 (!) 152/70         Failed - Valid encounter within last 6 months    Recent Outpatient Visits           1 year ago Encounter for screening mammogram for malignant neoplasm of breast   [facility] Tanya Nones, Priscille Heidelberg, MD   1 year ago Need for immunization against influenza   [facility] Pickard, Priscille Heidelberg, MD       Future Appointments             In 7 months Pickard, Priscille Heidelberg, MD [place] [facility]            Passed - Last Heart Rate in normal range    Pulse Readings from Last 1 Encounters:  09/22/22 80          losartan (COZAAR) 100 MG tablet [Pharmacy Med Name: LOSARTAN 100MG  TABLETS] 90 tablet 0    Sig: TAKE 1 TABLET BY MOUTH DAILY      Cardiovascular:  Angiotensin Receptor Blockers Failed - 11/13/2022  6:13 AM      Failed - Last BP in normal range    BP Readings from Last 1 Encounters:  09/22/22 (!) 152/70         Failed - Valid encounter within last 6 months    Recent Outpatient Visits           1 year ago Encounter for screening mammogram for malignant neoplasm of breast   [facility] Donita Brooks, MD   1 year ago Need for immunization against influenza   [facility] Pickard, Priscille Heidelberg, MD       Future Appointments             In 7 months Pickard, Priscille Heidelberg, MD [facility], [facility]  Passed - Cr in normal range and within 180 days    Creat  Date Value Ref Range Status  06/21/2022 0.76 0.60 - 1.00 mg/dL Final         Passed - K in normal range and within 180 days    Potassium  Date Value Ref Range Status  06/21/2022 4.8 3.5 - 5.3 mmol/L Final         Passed - Patient is not pregnant

## 2022-12-08 DIAGNOSIS — E785 Hyperlipidemia, unspecified: Secondary | ICD-10-CM | POA: Diagnosis not present

## 2022-12-08 DIAGNOSIS — M1990 Unspecified osteoarthritis, unspecified site: Secondary | ICD-10-CM | POA: Diagnosis not present

## 2022-12-08 DIAGNOSIS — N182 Chronic kidney disease, stage 2 (mild): Secondary | ICD-10-CM | POA: Diagnosis not present

## 2022-12-08 DIAGNOSIS — E669 Obesity, unspecified: Secondary | ICD-10-CM | POA: Diagnosis not present

## 2022-12-08 DIAGNOSIS — E039 Hypothyroidism, unspecified: Secondary | ICD-10-CM | POA: Diagnosis not present

## 2022-12-08 DIAGNOSIS — G4733 Obstructive sleep apnea (adult) (pediatric): Secondary | ICD-10-CM | POA: Diagnosis not present

## 2022-12-08 DIAGNOSIS — M8580 Other specified disorders of bone density and structure, unspecified site: Secondary | ICD-10-CM | POA: Diagnosis not present

## 2022-12-08 DIAGNOSIS — I129 Hypertensive chronic kidney disease with stage 1 through stage 4 chronic kidney disease, or unspecified chronic kidney disease: Secondary | ICD-10-CM | POA: Diagnosis not present

## 2022-12-08 DIAGNOSIS — H269 Unspecified cataract: Secondary | ICD-10-CM | POA: Diagnosis not present

## 2022-12-17 ENCOUNTER — Other Ambulatory Visit: Payer: Self-pay | Admitting: Family Medicine

## 2022-12-17 DIAGNOSIS — M1712 Unilateral primary osteoarthritis, left knee: Secondary | ICD-10-CM | POA: Diagnosis not present

## 2022-12-17 DIAGNOSIS — M19072 Primary osteoarthritis, left ankle and foot: Secondary | ICD-10-CM | POA: Diagnosis not present

## 2022-12-17 NOTE — Telephone Encounter (Signed)
Requested Prescriptions  Pending Prescriptions Disp Refills   meloxicam (MOBIC) 15 MG tablet [Pharmacy Med Name: MELOXICAM 15MG  TABLETS] 90 tablet 3    Sig: TAKE 1 TABLET BY MOUTH EVERY DAY AS NEEDED FOR JOINT PAIN     Analgesics:  COX2 Inhibitors Failed - 12/17/2022  8:32 AM      Failed - Manual Review: Labs are only required if the patient has taken medication for more than 8 weeks.      Failed - Valid encounter within last 12 months    Recent Outpatient Visits           1 year ago Encounter for screening mammogram for malignant neoplasm of breast   [facility] Donita Brooks, MD   1 year ago Need for immunization against influenza   [facility] Pickard, Priscille Heidelberg, MD       Future Appointments             In 6 months Pickard, Priscille Heidelberg, MD [facility], [facility]            Passed - HGB in normal range and within 360 days    Hemoglobin  Date Value Ref Range Status  06/21/2022 12.1 11.7 - 15.5 g/dL Final         Passed - Cr in normal range and within 360 days    Creat  Date Value Ref Range Status  06/21/2022 0.76 0.60 - 1.00 mg/dL Final         Passed - HCT in normal range and within 360 days    HCT  Date Value Ref Range Status  06/21/2022 37.6 35.0 - 45.0 % Final         Passed - AST in normal range and within 360 days    AST  Date Value Ref Range Status  06/21/2022 15 10 - 35 U/L Final         Passed - ALT in normal range and within 360 days    ALT  Date Value Ref Range Status  06/21/2022 13 6 - 29 U/L Final         Passed - eGFR is 30 or above and within 360 days    GFR calc Af Amer  Date Value Ref Range Status  05/20/2018 >60 >60 mL/min Final   GFR, Estimated  Date Value Ref Range Status  04/30/2022 >60 >60 mL/min Final    Comment:    (NOTE) Calculated using the CKD-EPI Creatinine Equation (2021)    eGFR  Date Value Ref Range Status  06/21/2022 83 > OR = 60 mL/min/1.65m2 Final          Passed - Patient is not pregnant

## 2022-12-24 DIAGNOSIS — M1712 Unilateral primary osteoarthritis, left knee: Secondary | ICD-10-CM | POA: Diagnosis not present

## 2022-12-31 DIAGNOSIS — M1712 Unilateral primary osteoarthritis, left knee: Secondary | ICD-10-CM | POA: Diagnosis not present

## 2023-01-27 DIAGNOSIS — G4733 Obstructive sleep apnea (adult) (pediatric): Secondary | ICD-10-CM | POA: Diagnosis not present

## 2023-01-28 DIAGNOSIS — M1712 Unilateral primary osteoarthritis, left knee: Secondary | ICD-10-CM | POA: Diagnosis not present

## 2023-02-08 ENCOUNTER — Other Ambulatory Visit: Payer: Self-pay | Admitting: Family Medicine

## 2023-02-08 NOTE — Telephone Encounter (Signed)
Requested medications are due for refill today.  yes  Requested medications are on the active medications list.  yes  Last refill. 11/15/2022 90 day supply  Future visit scheduled.   yes  Notes to clinic.  It appears that this pt is just seen yearly. Last CPE was 06/29/2022 and pt has one scheduled for 06/2023. Please advise.    Requested Prescriptions  Pending Prescriptions Disp Refills   levothyroxine (SYNTHROID) 100 MCG tablet [Pharmacy Med Name: LEVOTHYROXINE 0.100MG  ( ) TAB] 90 tablet 0    Sig: TAKE 1 TABLET(100 MCG) BY MOUTH DAILY     Endocrinology:  Hypothyroid Agents Failed - 02/08/2023  6:13 AM      Failed - Valid encounter within last 12 months    Recent Outpatient Visits           1 year ago Encounter for screening mammogram for malignant neoplasm of breast   [facility] Pickard, Priscille Heidelberg, MD   1 year ago Need for immunization against influenza   [facility] Donita Brooks, MD       Future Appointments             In 4 months Pickard, Priscille Heidelberg, MD [place] [facility], [facility]            Passed - TSH in normal range and within 360 days    TSH  Date Value Ref Range Status  06/21/2022 1.36 0.40 - 4.50 mIU/L Final          amLODipine (NORVASC) 5 MG tablet [Pharmacy Med Name: AMLODIPINE BESYLATE 5MG  TABLETS] 180 tablet 0    Sig: TAKE 1 TABLET BY MOUTH TWICE DAILY     Cardiovascular: Calcium Channel Blockers 2 Failed - 02/08/2023  6:13 AM      Failed - Last BP in normal range    BP Readings from Last 1 Encounters:  09/22/22 (!) 152/70         Failed - Valid encounter within last 6 months    Recent Outpatient Visits           1 year ago Encounter for screening mammogram for malignant neoplasm of breast   [facility] Tanya Nones, Priscille Heidelberg, MD   1 year ago Need for immunization against influenza   [facility] Donita Brooks, MD       Future Appointments              In 4 months Pickard, Priscille Heidelberg, MD [place] [facility], [facility]            Passed - Last Heart Rate in normal range    Pulse Readings from Last 1 Encounters:  09/22/22 80          losartan (COZAAR) 100 MG tablet [Pharmacy Med Name: LOSARTAN 100MG  TABLETS] 90 tablet 0    Sig: TAKE 1 TABLET BY MOUTH DAILY     Cardiovascular:  Angiotensin Receptor Blockers Failed - 02/08/2023  6:13 AM      Failed - Cr in normal range and within 180 days    Creat  Date Value Ref Range Status  06/21/2022 0.76 0.60 - 1.00 mg/dL Final         Failed - K in normal range and within 180 days    Potassium  Date Value Ref Range Status  06/21/2022 4.8 3.5 - 5.3 mmol/L Final         Failed - Last  BP in normal range    BP Readings from Last 1 Encounters:  09/22/22 (!) 152/70         Failed - Valid encounter within last 6 months    Recent Outpatient Visits           1 year ago Encounter for screening mammogram for malignant neoplasm of breast   [facility] Tanya Nones, Priscille Heidelberg, MD   1 year ago Need for immunization against influenza   [facility] Pickard, Priscille Heidelberg, MD       Future Appointments             In 4 months Pickard, Priscille Heidelberg, MD [facility], [facility]            Passed - Patient is not pregnant

## 2023-02-11 NOTE — Telephone Encounter (Signed)
Pharmacy sent second refill request for  amLODipine (NORVASC) 5 MG tablet [[id number]]   levothyroxine (SYNTHROID) 100 MCG tablet [[id number]]   losartan (COZAAR) 100 MG tablet   LOV: 06/29/2022 (CPE)  Pharmacy:   [id number] - [place], [place] - [address] [facility] [address] [place], [place] [address] Phone: [phone]  Fax: [phone] DEA #: [id number]   Please advise pharmacist.

## 2023-03-02 ENCOUNTER — Ambulatory Visit: Payer: Medicare PPO

## 2023-03-02 DIAGNOSIS — Z23 Encounter for immunization: Secondary | ICD-10-CM

## 2023-03-02 DIAGNOSIS — G4733 Obstructive sleep apnea (adult) (pediatric): Secondary | ICD-10-CM | POA: Diagnosis not present

## 2023-03-02 NOTE — Progress Notes (Signed)
Pt came into today for high dose flu vaccine. Vaccine given on the l-upper arm. Pt tol injection w/no c/o per pt. Pt left ambulatory w/no c/o.

## 2023-03-30 ENCOUNTER — Ambulatory Visit: Payer: Self-pay | Admitting: Family Medicine

## 2023-03-30 DIAGNOSIS — M79672 Pain in left foot: Secondary | ICD-10-CM | POA: Diagnosis not present

## 2023-03-30 DIAGNOSIS — M19072 Primary osteoarthritis, left ankle and foot: Secondary | ICD-10-CM | POA: Diagnosis not present

## 2023-03-30 DIAGNOSIS — M19071 Primary osteoarthritis, right ankle and foot: Secondary | ICD-10-CM | POA: Diagnosis not present

## 2023-03-30 DIAGNOSIS — M79671 Pain in right foot: Secondary | ICD-10-CM | POA: Diagnosis not present

## 2023-03-30 NOTE — Telephone Encounter (Signed)
Copied from CRM [id number]. Topic: Clinical - Red Word Triage >> Mar 30, 2023  3:33 PM Fuller Mandril wrote: Red Word that prompted transfer to Nurse Triage: Pt calling in about legs ankles and feet swelling and would like to see Dr. Tanya Nones was told its not osteo arthritis   Chief Complaint: Bilateral feet/ankle/lower leg swelling Symptoms: Swelling Frequency: 10-14 days Pertinent Negatives: Patient denies shortness of breath, fever, drainage Disposition: [] ED /[] Urgent Care (no appt availability in office) / [] Appointment(In office/virtual)/ []  [place] [facility]/ [x] Home Care/ [] Refused Recommended Disposition /[] [facility]/ [x]  Follow-up with PCP Additional Notes: Patient went and saw her rheumatoid arthritis doctor to get shots in her feet today and he examined her and told her that she needed to follow up with her primary care about this. No appointments were available and patient requests in office visit for evaluation and treatment, She would like a call back to be made aware when she could be worked in. Home care advice given to patient and advised that the office will follow up.  Patient is advised that if anything changes or gets worse to call us back or go to the emergency room.  Reason for Disposition  MILD or MODERATE ankle swelling (e.g., can't move joint normally, can't do usual activities) (Exceptions: Itchy, localized swelling; swelling is chronic.)  Answer Assessment - Initial Assessment Questions 1. LOCATION: "Which ankle is swollen?" "Where is the swelling?"     Both ankles--Patient saw rheumatologist who advised to see pcp. Patient states rheumatologist pressed and left indentions in front of lower legs 2. ONSET: "When did the swelling start?"     10-14 days ago 3. SWELLING: "How bad is the swelling?" Or, "How large is it?" (e.g., mild, moderate, severe; size of localized swelling)    - NONE: No joint swelling.   - LOCALIZED: Localized; small area of puffy or  swollen skin (e.g., insect bite, skin irritation).   - MILD: Joint looks or feels mildly swollen or puffy.   - MODERATE: Swollen; interferes with normal activities (e.g., work or school); decreased range of movement; may be limping.   - SEVERE: Very swollen; can't move swollen joint at all; limping a lot or unable to walk.     It's hard for me to say but it's not normal but I wouldn't say severe 4. PAIN: "Is there any pain?" If Yes, ask: "How bad is it?" (Scale 1-10; or mild, moderate, severe)   - NONE (0): no pain.   - MILD (1-3): doesn't interfere with normal activities.    - MODERATE (4-7): interferes with normal activities (e.g., work or school) or awakens from sleep, limping.    - SEVERE (8-10): excruciating pain, unable to do any normal activities, unable to walk.      Denies any pain, redness, or drainage 5. CAUSE: "What do you think caused the ankle swelling?"     N/A 6. OTHER SYMPTOMS: "Do you have any other symptoms?" (e.g., fever, chest pain, difficulty breathing, calf pain)     Denies any other symptoms  Protocols used: Ankle Swelling-A-AH

## 2023-03-31 ENCOUNTER — Other Ambulatory Visit: Payer: Self-pay

## 2023-03-31 DIAGNOSIS — I10 Essential (primary) hypertension: Secondary | ICD-10-CM

## 2023-03-31 MED ORDER — HYDROCHLOROTHIAZIDE 25 MG PO TABS: 12.5000 mg | ORAL_TABLET | Freq: Every day | ORAL | 3 refills | Status: DC

## 2023-04-06 DIAGNOSIS — L7211 Pilar cyst: Secondary | ICD-10-CM | POA: Diagnosis not present

## 2023-04-06 DIAGNOSIS — L821 Other seborrheic keratosis: Secondary | ICD-10-CM | POA: Diagnosis not present

## 2023-04-06 DIAGNOSIS — D225 Melanocytic nevi of trunk: Secondary | ICD-10-CM | POA: Diagnosis not present

## 2023-04-06 DIAGNOSIS — L720 Epidermal cyst: Secondary | ICD-10-CM | POA: Diagnosis not present

## 2023-04-06 DIAGNOSIS — L812 Freckles: Secondary | ICD-10-CM | POA: Diagnosis not present

## 2023-04-06 DIAGNOSIS — D2262 Melanocytic nevi of left upper limb, including shoulder: Secondary | ICD-10-CM | POA: Diagnosis not present

## 2023-04-06 DIAGNOSIS — D2272 Melanocytic nevi of left lower limb, including hip: Secondary | ICD-10-CM | POA: Diagnosis not present

## 2023-04-06 DIAGNOSIS — L80 Vitiligo: Secondary | ICD-10-CM | POA: Diagnosis not present

## 2023-04-12 ENCOUNTER — Encounter: Payer: Self-pay | Admitting: Family Medicine

## 2023-04-12 ENCOUNTER — Ambulatory Visit: Payer: Medicare PPO | Admitting: Family Medicine

## 2023-04-12 VITALS — BP 146/74 | HR 76 | Temp 98.0°F | Ht 62.0 in | Wt 176.9 lb

## 2023-04-12 DIAGNOSIS — R0602 Shortness of breath: Secondary | ICD-10-CM | POA: Diagnosis not present

## 2023-04-12 NOTE — Progress Notes (Signed)
Subjective:    Patient ID: Jean Baldwin, female    DOB: [birth year], 72 y.o.   MRN: [id number]  HPI  Patient is a very sweet 72 year old Caucasian female with a history of hypertension.  She was previously on amlodipine.  Recently she noticed some swelling in her ankles.  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  She denies any chest pain.  We stopped amlodipine and replaced it with hydrochlorothiazide.  The swelling has improved dramatically although her blood pressure is elevated today.  Past Medical History:  Diagnosis Date   Arthritis    knees   Dental crowns present    High cholesterol    Hypertension    states under control with meds., has been on med > 20 yr.   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     aspirin 81 MG chewable tablet Chew 1 tablet (81 mg total) by mouth 2 (two) times daily. 35 tablet 0   atorvastatin (LIPITOR) 20 MG tablet TAKE 1 TABLET BY MOUTH EVERY DAY 90 tablet 3   Calcium Carb-Cholecalciferol (CALCIUM/VITAMIN D) 600-400 MG-UNIT TABS Take 2 tablets by mouth daily.     cetirizine (ZYRTEC) 10 MG tablet Take 10 mg by mouth daily.     hydrochlorothiazide  (HYDRODIURIL) 25 MG tablet Take 0.5 tablets (12.5 mg total) by mouth daily. 30 tablet 3   levothyroxine (SYNTHROID) 100 MCG tablet TAKE 1 TABLET(100 MCG) BY MOUTH DAILY 90 tablet 0   losartan (COZAAR) 100 MG tablet TAKE 1 TABLET BY MOUTH DAILY 90 tablet 0   meloxicam (MOBIC) 15 MG tablet TAKE 1 TABLET BY MOUTH EVERY DAY AS NEEDED FOR JOINT PAIN 90 tablet 2   vitamin C (ASCORBIC ACID) 500 MG tablet Take 500 mg by mouth daily.     zinc gluconate 50 MG tablet Take 50 mg by mouth daily.     No current facility-administered medications on file prior to visit.   Marland Kitchenall Social History   Socioeconomic History   Marital status: Married    Spouse name: Not on file   Number of children: Not on file   Years of education: Not on file   Highest education level: Not on file  Occupational History   Not on file  Tobacco Use   Smoking status: Never   Smokeless tobacco: Never  Vaping Use   Vaping status: Never Used  Substance and Sexual Activity   Alcohol use: No   Drug use: No   Sexual activity: Yes  Other Topics Concern   Not on file  Social History Narrative   Not on file   Social Determinants of Health   Financial Resource Strain:  Not on file  Food Insecurity: Not on file  Transportation Needs: Not on file  Physical Activity: Not on file  Stress: Not on file  Social Connections: Not on file  Intimate Partner Violence: Not on file     Review of Systems  All other systems reviewed and are negative.      Objective:   Physical Exam Constitutional:      Appearance: Normal appearance. She is normal weight.  Cardiovascular:     Rate and Rhythm: Normal rate and regular rhythm.     Pulses: Normal pulses.     Heart sounds: Normal heart sounds. No murmur heard.    No gallop.  Pulmonary:     Effort: Pulmonary effort is normal. No respiratory distress.     Breath sounds: Normal breath sounds. No stridor. No wheezing, rhonchi or rales.  Abdominal:     General: Bowel sounds are  normal.     Palpations: Abdomen is soft.  Musculoskeletal:     Right lower leg: No edema.     Left lower leg: No edema.  Neurological:     General: No focal deficit present.     Mental Status: She is alert and oriented to person, place, and time.           Assessment & Plan:  Shortness of breath - Plan: ECHOCARDIOGRAM COMPLETE I believe the swelling is likely due to a combination of amlodipine and varicose veins that she has.  It has resolved after we stop the amlodipine.  I asked the patient to check her blood pressure twice a day and notify me in 1 week.  If the blood pressure remains greater than 140/90, I plan to add Bystolic.  I will get an echocardiogram to rule out heart failure or diastolic dysfunction

## 2023-04-19 ENCOUNTER — Encounter: Payer: Self-pay | Admitting: Family Medicine

## 2023-04-20 ENCOUNTER — Other Ambulatory Visit: Payer: Self-pay

## 2023-04-20 DIAGNOSIS — I10 Essential (primary) hypertension: Secondary | ICD-10-CM

## 2023-04-20 MED ORDER — NEBIVOLOL HCL 10 MG PO TABS: 10.0000 mg | ORAL_TABLET | Freq: Every day | ORAL | 1 refills | Status: DC

## 2023-04-25 DIAGNOSIS — G4733 Obstructive sleep apnea (adult) (pediatric): Secondary | ICD-10-CM | POA: Diagnosis not present

## 2023-05-09 ENCOUNTER — Ambulatory Visit (HOSPITAL_BASED_OUTPATIENT_CLINIC_OR_DEPARTMENT_OTHER): Payer: Medicare PPO

## 2023-05-09 DIAGNOSIS — R0602 Shortness of breath: Secondary | ICD-10-CM

## 2023-05-09 LAB — ECHOCARDIOGRAM COMPLETE
Area-P 1/2: 3.27 cm2
S' Lateral: 2.11 cm

## 2023-05-10 ENCOUNTER — Telehealth: Payer: Self-pay

## 2023-05-10 NOTE — Telephone Encounter (Signed)
 Copied from CRM [id number]. Topic: Clinical - Medical Advice >> May 10, 2023 12:38 PM Victorino Dike T wrote: Reason for CRM: concerned about color of urine, it is dark but no symptoms of UTI, please call patient [phone]

## 2023-05-12 ENCOUNTER — Other Ambulatory Visit: Payer: Self-pay | Admitting: Family Medicine

## 2023-05-12 ENCOUNTER — Other Ambulatory Visit: Payer: Self-pay

## 2023-05-12 DIAGNOSIS — I10 Essential (primary) hypertension: Secondary | ICD-10-CM

## 2023-05-12 DIAGNOSIS — M1712 Unilateral primary osteoarthritis, left knee: Secondary | ICD-10-CM | POA: Diagnosis not present

## 2023-05-12 DIAGNOSIS — E039 Hypothyroidism, unspecified: Secondary | ICD-10-CM

## 2023-05-12 MED ORDER — LEVOTHYROXINE SODIUM 100 MCG PO TABS: 100.0000 ug | ORAL_TABLET | Freq: Every day | ORAL | 0 refills | Status: DC

## 2023-05-12 MED ORDER — LOSARTAN POTASSIUM 100 MG PO TABS: 100.0000 mg | ORAL_TABLET | Freq: Every day | ORAL | 0 refills | Status: DC

## 2023-05-13 DIAGNOSIS — G4733 Obstructive sleep apnea (adult) (pediatric): Secondary | ICD-10-CM | POA: Diagnosis not present

## 2023-05-21 ENCOUNTER — Emergency Department (HOSPITAL_BASED_OUTPATIENT_CLINIC_OR_DEPARTMENT_OTHER): Payer: Medicare PPO

## 2023-05-21 ENCOUNTER — Emergency Department (HOSPITAL_BASED_OUTPATIENT_CLINIC_OR_DEPARTMENT_OTHER)
Admission: EM | Admit: 2023-05-21 | Discharge: 2023-05-22 | Disposition: A | Discharge status: Home / Self Care | Payer: Medicare PPO | Attending: Emergency Medicine | Admitting: Emergency Medicine

## 2023-05-21 ENCOUNTER — Other Ambulatory Visit: Payer: Self-pay

## 2023-05-21 ENCOUNTER — Encounter (HOSPITAL_BASED_OUTPATIENT_CLINIC_OR_DEPARTMENT_OTHER): Payer: Self-pay | Admitting: *Deleted

## 2023-05-21 DIAGNOSIS — R519 Headache, unspecified: Secondary | ICD-10-CM | POA: Insufficient documentation

## 2023-05-21 DIAGNOSIS — Z79899 Other long term (current) drug therapy: Secondary | ICD-10-CM | POA: Insufficient documentation

## 2023-05-21 DIAGNOSIS — I10 Essential (primary) hypertension: Secondary | ICD-10-CM | POA: Insufficient documentation

## 2023-05-21 DIAGNOSIS — R109 Unspecified abdominal pain: Secondary | ICD-10-CM | POA: Diagnosis not present

## 2023-05-21 DIAGNOSIS — Z7982 Long term (current) use of aspirin: Secondary | ICD-10-CM | POA: Insufficient documentation

## 2023-05-21 DIAGNOSIS — N201 Calculus of ureter: Secondary | ICD-10-CM | POA: Diagnosis not present

## 2023-05-21 DIAGNOSIS — K449 Diaphragmatic hernia without obstruction or gangrene: Secondary | ICD-10-CM | POA: Diagnosis not present

## 2023-05-21 DIAGNOSIS — I160 Hypertensive urgency: Secondary | ICD-10-CM | POA: Diagnosis not present

## 2023-05-21 DIAGNOSIS — R319 Hematuria, unspecified: Secondary | ICD-10-CM | POA: Diagnosis present

## 2023-05-21 DIAGNOSIS — I6782 Cerebral ischemia: Secondary | ICD-10-CM | POA: Diagnosis not present

## 2023-05-21 DIAGNOSIS — I672 Cerebral atherosclerosis: Secondary | ICD-10-CM | POA: Diagnosis not present

## 2023-05-21 DIAGNOSIS — N200 Calculus of kidney: Secondary | ICD-10-CM

## 2023-05-21 DIAGNOSIS — N202 Calculus of kidney with calculus of ureter: Secondary | ICD-10-CM | POA: Diagnosis not present

## 2023-05-21 LAB — CBC
HCT: 38.8 % (ref 36.0–46.0)
Hemoglobin: 12.5 g/dL (ref 12.0–15.0)
MCH: 27.2 pg (ref 26.0–34.0)
MCHC: 32.2 g/dL (ref 30.0–36.0)
MCV: 84.3 fL (ref 80.0–100.0)
Platelets: 317 10*3/uL (ref 150–400)
RBC: 4.6 MIL/uL (ref 3.87–5.11)
RDW: 15.4 % (ref 11.5–15.5)
WBC: 11 10*3/uL — ABNORMAL HIGH (ref 4.0–10.5)
nRBC: 0 % (ref 0.0–0.2)

## 2023-05-21 LAB — BASIC METABOLIC PANEL
Anion gap: 8 (ref 5–15)
BUN: 25 mg/dL — ABNORMAL HIGH (ref 8–23)
CO2: 28 mmol/L (ref 22–32)
Calcium: 9.6 mg/dL (ref 8.9–10.3)
Chloride: 105 mmol/L (ref 98–111)
Creatinine, Ser: 1.12 mg/dL — ABNORMAL HIGH (ref 0.44–1.00)
GFR, Estimated: 52 mL/min — ABNORMAL LOW (ref >60)
Glucose, Bld: 107 mg/dL — ABNORMAL HIGH (ref 70–99)
Potassium: 3.9 mmol/L (ref 3.5–5.1)
Sodium: 141 mmol/L (ref 135–145)

## 2023-05-21 MED ORDER — CLONIDINE HCL 0.1 MG PO TABS
0.1000 mg | ORAL_TABLET | Freq: Once | ORAL | Status: AC
  Administered 2023-05-22: 0.1 mg via ORAL
  Filled 2023-05-21: qty 1

## 2023-05-21 NOTE — ED Provider Notes (Signed)
 [place] EMERGENCY DEPARTMENT AT [facility] Provider Note   CSN: [id number] Arrival date & time: 05/21/23  2220     History  No chief complaint on file.   Jean Baldwin is a 73 y.o. female.  Patient is a 73 year old female with past medical history of hypertension, hyperlipidemia, GERD.  Patient presenting today with complaints of elevated blood pressure.  Yesterday evening she began getting readings in the 170s, then this evening her blood pressure was over 200 systolic, so presents for evaluation of this.  She describes a very mild headache, but has no other complaints otherwise.  She reports an episode several days ago where she did notice blood in her urine, but denies any dysuria or pain.  This has happened 1 time since.  No fevers or chills.  No abdominal or flank pain.  She has had kidney stones in the past, but never had gross hematuria with those.  She does describe recent changes in her blood pressure medication.  The history is provided by the patient.       Home Medications Prior to Admission medications   Medication Sig Start Date End Date Taking? Authorizing Provider  Apple Cider Vinegar 600 MG CAPS Take by mouth.    [provider]  aspirin  81 MG chewable tablet Chew 1 tablet (81 mg total) by mouth 2 (two) times daily. 07/14/17   Gretta Bertrum ORN, PA-C  atorvastatin  (LIPITOR) 20 MG tablet TAKE 1 TABLET BY MOUTH EVERY DAY 08/16/22   Duanne Butler DASEN, MD  Calcium  Carb-Cholecalciferol (CALCIUM /VITAMIN D) 600-400 MG-UNIT TABS Take 2 tablets by mouth daily.    [provider]  cetirizine (ZYRTEC) 10 MG tablet Take 10 mg by mouth daily.    [provider]  hydrochlorothiazide  (HYDRODIURIL ) 25 MG tablet Take 0.5 tablets (12.5 mg total) by mouth daily. 03/31/23   Duanne Butler DASEN, MD  levothyroxine  (SYNTHROID ) 100 MCG tablet Take 1 tablet (100 mcg total) by mouth daily before breakfast. 05/12/23   Duanne Butler DASEN, MD  losartan  (COZAAR )  100 MG tablet Take 1 tablet (100 mg total) by mouth daily. 05/12/23   Duanne Butler DASEN, MD  meloxicam (MOBIC) 15 MG tablet TAKE 1 TABLET BY MOUTH EVERY DAY AS NEEDED FOR JOINT PAIN 12/17/22   Duanne Butler DASEN, MD  nebivolol  (BYSTOLIC ) 10 MG tablet Take 1 tablet (10 mg total) by mouth daily. 04/20/23   Duanne Butler DASEN, MD  vitamin C  (ASCORBIC ACID ) 500 MG tablet Take 500 mg by mouth daily.    [provider]  zinc gluconate 50 MG tablet Take 50 mg by mouth daily.    [provider]      Allergies    Sulfamethoxazole, Codeine, and Sulfa antibiotics    Review of Systems   Review of Systems  All other systems reviewed and are negative.   Physical Exam Updated Vital Signs BP (!) 176/68   Pulse 64   Temp 98.3 F (36.8 C) (Oral)   Resp 16   SpO2 97%  Physical Exam Vitals and nursing note reviewed.  Constitutional:      General: She is not in acute distress.    Appearance: She is well-developed. She is not diaphoretic.  HENT:     Head: Normocephalic and atraumatic.  Cardiovascular:     Rate and Rhythm: Normal rate and regular rhythm.     Heart sounds: No murmur heard.    No friction rub. No gallop.  Pulmonary:     Effort: Pulmonary  effort is normal. No respiratory distress.     Breath sounds: Normal breath sounds. No wheezing.  Abdominal:     General: Bowel sounds are normal. There is no distension.     Palpations: Abdomen is soft.     Tenderness: There is no abdominal tenderness.  Musculoskeletal:        General: Normal range of motion.     Cervical back: Normal range of motion and neck supple.  Skin:    General: Skin is warm and dry.  Neurological:     General: No focal deficit present.     Mental Status: She is alert and oriented to person, place, and time.     ED Results / Procedures / Treatments   Labs (all labs ordered are listed, but only abnormal results are displayed) Labs Reviewed  CBC - Abnormal; Notable for the following components:       Result Value   WBC 11.0 (*)    All other components within normal limits  BASIC METABOLIC PANEL - Abnormal; Notable for the following components:   Glucose, Bld 107 (*)    BUN 25 (*)    Creatinine, Ser 1.12 (*)    GFR, Estimated 52 (*)    All other components within normal limits  URINALYSIS, ROUTINE W REFLEX MICROSCOPIC    EKG EKG Interpretation Date/Time:  Saturday May 21 2023 22:57:59 EST Ventricular Rate:  60 PR Interval:  152 QRS Duration:  80 QT Interval:  434 QTC Calculation: 434 R Axis:   82  Text Interpretation: Normal sinus rhythm Normal ECG When compared with ECG of 20-May-2018 18:59, no significant change is noted Confirmed by Geroldine Berg ([id number]) on 05/21/2023 11:33:13 PM  Radiology No results found.  Procedures Procedures    Medications Ordered in ED Medications - No data to display  ED Course/ Medical Decision Making/ A&P  Patient is a 73 year old female presenting with complaints of headache and elevated blood pressure.  She has been taking her blood pressure for the last 2 days and has gotten readings over 200.  She also reports an episode of hematuria 2 days ago, but denies any abdominal or flank pain.  Patient arrives here with stable vital signs and is afebrile.  Physical examination basically unremarkable.  Abdomen is benign.  Patient is neurologically intact.  Laboratory studies obtained including CBC, metabolic panel, both of which are basically unremarkable.  Urinalysis shows trace leukocytes but is otherwise unremarkable.  CT scan of the head obtained showing no acute process.  CT scan with renal protocol does show a 3 mm stone in the right proximal ureter.  Patient has been given a dose of clonidine  here in the ER and blood pressure is now 162/61.  She seems to be feeling better.  I did obtain the renal CT due to the patient's reported hematuria showing a 3 mm stone in the proximal ureter.  Perhaps this is contributing to her blood pressure  elevation.  Patient will be prescribed medication for pain as I suspect this will soon cause her some discomfort.  She has Flomax  at home and will take this as previously directed.  Final Clinical Impression(s) / ED Diagnoses Final diagnoses:  None    Rx / DC Orders ED Discharge Orders     None         Geroldine Berg, MD 05/22/23 [phone]

## 2023-05-21 NOTE — ED Notes (Signed)
 Pt transported to CT via wheelchair.

## 2023-05-21 NOTE — ED Triage Notes (Signed)
  Pt was started on Nebivolol  within the month, due for a blood pressure check coming up. Started taking a log yesterday and noted readings: Yesterday 180/68  1700--206/80 2100-- 218/80 Within the hour, started having headache, denies blurred vision or dizziness.   Also reports two episodes of blood in urine which have recently cleared up

## 2023-05-22 LAB — URINALYSIS, ROUTINE W REFLEX MICROSCOPIC
Bacteria, UA: NONE SEEN
Bilirubin Urine: NEGATIVE
Glucose, UA: NEGATIVE mg/dL
Hgb urine dipstick: NEGATIVE
Ketones, ur: NEGATIVE mg/dL
Nitrite: NEGATIVE
Specific Gravity, Urine: 1.024 (ref 1.005–1.030)
pH: 7 (ref 5.0–8.0)

## 2023-05-22 MED ORDER — HYDROCODONE-ACETAMINOPHEN 5-325 MG PO TABS: 1.0000 | ORAL_TABLET | Freq: Four times a day (QID) | ORAL | 0 refills | Status: DC | PRN

## 2023-05-22 MED ORDER — TAMSULOSIN HCL 0.4 MG PO CAPS: 0.4000 mg | ORAL_CAPSULE | Freq: Every day | ORAL | 0 refills | Status: DC

## 2023-05-22 MED ORDER — CLONIDINE HCL 0.1 MG PO TABS: 0.1000 mg | ORAL_TABLET | Freq: Three times a day (TID) | ORAL | 0 refills | Status: DC | PRN

## 2023-05-22 NOTE — Discharge Instructions (Addendum)
 Begin taking Flomax  as prescribed.  Begin taking hydrocodone  as prescribed as needed for pain.  If blood pressures continue to run greater than 180, take clonidine  as prescribed this evening.  Keep a record of your blood pressures and follow-up with your primary doctor in the next 1 to 2 weeks.

## 2023-05-26 ENCOUNTER — Ambulatory Visit: Payer: Medicare PPO | Admitting: Family Medicine

## 2023-05-26 ENCOUNTER — Encounter: Payer: Self-pay | Admitting: Family Medicine

## 2023-05-26 VITALS — BP 164/86 | HR 65 | Temp 98.6°F | Ht 62.0 in | Wt 185.0 lb

## 2023-05-26 DIAGNOSIS — I10 Essential (primary) hypertension: Secondary | ICD-10-CM | POA: Diagnosis not present

## 2023-05-26 NOTE — Progress Notes (Signed)
Subjective:    Patient ID: Jean Baldwin, female    DOB: 01-23-1951, 73 y.o.   MRN: [id number]  Hypertension    Patient is a very sweet 73 year old Caucasian female with a history of hypertension.  Despite taking nebivolol 10 mg a day, hydrochlorothiazide 12.5 mg a day, and losartan 100 mg a day, the patient's blood pressure has been averaging between 150 and 180 systolic.  She recently went to the hospital and was given clonidine to take when her systolic blood pressure was over 180.  CT scan done at that time also showed a right ureteral stone 3 mm in size.  Patient is currently on Flomax.  Otherwise she is asymptomatic Past Medical History:  Diagnosis Date   Arthritis    knees   Dental crowns present    High cholesterol    Hypertension    states under control with meds., has been on med > 20 yr.   Hypothyroidism    Osteopenia    TFCC (triangular fibrocartilage complex) tear 03/2017   right wrist/wrist   Past Surgical History:  Procedure Laterality Date   KNEE ARTHROSCOPY W/ MENISCAL REPAIR Left    NASAL SEPTUM SURGERY     TOTAL HIP ARTHROPLASTY Right 07/12/2017   Procedure: RIGHT TOTAL HIP ARTHROPLASTY ANTERIOR APPROACH;  Surgeon: Kathryne Hitch, MD;  Location: [facility];  Service: Orthopedics;  Laterality: Right;   TUBAL LIGATION     WRIST ARTHROSCOPY WITH DEBRIDEMENT Right 04/12/2017   Procedure: RIGHT WRIST ARTHROSCOPY WITH DEBRIDEMENT;  Surgeon: Cindee Salt, MD;  Location: [facility];  Service: Orthopedics;  Laterality: Right;   Current Outpatient Medications on File Prior to Visit  Medication Sig Dispense Refill   Apple Cider Vinegar 600 MG CAPS Take by mouth.     aspirin 81 MG chewable tablet Chew 1 tablet (81 mg total) by mouth 2 (two) times daily. 35 tablet 0   atorvastatin (LIPITOR) 20 MG tablet TAKE 1 TABLET BY MOUTH EVERY DAY 90 tablet 3   Calcium Carb-Cholecalciferol (CALCIUM/VITAMIN D) 600-400 MG-UNIT TABS Take 2 tablets by mouth daily.      cetirizine (ZYRTEC) 10 MG tablet Take 10 mg by mouth daily.     cloNIDine (CATAPRES) 0.1 MG tablet Take 1 tablet (0.1 mg total) by mouth 3 (three) times daily as needed. 20 tablet 0   hydrochlorothiazide (HYDRODIURIL) 25 MG tablet Take 0.5 tablets (12.5 mg total) by mouth daily. 30 tablet 3   levothyroxine (SYNTHROID) 100 MCG tablet Take 1 tablet (100 mcg total) by mouth daily before breakfast. 90 tablet 0   losartan (COZAAR) 100 MG tablet Take 1 tablet (100 mg total) by mouth daily. 90 tablet 0   meloxicam (MOBIC) 15 MG tablet TAKE 1 TABLET BY MOUTH EVERY DAY AS NEEDED FOR JOINT PAIN 90 tablet 2   nebivolol (BYSTOLIC) 10 MG tablet Take 1 tablet (10 mg total) by mouth daily. 30 tablet 1   tamsulosin (FLOMAX) 0.4 MG CAPS capsule Take 1 capsule (0.4 mg total) by mouth daily. 15 capsule 0   vitamin C (ASCORBIC ACID) 500 MG tablet Take 500 mg by mouth daily.     zinc gluconate 50 MG tablet Take 50 mg by mouth daily.     HYDROcodone-acetaminophen (NORCO) 5-325 MG tablet Take 1-2 tablets by mouth every 6 (six) hours as needed. (Patient not taking: Reported on 05/26/2023) 15 tablet 0   No current facility-administered medications on file prior to visit.   Marland Kitchenall Social History   Socioeconomic  History   Marital status: Married    Spouse name: Not on file   Number of children: Not on file   Years of education: Not on file   Highest education level: Not on file  Occupational History   Not on file  Tobacco Use   Smoking status: Never   Smokeless tobacco: Never  Vaping Use   Vaping status: Never Used  Substance and Sexual Activity   Alcohol use: No   Drug use: No   Sexual activity: Yes  Other Topics Concern   Not on file  Social History Narrative   Not on file   Social Drivers of Health   Financial Resource Strain: Not on file  Food Insecurity: Not on file  Transportation Needs: Not on file  Physical Activity: Not on file  Stress: Not on file  Social Connections: Not on file  Intimate  Partner Violence: Not on file     Review of Systems  All other systems reviewed and are negative.      Objective:   Physical Exam Constitutional:      Appearance: Normal appearance. She is normal weight.  Cardiovascular:     Rate and Rhythm: Normal rate and regular rhythm.     Pulses: Normal pulses.     Heart sounds: Normal heart sounds. No murmur heard.    No gallop.  Pulmonary:     Effort: Pulmonary effort is normal. No respiratory distress.     Breath sounds: Normal breath sounds. No stridor. No wheezing, rhonchi or rales.  Abdominal:     General: Bowel sounds are normal.     Palpations: Abdomen is soft.  Musculoskeletal:     Right lower leg: No edema.     Left lower leg: No edema.  Neurological:     General: No focal deficit present.     Mental Status: She is alert and oriented to person, place, and time.           Assessment & Plan:  Benign essential HTN Continue losartan 100 mg a day, increase nebivolol to 20 mg a day.  Increase hydrochlorothiazide to 25 mg a day.  Recheck blood pressure in 2 weeks.  If persistently elevated add doxazosin.  If persistently elevated workup causes of secondary hypertension including renal artery stenosis, etc.

## 2023-05-30 ENCOUNTER — Encounter: Payer: Self-pay | Admitting: Family Medicine

## 2023-05-30 ENCOUNTER — Other Ambulatory Visit: Payer: Self-pay | Admitting: Family Medicine

## 2023-05-30 DIAGNOSIS — I10 Essential (primary) hypertension: Secondary | ICD-10-CM

## 2023-05-30 MED ORDER — SPIRONOLACTONE 25 MG PO TABS: 25.0000 mg | ORAL_TABLET | Freq: Every day | ORAL | 3 refills | Status: DC

## 2023-06-01 ENCOUNTER — Ambulatory Visit
Admission: RE | Admit: 2023-06-01 | Discharge: 2023-06-01 | Disposition: A | Discharge status: Home / Self Care | Payer: Medicare PPO | Source: Ambulatory Visit | Attending: Family Medicine | Admitting: Family Medicine

## 2023-06-01 DIAGNOSIS — I10 Essential (primary) hypertension: Secondary | ICD-10-CM | POA: Diagnosis not present

## 2023-06-06 ENCOUNTER — Other Ambulatory Visit: Payer: Self-pay | Admitting: Family Medicine

## 2023-06-06 DIAGNOSIS — I10 Essential (primary) hypertension: Secondary | ICD-10-CM

## 2023-06-06 MED ORDER — NEBIVOLOL HCL 10 MG PO TABS: 10.0000 mg | ORAL_TABLET | Freq: Every day | ORAL | 1 refills | Status: DC

## 2023-06-06 NOTE — Telephone Encounter (Signed)
Copied from CRM [id number]. Topic: Clinical - Medication Refill >> Jun 06, 2023  3:24 PM Payton Doughty wrote: Most Recent Primary Care Visit:  Provider: Lynnea Ferrier T  Department: [facility]  Visit Type: OFFICE VISIT  Date: 05/26/2023  Medication: nebivolol (BYSTOLIC) 20 MG tablet  Has the patient contacted their pharmacy? Yes Pt saw Dr Tanya Nones about 2 weeks ago, and he increased this med to 20 MG.  Pt had been taking 2 /10 MG, now is out of med.  Pt only wants about 15 pills, she has appt next week w/ the dr, and does no think she is going to stay on thi med.  She does not think its working. Pt s not symptomatic.  Is this the correct pharmacy for this prescription? Yes If no, delete pharmacy and type the correct one.  This is the patient's preferred pharmacy:  [id number] - [place], [place] - [address] [place] [address] Phone: [phone] Fax: [phone]   Has the prescription been filled recently? No  Is the patient out of the medication? Yes  Has the patient been seen for an appointment in the last year OR does the patient have an upcoming appointment? Yes  Can we respond through MyChart? Yes  Agent: Please be advised that Rx refills may take up to 3 business days. We ask that you follow-up with your pharmacy.

## 2023-06-09 DIAGNOSIS — E6609 Other obesity due to excess calories: Secondary | ICD-10-CM | POA: Diagnosis not present

## 2023-06-09 DIAGNOSIS — E782 Mixed hyperlipidemia: Secondary | ICD-10-CM | POA: Diagnosis not present

## 2023-06-09 DIAGNOSIS — I10 Essential (primary) hypertension: Secondary | ICD-10-CM | POA: Diagnosis not present

## 2023-06-09 DIAGNOSIS — M159 Polyosteoarthritis, unspecified: Secondary | ICD-10-CM | POA: Diagnosis not present

## 2023-06-09 DIAGNOSIS — F322 Major depressive disorder, single episode, severe without psychotic features: Secondary | ICD-10-CM | POA: Diagnosis not present

## 2023-06-09 DIAGNOSIS — E66811 Obesity, class 1: Secondary | ICD-10-CM | POA: Diagnosis not present

## 2023-06-09 DIAGNOSIS — E039 Hypothyroidism, unspecified: Secondary | ICD-10-CM | POA: Diagnosis not present

## 2023-06-09 DIAGNOSIS — Z6832 Body mass index (BMI) 32.0-32.9, adult: Secondary | ICD-10-CM | POA: Diagnosis not present

## 2023-06-09 DIAGNOSIS — G4733 Obstructive sleep apnea (adult) (pediatric): Secondary | ICD-10-CM | POA: Diagnosis not present

## 2023-06-13 ENCOUNTER — Ambulatory Visit: Payer: Medicare PPO | Admitting: Family Medicine

## 2023-06-13 VITALS — BP 142/76 | HR 70 | Ht 62.0 in | Wt 182.4 lb

## 2023-06-13 DIAGNOSIS — I1A0 Resistant hypertension: Secondary | ICD-10-CM | POA: Diagnosis not present

## 2023-06-13 DIAGNOSIS — N201 Calculus of ureter: Secondary | ICD-10-CM

## 2023-06-13 MED ORDER — CLONIDINE HCL 0.1 MG PO TABS: 0.1000 mg | ORAL_TABLET | Freq: Three times a day (TID) | ORAL | 11 refills | Status: DC

## 2023-06-13 NOTE — Progress Notes (Addendum)
Subjective:    Patient ID: Jean Baldwin, female    DOB: October 22, 1950, 73 y.o.   MRN: [id number]  Hypertension    Patient is a very sweet 73 year old Caucasian female with a history of hypertension.  Patient is currently taking nebivolol 20 mg a day, losartan 100 mg a day, hydrochlorothiazide 25 mg a day, and spironolactone 25 mg a day.  Since starting spironolactone her blood pressures have not improved.  Systolic blood pressure is averaging between 170 and 200.  This is on a daily basis.  She denies any chest pain or shortness of breath.  I did perform a renal ultrasound to evaluate for renal artery stenosis and this was negative.  The patient is already on a CPAP machine for obstructive sleep apnea.  Of note, the patient states that she has had a kidney stone/3 mm/in the right ureter now for almost 2 months.  She would like a referral to urology.  Patient was originally diagnosed with a kidney stone in her right ureter in November.  It was confirmed again on the CT scan in January.  She recently saw again on an ultrasound performed of the kidney now in February Past Medical History:  Diagnosis Date   Arthritis    knees   Dental crowns present    High cholesterol    Hypertension    states under control with meds., has been on med > 20 yr.   Hypothyroidism    Osteopenia    TFCC (triangular fibrocartilage complex) tear 03/2017   right wrist/wrist   Past Surgical History:  Procedure Laterality Date   KNEE ARTHROSCOPY W/ MENISCAL REPAIR Left    NASAL SEPTUM SURGERY     TOTAL HIP ARTHROPLASTY Right 07/12/2017   Procedure: RIGHT TOTAL HIP ARTHROPLASTY ANTERIOR APPROACH;  Surgeon: Kathryne Hitch, MD;  Location: [facility];  Service: Orthopedics;  Laterality: Right;   TUBAL LIGATION     WRIST ARTHROSCOPY WITH DEBRIDEMENT Right 04/12/2017   Procedure: RIGHT WRIST ARTHROSCOPY WITH DEBRIDEMENT;  Surgeon: Cindee Salt, MD;  Location: [facility];  Service: Orthopedics;   Laterality: Right;   Current Outpatient Medications on File Prior to Visit  Medication Sig Dispense Refill   aspirin 81 MG chewable tablet Chew 1 tablet (81 mg total) by mouth 2 (two) times daily. 35 tablet 0   atorvastatin (LIPITOR) 20 MG tablet TAKE 1 TABLET BY MOUTH EVERY DAY 90 tablet 3   Calcium Carb-Cholecalciferol (CALCIUM/VITAMIN D) 600-400 MG-UNIT TABS Take 2 tablets by mouth daily.     cetirizine (ZYRTEC) 10 MG tablet Take 10 mg by mouth daily.     cloNIDine (CATAPRES) 0.1 MG tablet Take 1 tablet (0.1 mg total) by mouth 3 (three) times daily as needed. 20 tablet 0   hydrochlorothiazide (HYDRODIURIL) 25 MG tablet Take 0.5 tablets (12.5 mg total) by mouth daily. 30 tablet 3   levothyroxine (SYNTHROID) 100 MCG tablet Take 1 tablet (100 mcg total) by mouth daily before breakfast. 90 tablet 0   losartan (COZAAR) 100 MG tablet Take 1 tablet (100 mg total) by mouth daily. 90 tablet 0   meloxicam (MOBIC) 15 MG tablet TAKE 1 TABLET BY MOUTH EVERY DAY AS NEEDED FOR JOINT PAIN 90 tablet 2   nebivolol (BYSTOLIC) 10 MG tablet Take 1 tablet (10 mg total) by mouth daily. 30 tablet 1   spironolactone (ALDACTONE) 25 MG tablet Take 1 tablet (25 mg total) by mouth daily. 90 tablet 3   tamsulosin (FLOMAX) 0.4 MG CAPS capsule  Take 1 capsule (0.4 mg total) by mouth daily. 15 capsule 0   vitamin C (ASCORBIC ACID) 500 MG tablet Take 500 mg by mouth daily.     zinc gluconate 50 MG tablet Take 50 mg by mouth daily.     Apple Cider Vinegar 600 MG CAPS Take by mouth. (Patient not taking: Reported on 06/13/2023)     HYDROcodone-acetaminophen (NORCO) 5-325 MG tablet Take 1-2 tablets by mouth every 6 (six) hours as needed. (Patient not taking: Reported on 06/13/2023) 15 tablet 0   No current facility-administered medications on file prior to visit.   Marland Kitchenall Social History   Socioeconomic History   Marital status: Married    Spouse name: Not on file   Number of children: Not on file   Years of education: Not on  file   Highest education level: 12th grade  Occupational History   Not on file  Tobacco Use   Smoking status: Never   Smokeless tobacco: Never  Vaping Use   Vaping status: Never Used  Substance and Sexual Activity   Alcohol use: No   Drug use: No   Sexual activity: Yes  Other Topics Concern   Not on file  Social History Narrative   Not on file   Social Drivers of Health   Financial Resource Strain: Low Risk  (06/13/2023)   Overall Financial Resource Strain (CARDIA)    Difficulty of Paying Living Expenses: Not hard at all  Food Insecurity: No Food Insecurity (06/13/2023)   Hunger Vital Sign    Worried About Running Out of Food in the Last Year: Never true    Ran Out of Food in the Last Year: Never true  Transportation Needs: No Transportation Needs (06/13/2023)   PRAPARE - Administrator, Civil Service (Medical): No    Lack of Transportation (Non-Medical): No  Physical Activity: Unknown (06/13/2023)   Exercise Vital Sign    Days of Exercise per Week: 0 days    Minutes of Exercise per Session: Not on file  Stress: No Stress Concern Present (06/13/2023)   [employer] of Occupational Health - Occupational Stress Questionnaire    Feeling of Stress : Only a little  Social Connections: Socially Integrated (06/13/2023)   Social Connection and Isolation Panel [NHANES]    Frequency of Communication with Friends and Family: More than three times a week    Frequency of Social Gatherings with Friends and Family: More than three times a week    Attends Religious Services: More than 4 times per year    Active Member of [employer] or Organizations: Yes    Attends Engineer, structural: More than 4 times per year    Marital Status: Married  Catering manager Violence: Not on file     Review of Systems  All other systems reviewed and are negative.      Objective:   Physical Exam Constitutional:      Appearance: Normal appearance. She is normal weight.  Cardiovascular:      Rate and Rhythm: Normal rate and regular rhythm.     Pulses: Normal pulses.     Heart sounds: Normal heart sounds. No murmur heard.    No gallop.  Pulmonary:     Effort: Pulmonary effort is normal. No respiratory distress.     Breath sounds: Normal breath sounds. No stridor. No wheezing, rhonchi or rales.  Abdominal:     General: Bowel sounds are normal.     Palpations: Abdomen is soft.  Musculoskeletal:  Right lower leg: No edema.     Left lower leg: No edema.  Neurological:     General: No focal deficit present.     Mental Status: She is alert and oriented to person, place, and time.           Assessment & Plan:  Resistant hypertension - Plan: Metanephrines, urine, 24 hour, Aldosterone + renin activity w/ ratio, COMPLETE METABOLIC PANEL WITH GFR  Right ureteral stone - Plan: Ambulatory referral to Urology  We have rule the patient out for renal artery stenosis and obstructive sleep apnea.  Check 24-hour urine metanephrines to rule out a pheochromocytoma.  Also check plasma renin aldosterone activity to rule out Conn syndrome/hyperaldosteronism however I feel that this is unlikely given her lack of response to spironolactone.  Discontinue spironolactone and replace with clonidine 0.1 mg p.o. 3 times daily and recheck in 1 week.  Consider the hypertension clinic if not responsive to this.  Meanwhile consult urology for the 3 mm right ureteral stone that has been present for almost 3 months

## 2023-06-15 ENCOUNTER — Other Ambulatory Visit: Payer: Medicare PPO

## 2023-06-15 DIAGNOSIS — I1A0 Resistant hypertension: Secondary | ICD-10-CM

## 2023-06-16 ENCOUNTER — Encounter: Payer: Self-pay | Admitting: Family Medicine

## 2023-06-16 DIAGNOSIS — L7211 Pilar cyst: Secondary | ICD-10-CM | POA: Diagnosis not present

## 2023-06-16 DIAGNOSIS — L720 Epidermal cyst: Secondary | ICD-10-CM | POA: Diagnosis not present

## 2023-06-16 LAB — COMPREHENSIVE METABOLIC PANEL
AG Ratio: 2.1 (calc) (ref 1.0–2.5)
ALT: 10 U/L (ref 6–29)
AST: 13 U/L (ref 10–35)
Albumin: 4.1 g/dL (ref 3.6–5.1)
Alkaline phosphatase (APISO): 103 U/L (ref 37–153)
BUN/Creatinine Ratio: 34 (calc) — ABNORMAL HIGH (ref 6–22)
BUN: 43 mg/dL — ABNORMAL HIGH (ref 7–25)
CO2: 26 mmol/L (ref 20–32)
Calcium: 10 mg/dL (ref 8.6–10.4)
Chloride: 105 mmol/L (ref 98–110)
Creat: 1.26 mg/dL — ABNORMAL HIGH (ref 0.60–1.00)
Globulin: 2 g/dL (ref 1.9–3.7)
Glucose, Bld: 113 mg/dL — ABNORMAL HIGH (ref 65–99)
Potassium: 4.8 mmol/L (ref 3.5–5.3)
Sodium: 140 mmol/L (ref 135–146)
Total Bilirubin: 0.6 mg/dL (ref 0.2–1.2)
Total Protein: 6.1 g/dL (ref 6.1–8.1)

## 2023-06-19 LAB — ALDOSTERONE + RENIN ACTIVITY W/ RATIO
ALDO / PRA Ratio: 4.5 {ratio} (ref 0.9–28.9)
Aldosterone: 3 ng/dL
Renin Activity: 0.67 ng/mL/h (ref 0.25–5.82)

## 2023-06-19 LAB — COMPLETE METABOLIC PANEL WITH GFR

## 2023-06-19 LAB — METANEPHRINES, URINE, 24 HOUR
METANEPHRINE: 116 ug/(24.h) (ref 90–315)
METANEPHRINES, TOTAL: 556 ug/(24.h) (ref 224–832)
NORMETANEPHRINE: 440 ug/(24.h) (ref 122–676)
Total Volume: 1500 mL

## 2023-06-19 LAB — TIQ-NTM

## 2023-06-23 DIAGNOSIS — I10 Essential (primary) hypertension: Secondary | ICD-10-CM | POA: Diagnosis not present

## 2023-06-23 DIAGNOSIS — E66811 Obesity, class 1: Secondary | ICD-10-CM | POA: Diagnosis not present

## 2023-06-23 DIAGNOSIS — G4733 Obstructive sleep apnea (adult) (pediatric): Secondary | ICD-10-CM | POA: Diagnosis not present

## 2023-06-23 DIAGNOSIS — E782 Mixed hyperlipidemia: Secondary | ICD-10-CM | POA: Diagnosis not present

## 2023-06-23 DIAGNOSIS — Z6832 Body mass index (BMI) 32.0-32.9, adult: Secondary | ICD-10-CM | POA: Diagnosis not present

## 2023-06-23 DIAGNOSIS — E6609 Other obesity due to excess calories: Secondary | ICD-10-CM | POA: Diagnosis not present

## 2023-06-23 DIAGNOSIS — M159 Polyosteoarthritis, unspecified: Secondary | ICD-10-CM | POA: Diagnosis not present

## 2023-06-23 DIAGNOSIS — E039 Hypothyroidism, unspecified: Secondary | ICD-10-CM | POA: Diagnosis not present

## 2023-06-23 DIAGNOSIS — F322 Major depressive disorder, single episode, severe without psychotic features: Secondary | ICD-10-CM | POA: Diagnosis not present

## 2023-06-27 ENCOUNTER — Other Ambulatory Visit: Payer: Self-pay | Admitting: Family Medicine

## 2023-06-27 ENCOUNTER — Other Ambulatory Visit: Payer: Self-pay

## 2023-06-27 DIAGNOSIS — I10 Essential (primary) hypertension: Secondary | ICD-10-CM

## 2023-06-27 MED ORDER — HYDROCHLOROTHIAZIDE 25 MG PO TABS: 25.0000 mg | ORAL_TABLET | Freq: Every day | ORAL | 3 refills | Status: DC

## 2023-06-27 NOTE — Telephone Encounter (Signed)
 Called pharmacy to verify receipt confirmed 06/27/23 at 1004 am. Pharmacy staff reports receipt confirmed but patient has not picked up Rx yet.

## 2023-06-27 NOTE — Telephone Encounter (Unsigned)
 Copied from CRM [id number]. Topic: Clinical - Medication Refill >> Jun 27, 2023 10:05 AM Higinio Roger wrote: Most Recent Primary Care Visit:  Provider: [facility]  Department: [facility]  Visit Type: LAB  Date: 06/15/2023  Medication: hydrochlorothiazide (HYDRODIURIL) 25 MG tablet  Has the patient contacted their pharmacy? No (Agent: If no, request that the patient contact the pharmacy for the refill. If patient does not wish to contact the pharmacy document the reason why and proceed with request.)  Patient is confused how it works for refills. She also stated Dr. Tanya Nones increased the medication from a half pill to whole pill and will need early refill.  (Agent: If yes, when and what did the pharmacy advise?)  Is this the correct pharmacy for this prescription? Yes If no, delete pharmacy and type the correct one.  This is the patient's preferred pharmacy:  [id number] - [place], [place] - [address] [place] [address] Phone: [phone] Fax: [phone]   Has the prescription been filled recently? No  Is the patient out of the medication? No. Patient will be out on 06/28/23 and has 1 pill remaining.  Has the patient been seen for an appointment in the last year OR does the patient have an upcoming appointment? Yes  Can we respond through MyChart? Yes  Agent: Please be advised that Rx refills may take up to 3 business days. We ask that you follow-up with your pharmacy.

## 2023-06-27 NOTE — Telephone Encounter (Signed)
 Requested by interface surescripts. Receipt confirmed by pharmacy 06/27/23 at 1004 am. Duplicate request.  Requested Prescriptions  Refused Prescriptions Disp Refills   hydrochlorothiazide (HYDRODIURIL) 25 MG tablet 30 tablet 3    Sig: Take 1 tablet (25 mg total) by mouth daily.     Cardiovascular: Diuretics - Thiazide Failed - 06/27/2023  3:29 PM      Failed - Cr in normal range and within 180 days    Creat  Date Value Ref Range Status  06/15/2023 1.26 (H) 0.60 - 1.00 mg/dL Final         Failed - Last BP in normal range    BP Readings from Last 1 Encounters:  06/13/23 (!) 142/76         Failed - Valid encounter within last 6 months    Recent Outpatient Visits           2 years ago Encounter for screening mammogram for malignant neoplasm of breast   [facility] Tanya Nones, Priscille Heidelberg, MD   2 years ago Need for immunization against influenza   [facility] Pickard, Priscille Heidelberg, MD       Future Appointments             In 4 days McKenzie, Mardene Celeste, MD [facility] [place]   In 1 week Donita Brooks, MD [facility]            Passed - K in normal range and within 180 days    Potassium  Date Value Ref Range Status  06/15/2023 4.8 3.5 - 5.3 mmol/L Final         Passed - Na in normal range and within 180 days    Sodium  Date Value Ref Range Status  06/15/2023 140 135 - 146 mmol/L Final

## 2023-06-29 ENCOUNTER — Ambulatory Visit: Payer: Self-pay | Admitting: Urology

## 2023-07-01 ENCOUNTER — Other Ambulatory Visit: Payer: Medicare PPO

## 2023-07-01 ENCOUNTER — Ambulatory Visit: Payer: Medicare PPO | Admitting: Urology

## 2023-07-01 ENCOUNTER — Ambulatory Visit (HOSPITAL_COMMUNITY)
Admission: RE | Admit: 2023-07-01 | Discharge: 2023-07-01 | Disposition: A | Discharge status: Home / Self Care | Payer: Medicare PPO | Source: Ambulatory Visit | Attending: Urology | Admitting: Urology

## 2023-07-01 VITALS — BP 156/70 | HR 63

## 2023-07-01 DIAGNOSIS — E039 Hypothyroidism, unspecified: Secondary | ICD-10-CM

## 2023-07-01 DIAGNOSIS — E7800 Pure hypercholesterolemia, unspecified: Secondary | ICD-10-CM | POA: Diagnosis not present

## 2023-07-01 DIAGNOSIS — K5900 Constipation, unspecified: Secondary | ICD-10-CM | POA: Diagnosis not present

## 2023-07-01 DIAGNOSIS — N201 Calculus of ureter: Secondary | ICD-10-CM

## 2023-07-01 DIAGNOSIS — N200 Calculus of kidney: Secondary | ICD-10-CM | POA: Diagnosis not present

## 2023-07-01 DIAGNOSIS — I10 Essential (primary) hypertension: Secondary | ICD-10-CM

## 2023-07-01 DIAGNOSIS — R109 Unspecified abdominal pain: Secondary | ICD-10-CM | POA: Diagnosis not present

## 2023-07-01 NOTE — Progress Notes (Signed)
 07/01/2023 2:09 PM   Edward Jolly [birth year] [id number]  Referring provider: Donita Brooks, MD [address] [place],  [place] [address]  nephrolithiasis   HPI: [name] is a 73yo here for evaluation of nephrolithiasis. She developed gross hematuria in November. She then developed hypertension and presented to the ER in 05/2023. She was diagnosed with a 3mm right UPJ calculus and a 3mm right lower pole calculus. She has had 5 kidney stones starting in 1990. KUB from today shows a 3mm right distal ureteral calculus. She denies any flank pain.    PMH: Past Medical History:  Diagnosis Date   Arthritis    knees   Dental crowns present    High cholesterol    Hypertension    states under control with meds., has been on med > 20 yr.   Hypothyroidism    Osteopenia    TFCC (triangular fibrocartilage complex) tear 03/2017   right wrist/wrist    Surgical History: Past Surgical History:  Procedure Laterality Date   KNEE ARTHROSCOPY W/ MENISCAL REPAIR Left    NASAL SEPTUM SURGERY     TOTAL HIP ARTHROPLASTY Right 07/12/2017   Procedure: RIGHT TOTAL HIP ARTHROPLASTY ANTERIOR APPROACH;  Surgeon: Kathryne Hitch, MD;  Location: [facility];  Service: Orthopedics;  Laterality: Right;   TUBAL LIGATION     WRIST ARTHROSCOPY WITH DEBRIDEMENT Right 04/12/2017   Procedure: RIGHT WRIST ARTHROSCOPY WITH DEBRIDEMENT;  Surgeon: Cindee Salt, MD;  Location: [facility];  Service: Orthopedics;  Laterality: Right;    Home Medications:  Allergies as of 07/01/2023       Reactions   Sulfamethoxazole Other (See Comments)   Codeine Nausea And Vomiting   Sulfa Antibiotics Rash        Medication List        Accurate as of July 01, 2023  2:09 PM. If you have any questions, ask your nurse or doctor.          Apple Cider Vinegar 600 MG Caps Take by mouth.   ascorbic acid 500 MG tablet Commonly known as: VITAMIN C Take 500 mg by mouth daily.   aspirin 81  MG chewable tablet Chew 1 tablet (81 mg total) by mouth 2 (two) times daily.   atorvastatin 20 MG tablet Commonly known as: LIPITOR TAKE 1 TABLET BY MOUTH EVERY DAY   Calcium/Vitamin D 600-400 MG-UNIT Tabs Take 2 tablets by mouth daily.   cetirizine 10 MG tablet Commonly known as: ZYRTEC Take 10 mg by mouth daily.   cloNIDine 0.1 MG tablet Commonly known as: Catapres Take 1 tablet (0.1 mg total) by mouth 3 (three) times daily as needed.   cloNIDine 0.1 MG tablet Commonly known as: CATAPRES Take 1 tablet (0.1 mg total) by mouth 3 (three) times daily.   hydrochlorothiazide 25 MG tablet Commonly known as: HYDRODIURIL Take 1 tablet (25 mg total) by mouth daily.   HYDROcodone-acetaminophen 5-325 MG tablet Commonly known as: Norco Take 1-2 tablets by mouth every 6 (six) hours as needed.   levothyroxine 100 MCG tablet Commonly known as: SYNTHROID Take 1 tablet (100 mcg total) by mouth daily before breakfast.   losartan 100 MG tablet Commonly known as: COZAAR Take 1 tablet (100 mg total) by mouth daily.   meloxicam 15 MG tablet Commonly known as: MOBIC TAKE 1 TABLET BY MOUTH EVERY DAY AS NEEDED FOR JOINT PAIN   nebivolol 10 MG tablet Commonly known as: BYSTOLIC Take 1 tablet (10 mg total)  by mouth daily.   spironolactone 25 MG tablet Commonly known as: ALDACTONE Take 1 tablet (25 mg total) by mouth daily.   tamsulosin 0.4 MG Caps capsule Commonly known as: FLOMAX Take 1 capsule (0.4 mg total) by mouth daily.   zinc gluconate 50 MG tablet Take 50 mg by mouth daily.        Allergies:  Allergies  Allergen Reactions   Sulfamethoxazole Other (See Comments)   Codeine Nausea And Vomiting   Sulfa Antibiotics Rash    Family History: Family History  Problem Relation Age of Onset   Breast cancer Maternal Aunt     Social History:  reports that she has never smoked. She has never used smokeless tobacco. She reports that she does not drink alcohol and does not use  drugs.  ROS: All other review of systems were reviewed and are negative except what is noted above in HPI  Physical Exam: BP (!) 156/70   Pulse 63   Constitutional:  Alert and oriented, No acute distress. HEENT: [place] AT, moist mucus membranes.  Trachea midline, no masses. Cardiovascular: No clubbing, cyanosis, or edema. Respiratory: Normal respiratory effort, no increased work of breathing. GI: Abdomen is soft, nontender, nondistended, no abdominal masses GU: No CVA tenderness.  Lymph: No cervical or inguinal lymphadenopathy. Skin: No rashes, bruises or suspicious lesions. Neurologic: Grossly intact, no focal deficits, moving all 4 extremities. Psychiatric: Normal mood and affect.  Laboratory Data: Lab Results  Component Value Date   WBC 11.0 (H) 05/21/2023   HGB 12.5 05/21/2023   HCT 38.8 05/21/2023   MCV 84.3 05/21/2023   PLT 317 05/21/2023    Lab Results  Component Value Date   CREATININE 1.26 (H) 06/15/2023    No results found for: "PSA"  No results found for: "TESTOSTERONE"  No results found for: "HGBA1C"  Urinalysis    Component Value Date/Time   COLORURINE YELLOW 05/22/2023 0002   APPEARANCEUR CLEAR 05/22/2023 0002   LABSPEC 1.024 05/22/2023 0002   PHURINE 7.0 05/22/2023 0002   GLUCOSEU NEGATIVE 05/22/2023 0002   HGBUR NEGATIVE 05/22/2023 0002   BILIRUBINUR NEGATIVE 05/22/2023 0002   KETONESUR NEGATIVE 05/22/2023 0002   PROTEINUR TRACE (A) 05/22/2023 0002   NITRITE NEGATIVE 05/22/2023 0002   LEUKOCYTESUR TRACE (A) 05/22/2023 0002    Lab Results  Component Value Date   BACTERIA NONE SEEN 05/22/2023    Pertinent Imaging: CT 05/21/2023 and KUb today: Images reviewed and discussed with the patient  Results for orders placed in visit on 05/22/98  DG Abd 1 View  Narrative FINDINGS CLINICAL DATA:   RIGHT-SIDED PAIN.   EVALUATE RIGHT URETERAL STONE. NO COMPARISON EXAMS. THERE IS A 6 X 4 MM CALCIFICATION WITHIN THE RIGHT ASPECT OF THE PELVIS.  IT IS  POSSIBLE THIS REPRESENTS URETERAL STONE. IMPRESSION  No results found for this or any previous visit.  No results found for this or any previous visit.  No results found for this or any previous visit.  No results found for this or any previous visit.  No results found for this or any previous visit.  No results found for this or any previous visit.  Results for orders placed during the hospital encounter of 05/21/23  CT Renal Stone Study  Narrative CLINICAL DATA:  Abdominal pain  EXAM: CT ABDOMEN AND PELVIS WITHOUT CONTRAST  TECHNIQUE: Multidetector CT imaging of the abdomen and pelvis was performed following the standard protocol without IV contrast.  RADIATION DOSE REDUCTION: This exam was performed according to the  departmental dose-optimization program which includes automated exposure control, adjustment of the mA and/or kV according to patient size and/or use of iterative reconstruction technique.  COMPARISON:  CT chest dated 04/30/2022  FINDINGS: Lower chest: Mild bibasilar scarring.  Hepatobiliary: 2.9 cm cyst in segment 4A (series 3/image 16).  Gallbladder is unremarkable. No intrahepatic or extrahepatic duct dilatation.  Pancreas: Within normal limits.  Spleen: Within normal limits.  Adrenals/Urinary Tract: Adrenal glands are within normal limits.  Left kidney is within normal limits.  3 mm nonobstructing right upper pole renal calculus (series 3/image 20).  Mild fullness the right renal collecting system with associated 3 mm proximal right ureteral calculus at the old 2 3 level (coronal image 41).  Bladder is partially obscured by streak artifact but grossly unremarkable.  Stomach/Bowel: Stomach is notable for a tiny hiatal hernia.  No evidence of bowel obstruction.  Normal appendix (series 3/image 84).  No colonic wall thickening or inflammatory changes.  Vascular/Lymphatic: No evidence of abdominal aortic  aneurysm.  Atherosclerotic calcifications of the abdominal aorta and branch vessels.  No suspicious abdominopelvic lymphadenopathy.  Reproductive: Status post hysterectomy.  Left ovary is unremarkable.  No right adnexal mass.  Other: No abdominopelvic ascites.  Musculoskeletal: Degenerative changes of the visualized thoracolumbar spine. Right hip arthroplasty.  IMPRESSION: 3 mm proximal right ureteral calculus. Associated mild fullness the right renal collecting system.  Additional 3 mm nonobstructing right upper pole renal calculus.   Electronically Signed By: Charline Bills M.D. On: 05/22/2023 00:04   Assessment & Plan:    1. Kidney stone (Primary) --We discussed the management of kidney stones. These options include observation, ureteroscopy, shockwave lithotripsy (ESWL) and percutaneous nephrolithotomy (PCNL). We discussed which options are relevant to the patient's stone(s). We discussed the natural history of kidney stones as well as the complications of untreated stones and the impact on quality of life without treatment as well as with each of the above listed treatments. We also discussed the efficacy of each treatment in its ability to clear the stone burden. With any of these management options I discussed the signs and symptoms of infection and the need for emergent treatment should these be experienced. For each option we discussed the ability of each procedure to clear the patient of their stone burden.   For observation I described the risks which include but are not limited to silent renal damage, life-threatening infection, need for emergent surgery, failure to pass stone and pain.   For ureteroscopy I described the risks which include bleeding, infection, damage to contiguous structures, positioning injury, ureteral stricture, ureteral avulsion, ureteral injury, need for prolonged ureteral stent, inability to perform ureteroscopy, need for an interval  procedure, inability to clear stone burden, stent discomfort/pain, heart attack, stroke, pulmonary embolus and the inherent risks with general anesthesia.   For shockwave lithotripsy I described the risks which include arrhythmia, kidney contusion, kidney hemorrhage, need for transfusion, pain, inability to adequately break up stone, inability to pass stone fragments, Steinstrasse, infection associated with obstructing stones, need for alternate surgical procedure, need for repeat shockwave lithotripsy, MI, CVA, PE and the inherent risks with anesthesia/conscious sedation.   For PCNL I described the risks including positioning injury, pneumothorax, hydrothorax, need for chest tube, inability to clear stone burden, renal laceration, arterial venous fistula or malformation, need for embolization of kidney, loss of kidney or renal function, need for repeat procedure, need for prolonged nephrostomy tube, ureteral avulsion, MI, CVA, PE and the inherent risks of general anesthesia.   -  The patient would like to proceed with medical expulsive therapy. Followup 3 weeks with KUB - DG Abd 1 View - Urinalysis, Routine w reflex microscopic   No follow-ups on file.  Wilkie Aye, MD  [facility] [place]

## 2023-07-02 LAB — COMPLETE METABOLIC PANEL WITH GFR
AG Ratio: 2 (calc) (ref 1.0–2.5)
ALT: 11 U/L (ref 6–29)
AST: 13 U/L (ref 10–35)
Albumin: 3.8 g/dL (ref 3.6–5.1)
Alkaline phosphatase (APISO): 84 U/L (ref 37–153)
BUN/Creatinine Ratio: 22 (calc) (ref 6–22)
BUN: 23 mg/dL (ref 7–25)
CO2: 27 mmol/L (ref 20–32)
Calcium: 9.3 mg/dL (ref 8.6–10.4)
Chloride: 107 mmol/L (ref 98–110)
Creat: 1.06 mg/dL — ABNORMAL HIGH (ref 0.60–1.00)
Globulin: 1.9 g/dL (ref 1.9–3.7)
Glucose, Bld: 97 mg/dL (ref 65–99)
Potassium: 4.5 mmol/L (ref 3.5–5.3)
Sodium: 142 mmol/L (ref 135–146)
Total Bilirubin: 0.4 mg/dL (ref 0.2–1.2)
Total Protein: 5.7 g/dL — ABNORMAL LOW (ref 6.1–8.1)
eGFR: 55 mL/min/{1.73_m2} — ABNORMAL LOW (ref >=60)

## 2023-07-02 LAB — LIPID PANEL
Cholesterol: 134 mg/dL (ref <200)
HDL: 52 mg/dL (ref >=50)
LDL Cholesterol (Calc): 62 mg/dL
Non-HDL Cholesterol (Calc): 82 mg/dL (ref <130)
Total CHOL/HDL Ratio: 2.6 (calc) (ref <5.0)
Triglycerides: 118 mg/dL (ref <150)

## 2023-07-02 LAB — CBC WITH DIFFERENTIAL/PLATELET
Absolute Lymphocytes: 1605 {cells}/uL (ref 850–3900)
Absolute Monocytes: 575 {cells}/uL (ref 200–950)
Basophils Absolute: 71 {cells}/uL (ref 0–200)
Basophils Relative: 1 %
Eosinophils Absolute: 817 {cells}/uL — ABNORMAL HIGH (ref 15–500)
Eosinophils Relative: 11.5 %
HCT: 34.8 % — ABNORMAL LOW (ref 35.0–45.0)
Hemoglobin: 11 g/dL — ABNORMAL LOW (ref 11.7–15.5)
MCH: 26.9 pg — ABNORMAL LOW (ref 27.0–33.0)
MCHC: 31.6 g/dL — ABNORMAL LOW (ref 32.0–36.0)
MCV: 85.1 fL (ref 80.0–100.0)
MPV: 12.8 fL — ABNORMAL HIGH (ref 7.5–12.5)
Monocytes Relative: 8.1 %
Neutro Abs: 4033 {cells}/uL (ref 1500–7800)
Neutrophils Relative %: 56.8 %
Platelets: 179 10*3/uL (ref 140–400)
RBC: 4.09 10*6/uL (ref 3.80–5.10)
RDW: 14.8 % (ref 11.0–15.0)
Total Lymphocyte: 22.6 %
WBC: 7.1 10*3/uL (ref 3.8–10.8)

## 2023-07-02 LAB — TSH: TSH: 5.54 m[IU]/L — ABNORMAL HIGH (ref 0.40–4.50)

## 2023-07-04 ENCOUNTER — Encounter: Payer: Self-pay | Admitting: Family Medicine

## 2023-07-04 ENCOUNTER — Ambulatory Visit (INDEPENDENT_AMBULATORY_CARE_PROVIDER_SITE_OTHER): Payer: Medicare PPO | Admitting: Family Medicine

## 2023-07-04 VITALS — BP 142/64 | HR 63 | Temp 97.8°F | Ht 62.0 in | Wt 183.6 lb

## 2023-07-04 DIAGNOSIS — E039 Hypothyroidism, unspecified: Secondary | ICD-10-CM | POA: Diagnosis not present

## 2023-07-04 DIAGNOSIS — D649 Anemia, unspecified: Secondary | ICD-10-CM | POA: Diagnosis not present

## 2023-07-04 DIAGNOSIS — Z0001 Encounter for general adult medical examination with abnormal findings: Secondary | ICD-10-CM | POA: Diagnosis not present

## 2023-07-04 DIAGNOSIS — I1A0 Resistant hypertension: Secondary | ICD-10-CM | POA: Diagnosis not present

## 2023-07-04 DIAGNOSIS — Z0000 Encounter for general adult medical examination without abnormal findings: Secondary | ICD-10-CM

## 2023-07-04 LAB — URINALYSIS, ROUTINE W REFLEX MICROSCOPIC
Bilirubin, UA: NEGATIVE
Glucose, UA: NEGATIVE
Ketones, UA: NEGATIVE
Leukocytes,UA: NEGATIVE
Nitrite, UA: NEGATIVE
Protein,UA: NEGATIVE
Specific Gravity, UA: <1.005 — ABNORMAL LOW (ref 1.005–1.030)
Urobilinogen, Ur: 0.2 mg/dL (ref 0.2–1.0)
pH, UA: 7 (ref 5.0–7.5)

## 2023-07-04 LAB — MICROSCOPIC EXAMINATION: Bacteria, UA: NONE SEEN

## 2023-07-04 MED ORDER — CLONIDINE 0.3 MG/24HR TD PTWK: 0.3000 mg | MEDICATED_PATCH | TRANSDERMAL | 12 refills | Status: DC

## 2023-07-04 NOTE — Progress Notes (Signed)
 Subjective:    Patient ID: Jean Baldwin, female    DOB: [birth year], 73 y.o.   MRN: [id number]  HPI Patient is a very pleasant 73 year old Caucasian female here today for complete physical exam.  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  Renal artery [place] was unremarkable.  Evaluation for hyperaldosteronism was negative and spironolactone did not affect blood pressure.  Evaluation for pheochromocytoma was negative.   Patient has treated sleep apnea. Echo 12/24 was normal. Immunizations are listed below: Immunization History  Administered Date(s) Administered   Fluad Quad(high Dose 65+) 02/20/2021, 03/05/2022   Fluad Trivalent(High Dose 65+) 03/02/2023   Influenza Split 02/25/2016, 02/27/2017   Influenza,inj,Quad PF,6+ Mos 02/14/2018, 02/02/2019, 02/19/2020   Influenza,inj,quad, With Preservative 02/14/2018, 02/02/2019, 02/19/2020   Influenza-Unspecified 02/25/2016, 02/27/2017   PFIZER(Purple Top)SARS-COV-2 Vaccination 07/02/2019, 07/23/2019, 02/10/2020   Pfizer Covid-19 Vaccine Bivalent Booster 51yrs & up 05/31/2021   Pneumococcal Conjugate-13 04/09/2016, 04/09/2016   Pneumococcal Polysaccharide-23 04/26/2017   Pneumococcal-Unspecified 04/26/2017   Zoster Recombinant(Shingrix) 01/09/2019, 04/10/2019   Zoster, Live 01/09/2019, 04/10/2019   Mammogram 4/24 Colonoscopy 2023- repeat in 5 years DEXA 2021- osteopenia    Past Medical History:  Diagnosis Date   Arthritis    knees   Dental crowns present    High cholesterol    Hypertension    states under control with meds., has been on med > 20 yr.   Hypothyroidism    Osteopenia    TFCC (triangular fibrocartilage complex) tear 03/2017   right wrist/wrist   Past Surgical History:  Procedure Laterality Date   KNEE ARTHROSCOPY W/ MENISCAL REPAIR Left    NASAL SEPTUM SURGERY     TOTAL HIP ARTHROPLASTY Right 07/12/2017   Procedure: RIGHT TOTAL HIP ARTHROPLASTY ANTERIOR APPROACH;  Surgeon: Kathryne Hitch, MD;   Location: [facility];  Service: Orthopedics;  Laterality: Right;   TUBAL LIGATION     WRIST ARTHROSCOPY WITH DEBRIDEMENT Right 04/12/2017   Procedure: RIGHT WRIST ARTHROSCOPY WITH DEBRIDEMENT;  Surgeon: Cindee Salt, MD;  Location: [facility];  Service: Orthopedics;  Laterality: Right;   Current Outpatient Medications on File Prior to Visit  Medication Sig Dispense Refill   Apple Cider Vinegar 600 MG CAPS Take by mouth. (Patient not taking: Reported on 06/13/2023)     aspirin 81 MG chewable tablet Chew 1 tablet (81 mg total) by mouth 2 (two) times daily. 35 tablet 0   atorvastatin (LIPITOR) 20 MG tablet TAKE 1 TABLET BY MOUTH EVERY DAY 90 tablet 3   Calcium Carb-Cholecalciferol (CALCIUM/VITAMIN D) 600-400 MG-UNIT TABS Take 2 tablets by mouth daily.     cetirizine (ZYRTEC) 10 MG tablet Take 10 mg by mouth daily.     cloNIDine (CATAPRES) 0.1 MG tablet Take 1 tablet (0.1 mg total) by mouth 3 (three) times daily as needed. 20 tablet 0   cloNIDine (CATAPRES) 0.1 MG tablet Take 1 tablet (0.1 mg total) by mouth 3 (three) times daily. 90 tablet 11   hydrochlorothiazide (HYDRODIURIL) 25 MG tablet Take 1 tablet (25 mg total) by mouth daily. 30 tablet 3   HYDROcodone-acetaminophen (NORCO) 5-325 MG tablet Take 1-2 tablets by mouth every 6 (six) hours as needed. (Patient not taking: Reported on 06/13/2023) 15 tablet 0   levothyroxine (SYNTHROID) 100 MCG tablet Take 1 tablet (100 mcg total) by mouth daily before breakfast. 90 tablet 0   losartan (COZAAR) 100 MG tablet Take 1 tablet (100 mg total) by mouth daily. 90 tablet 0   meloxicam (MOBIC) 15 MG tablet TAKE 1  TABLET BY MOUTH EVERY DAY AS NEEDED FOR JOINT PAIN 90 tablet 2   nebivolol (BYSTOLIC) 10 MG tablet Take 1 tablet (10 mg total) by mouth daily. 30 tablet 1   spironolactone (ALDACTONE) 25 MG tablet Take 1 tablet (25 mg total) by mouth daily. 90 tablet 3   tamsulosin (FLOMAX) 0.4 MG CAPS capsule Take 1 capsule (0.4 mg total) by mouth daily. 15  capsule 0   vitamin C (ASCORBIC ACID) 500 MG tablet Take 500 mg by mouth daily.     zinc gluconate 50 MG tablet Take 50 mg by mouth daily.     No current facility-administered medications on file prior to visit.   Allergies  Allergen Reactions   Sulfamethoxazole Other (See Comments)   Codeine Nausea And Vomiting   Sulfa Antibiotics Rash    Social History   Socioeconomic History   Marital status: Married    Spouse name: Not on file   Number of children: Not on file   Years of education: Not on file   Highest education level: 12th grade  Occupational History   Not on file  Tobacco Use   Smoking status: Never   Smokeless tobacco: Never  Vaping Use   Vaping status: Never Used  Substance and Sexual Activity   Alcohol use: No   Drug use: No   Sexual activity: Yes  Other Topics Concern   Not on file  Social History Narrative   Not on file   Social Drivers of Health   Financial Resource Strain: Low Risk  (06/13/2023)   Overall Financial Resource Strain (CARDIA)    Difficulty of Paying Living Expenses: Not hard at all  Food Insecurity: No Food Insecurity (06/13/2023)   Hunger Vital Sign    Worried About Running Out of Food in the Last Year: Never true    Ran Out of Food in the Last Year: Never true  Transportation Needs: No Transportation Needs (06/13/2023)   PRAPARE - Administrator, Civil Service (Medical): No    Lack of Transportation (Non-Medical): No  Physical Activity: Unknown (06/13/2023)   Exercise Vital Sign    Days of Exercise per Week: 0 days    Minutes of Exercise per Session: Not on file  Stress: No Stress Concern Present (06/13/2023)   [employer] of Occupational Health - Occupational Stress Questionnaire    Feeling of Stress : Only a little  Social Connections: Socially Integrated (06/13/2023)   Social Connection and Isolation Panel [NHANES]    Frequency of Communication with Friends and Family: More than three times a week    Frequency of Social  Gatherings with Friends and Family: More than three times a week    Attends Religious Services: More than 4 times per year    Active Member of [employer] or Organizations: Yes    Attends Engineer, structural: More than 4 times per year    Marital Status: Married  Catering manager Violence: Not on file     Review of Systems  All other systems reviewed and are negative.      Objective:   Physical Exam Constitutional:      Appearance: Normal appearance. She is normal weight.  Cardiovascular:     Rate and Rhythm: Normal rate and regular rhythm.     Pulses: Normal pulses.     Heart sounds: Normal heart sounds. No murmur heard.    No gallop.  Pulmonary:     Effort: Pulmonary effort is normal. No respiratory distress.  Breath sounds: Normal breath sounds. No stridor. No wheezing, rhonchi or rales.  Abdominal:     General: Bowel sounds are normal.     Palpations: Abdomen is soft.  Musculoskeletal:     Right lower leg: No edema.     Left lower leg: No edema.  Neurological:     General: No focal deficit present.     Mental Status: She is alert and oriented to person, place, and time.           Assessment & Plan:  Anemia, unspecified type - Plan: Fecal Globin By Immunochemistry  General medical exam  Resistant hypertension  Hypothyroidism, unspecified type We discussed immunizations today.  The patient defers the tetanus shot for now.  Recommended COVID and RSV but the patient politely declines these.  We will perform a bone density test next year.  Patient's mammogram is due in April.  Colonoscopy is up-to-date and not due again until 2028.  Blood pressures remain slightly elevated.  Transition patient from clonidine 0.1 mg twice daily to clonidine patch 0.3 mg per 24 hours.  The patient will wear the patch weekly and monitor her blood pressure.  We elected not to increase her dose of levothyroxine.  We will recheck a TSH at her follow-up appointment in 4 months.

## 2023-07-05 ENCOUNTER — Encounter: Payer: Self-pay | Admitting: Urology

## 2023-07-05 DIAGNOSIS — D649 Anemia, unspecified: Secondary | ICD-10-CM | POA: Diagnosis not present

## 2023-07-05 NOTE — Patient Instructions (Signed)

## 2023-07-07 ENCOUNTER — Ambulatory Visit: Payer: Medicare PPO | Admitting: Urology

## 2023-07-07 DIAGNOSIS — E6609 Other obesity due to excess calories: Secondary | ICD-10-CM | POA: Diagnosis not present

## 2023-07-07 DIAGNOSIS — E039 Hypothyroidism, unspecified: Secondary | ICD-10-CM | POA: Diagnosis not present

## 2023-07-07 DIAGNOSIS — I10 Essential (primary) hypertension: Secondary | ICD-10-CM | POA: Diagnosis not present

## 2023-07-07 DIAGNOSIS — M159 Polyosteoarthritis, unspecified: Secondary | ICD-10-CM | POA: Diagnosis not present

## 2023-07-07 DIAGNOSIS — E782 Mixed hyperlipidemia: Secondary | ICD-10-CM | POA: Diagnosis not present

## 2023-07-07 DIAGNOSIS — E66811 Obesity, class 1: Secondary | ICD-10-CM | POA: Diagnosis not present

## 2023-07-07 DIAGNOSIS — F322 Major depressive disorder, single episode, severe without psychotic features: Secondary | ICD-10-CM | POA: Diagnosis not present

## 2023-07-07 DIAGNOSIS — Z6832 Body mass index (BMI) 32.0-32.9, adult: Secondary | ICD-10-CM | POA: Diagnosis not present

## 2023-07-07 DIAGNOSIS — G4733 Obstructive sleep apnea (adult) (pediatric): Secondary | ICD-10-CM | POA: Diagnosis not present

## 2023-07-08 LAB — FECAL GLOBIN BY IMMUNOCHEMISTRY
FECAL GLOBIN RESULT:: DETECTED — AB
MICRO NUMBER:: 16137949
SPECIMEN QUALITY:: ADEQUATE

## 2023-07-12 DIAGNOSIS — R195 Other fecal abnormalities: Secondary | ICD-10-CM | POA: Diagnosis not present

## 2023-07-12 DIAGNOSIS — D649 Anemia, unspecified: Secondary | ICD-10-CM | POA: Diagnosis not present

## 2023-07-13 ENCOUNTER — Other Ambulatory Visit: Payer: Self-pay | Admitting: Family Medicine

## 2023-07-13 DIAGNOSIS — Z1231 Encounter for screening mammogram for malignant neoplasm of breast: Secondary | ICD-10-CM

## 2023-07-18 NOTE — Progress Notes (Unsigned)
 Name: Jean Baldwin DOB: 28-Sep-1950 MRN: [id number]  History of Present Illness: [name] is a 73 y.o. female who presents today for follow up visit at [facility] [place]. - GU History: 1. Kidney stones.  Recent history: > 05/21/2023: CT stone showed: - 3 mm proximal right ureteral calculus. Associated mild fullness the right renal collecting system. - Additional 3 mm nonobstructing right upper pole renal calculus.  > 06/01/2023: US renal artery duplex showed no evidence to of significant renal artery stenosis bilaterally.   > 07/01/2023:  - Initial visit with Dr. Ronne Binning. - KUB showed a 3mm right distal ureteral calculus.  - The plan was medical expulsive therapy with Flomax.  Today: KUB today: Awaiting radiology read; no ureteral stones appreciated per provider interpretation.  She denies flank pain or abdominal pain. She denies fevers, nausea, or vomiting.  She denies increased urinary urgency, frequency, nocturia, dysuria, gross hematuria, hesitancy, straining to void, or sensations of incomplete emptying.  Medications: Current Outpatient Medications  Medication Sig Dispense Refill   aspirin 81 MG chewable tablet Chew 1 tablet (81 mg total) by mouth 2 (two) times daily. 35 tablet 0   atorvastatin (LIPITOR) 20 MG tablet TAKE 1 TABLET BY MOUTH EVERY DAY 90 tablet 3   Calcium Carb-Cholecalciferol (CALCIUM/VITAMIN D) 600-400 MG-UNIT TABS Take 2 tablets by mouth daily.     cetirizine (ZYRTEC) 10 MG tablet Take 10 mg by mouth daily.     cloNIDine (CATAPRES) 0.1 MG tablet Take 1 tablet (0.1 mg total) by mouth 3 (three) times daily as needed. 20 tablet 0   hydrochlorothiazide (HYDRODIURIL) 25 MG tablet Take 1 tablet (25 mg total) by mouth daily. 30 tablet 3   levothyroxine (SYNTHROID) 100 MCG tablet Take 1 tablet (100 mcg total) by mouth daily before breakfast. 90 tablet 0   losartan (COZAAR) 100 MG tablet Take 1 tablet (100 mg total) by mouth daily. 90 tablet 0    meloxicam (MOBIC) 15 MG tablet TAKE 1 TABLET BY MOUTH EVERY DAY AS NEEDED FOR JOINT PAIN 90 tablet 2   vitamin C (ASCORBIC ACID) 500 MG tablet Take 500 mg by mouth daily.     zinc gluconate 50 MG tablet Take 50 mg by mouth daily.     Apple Cider Vinegar 600 MG CAPS Take by mouth. (Patient not taking: Reported on 07/19/2023)     cloNIDine (CATAPRES - DOSED IN MG/24 HR) 0.3 mg/24hr patch Place 1 patch (0.3 mg total) onto the skin once a week. (Patient not taking: Reported on 07/19/2023) 4 patch 12   nebivolol (BYSTOLIC) 10 MG tablet Take 1 tablet (10 mg total) by mouth daily. (Patient not taking: Reported on 07/19/2023) 30 tablet 1   No current facility-administered medications for this visit.    Allergies: Allergies  Allergen Reactions   Sulfamethoxazole Other (See Comments)   Codeine Nausea And Vomiting   Sulfa Antibiotics Rash    Past Medical History:  Diagnosis Date   Arthritis    knees   Dental crowns present    High cholesterol    Hypertension    states under control with meds., has been on med > 20 yr.   Hypothyroidism    Osteopenia    TFCC (triangular fibrocartilage complex) tear 03/2017   right wrist/wrist   Past Surgical History:  Procedure Laterality Date   KNEE ARTHROSCOPY W/ MENISCAL REPAIR Left    NASAL SEPTUM SURGERY     TOTAL HIP ARTHROPLASTY Right 07/12/2017   Procedure: RIGHT TOTAL HIP  ARTHROPLASTY ANTERIOR APPROACH;  Surgeon: Kathryne Hitch, MD;  [facility];  Service: Orthopedics;  Laterality: Right;   TUBAL LIGATION     WRIST ARTHROSCOPY WITH DEBRIDEMENT Right 04/12/2017   Procedure: RIGHT WRIST ARTHROSCOPY WITH DEBRIDEMENT;  Surgeon: Cindee Salt, MD;  Location: [facility];  Service: Orthopedics;  Laterality: Right;   Family History  Problem Relation Age of Onset   Breast cancer Maternal Aunt    Social History   Socioeconomic History   Marital status: Married    Spouse name: Not on file   Number of children: Not on file    Years of education: Not on file   Highest education level: 12th grade  Occupational History   Not on file  Tobacco Use   Smoking status: Never   Smokeless tobacco: Never  Vaping Use   Vaping status: Never Used  Substance and Sexual Activity   Alcohol use: No   Drug use: No   Sexual activity: Yes  Other Topics Concern   Not on file  Social History Narrative   Not on file   Social Drivers of Health   Financial Resource Strain: Low Risk  (06/13/2023)   Overall Financial Resource Strain (CARDIA)    Difficulty of Paying Living Expenses: Not hard at all  Food Insecurity: No Food Insecurity (06/13/2023)   Hunger Vital Sign    Worried About Running Out of Food in the Last Year: Never true    Ran Out of Food in the Last Year: Never true  Transportation Needs: No Transportation Needs (06/13/2023)   PRAPARE - Administrator, Civil Service (Medical): No    Lack of Transportation (Non-Medical): No  Physical Activity: Unknown (06/13/2023)   Exercise Vital Sign    Days of Exercise per Week: 0 days    Minutes of Exercise per Session: Not on file  Stress: No Stress Concern Present (06/13/2023)   [employer] of Occupational Health - Occupational Stress Questionnaire    Feeling of Stress : Only a little  Social Connections: Socially Integrated (06/13/2023)   Social Connection and Isolation Panel [NHANES]    Frequency of Communication with Friends and Family: More than three times a week    Frequency of Social Gatherings with Friends and Family: More than three times a week    Attends Religious Services: More than 4 times per year    Active Member of [employer] or Organizations: Yes    Attends Engineer, structural: More than 4 times per year    Marital Status: Married  Catering manager Violence: Not on file    SUBJECTIVE  Review of Systems Constitutional: Patient denies any unintentional weight loss or change in strength lntegumentary: Patient denies any rashes or  pruritus Cardiovascular: Patient denies chest pain or syncope Respiratory: Patient denies shortness of breath Gastrointestinal: Patient denies nausea or vomiting Musculoskeletal: Patient denies muscle cramps or weakness Neurologic: Patient denies convulsions or seizures Allergic/Immunologic: Patient denies recent allergic reaction(s) Hematologic/Lymphatic: Patient denies bleeding tendencies Endocrine: Patient denies heat/cold intolerance  GU: As per HPI.  OBJECTIVE Vitals:   07/19/23 1254  BP: 121/72  Pulse: 63  Temp: (!) 97.5 F (36.4 C)   There is no height or weight on file to calculate BMI.  Physical Examination Constitutional: No obvious distress; patient is non-toxic appearing  Cardiovascular: No visible lower extremity edema.  Respiratory: The patient does not have audible wheezing/stridor; respirations do not appear labored  Gastrointestinal: Abdomen non-distended Musculoskeletal: Normal ROM of UEs  Skin: No obvious rashes/open sores  Neurologic: CN 2-12 grossly intact Psychiatric: Answered questions appropriately with normal affect  Hematologic/Lymphatic/Immunologic: No obvious bruises or sites of spontaneous bleeding  UA: negative   ASSESSMENT Right ureteral stone - Plan: Urinalysis, Routine w reflex microscopic  Kidney stones - Plan: Urinalysis, Routine w reflex microscopic, DG Abd 1 View  We reviewed recent imaging results; awaiting radiology results, appears to have no acute findings per provider interpretation.  Will plan to follow up in 6 months with KUB for stone surveillance or sooner if needed. Patient verbalized understanding of and agreement with current plan. All questions were answered.  PLAN Advised the following: Return in about 6 months (around 01/19/2024) for KUB, UA, & f/u with Evette Georges NP.  Orders Placed This Encounter  Procedures   DG Abd 1 View    Standing Status:   Future    Expected Date:   01/19/2024    Expiration Date:    07/18/2024    Reason for Exam (SYMPTOM  OR DIAGNOSIS REQUIRED):   kidney stone    Preferred imaging location?:   [facility]   Urinalysis, Routine w reflex microscopic    It has been explained that the patient is to follow regularly with their PCP in addition to all other providers involved in their care and to follow instructions provided by these respective offices. Patient advised to contact urology clinic if any urologic-pertaining questions, concerns, new symptoms or problems arise in the interim period.  There are no Patient Instructions on file for this visit.  Electronically signed by:  Donnita Falls, MSN, FNP-C, CUNP 07/19/2023 1:28 PM

## 2023-07-19 ENCOUNTER — Ambulatory Visit (HOSPITAL_COMMUNITY)
Admission: RE | Admit: 2023-07-19 | Discharge: 2023-07-19 | Disposition: A | Discharge status: Home / Self Care | Source: Ambulatory Visit | Attending: Urology | Admitting: Urology

## 2023-07-19 ENCOUNTER — Ambulatory Visit: Payer: Medicare PPO | Admitting: Urology

## 2023-07-19 ENCOUNTER — Encounter: Payer: Self-pay | Admitting: Urology

## 2023-07-19 VITALS — BP 121/72 | HR 63 | Temp 97.5°F

## 2023-07-19 DIAGNOSIS — N201 Calculus of ureter: Secondary | ICD-10-CM

## 2023-07-19 DIAGNOSIS — N200 Calculus of kidney: Secondary | ICD-10-CM | POA: Insufficient documentation

## 2023-07-19 DIAGNOSIS — Z96641 Presence of right artificial hip joint: Secondary | ICD-10-CM | POA: Diagnosis not present

## 2023-07-19 LAB — URINALYSIS, ROUTINE W REFLEX MICROSCOPIC
Bilirubin, UA: NEGATIVE
Glucose, UA: NEGATIVE
Ketones, UA: NEGATIVE
Leukocytes,UA: NEGATIVE
Nitrite, UA: NEGATIVE
Protein,UA: NEGATIVE
RBC, UA: NEGATIVE
Specific Gravity, UA: 1.01 (ref 1.005–1.030)
Urobilinogen, Ur: 0.2 mg/dL (ref 0.2–1.0)
pH, UA: 7 (ref 5.0–7.5)

## 2023-07-20 DIAGNOSIS — H524 Presbyopia: Secondary | ICD-10-CM | POA: Diagnosis not present

## 2023-07-20 DIAGNOSIS — H2513 Age-related nuclear cataract, bilateral: Secondary | ICD-10-CM | POA: Diagnosis not present

## 2023-07-20 DIAGNOSIS — H53143 Visual discomfort, bilateral: Secondary | ICD-10-CM | POA: Diagnosis not present

## 2023-07-20 DIAGNOSIS — H43393 Other vitreous opacities, bilateral: Secondary | ICD-10-CM | POA: Diagnosis not present

## 2023-08-04 ENCOUNTER — Encounter (HOSPITAL_BASED_OUTPATIENT_CLINIC_OR_DEPARTMENT_OTHER): Payer: Self-pay | Admitting: Family

## 2023-08-04 ENCOUNTER — Ambulatory Visit (HOSPITAL_BASED_OUTPATIENT_CLINIC_OR_DEPARTMENT_OTHER): Payer: Medicare PPO | Admitting: Family

## 2023-08-04 VITALS — BP 122/69 | HR 69 | Resp 15 | Ht 62.0 in | Wt 174.0 lb

## 2023-08-04 DIAGNOSIS — I10 Essential (primary) hypertension: Secondary | ICD-10-CM | POA: Diagnosis not present

## 2023-08-04 DIAGNOSIS — E782 Mixed hyperlipidemia: Secondary | ICD-10-CM | POA: Diagnosis not present

## 2023-08-04 DIAGNOSIS — I700 Atherosclerosis of aorta: Secondary | ICD-10-CM | POA: Diagnosis not present

## 2023-08-04 DIAGNOSIS — Z8249 Family history of ischemic heart disease and other diseases of the circulatory system: Secondary | ICD-10-CM | POA: Diagnosis not present

## 2023-08-04 MED ORDER — VALSARTAN 160 MG PO TABS: 160.0000 mg | ORAL_TABLET | Freq: Every day | ORAL | 1 refills | Status: DC

## 2023-08-04 MED ORDER — METOPROLOL TARTRATE 100 MG PO TABS: ORAL_TABLET | ORAL | 0 refills | Status: DC

## 2023-08-04 NOTE — Patient Instructions (Addendum)
 Medication Instructions:  STOP Losartan  START Valsartan 160mg  daily   Labwork: Your physician recommends that you return for lab work in 1 week: BMET   Testing/Procedures:   Your cardiac CT will be scheduled at one of the below locations:   [facility] [address] [place], [place] [address] [phone]  If scheduled at [facility], please arrive at the [facility] En[facility]ance (Entrance C2) of [facility] 30 minutes prior to test start time. You can use the FREE valet parking offered at entrance C (encouraged to control the heart rate for the test)  Proceed to the [facility] Radiology Department (first floor) to check-in and test prep.  All radiology patients and guests should use entrance C2 at [facility], accessed from [facility], even though the hospital's physical address listed is [address].    Please follow these instructions carefully (unless otherwise directed):  An IV will be required for this test and Nitroglycerin will be given.   On the Night Before the Test: Be sure to Drink plenty of water. Do not consume any caffeinated/decaffeinated beverages or chocolate 12 hours prior to your test. Do not take any antihistamines 12 hours prior to your test.  On the Day of the Test: Drink plenty of water until 1 hour prior to the test. Do not eat any food 1 hour prior to test. You may take your regular medications prior to the test.  Take metoprolol (Lopressor) two hours prior to test. If you take Furosemide/Hydrochlorothiazide/Spironolactone/Chlorthalidone, please HOLD on the morning of the test. Patients who wear a continuous glucose monitor MUST remove the device prior to scanning. FEMALES- please wear underwire-free bra if available, avoid dresses & tight clothing      After the Test: Drink plenty of water. After receiving IV contrast, you may experience a mild flushed feeling. This is  normal. On occasion, you may experience a mild rash up to 24 hours after the test. This is not dangerous. If this occurs, you can take Benadryl 25 mg, Zyrtec, Claritin, or Allegra and increase your fluid intake. (Patients taking Tikosyn should avoid Benadryl, and may take Zyrtec, Claritin, or Allegra) If you experience trouble breathing, this can be serious. If it is severe call 911 IMMEDIATELY. If it is mild, please call our office.  We will call to schedule your test 2-4 weeks out understanding that some insurance companies will need an authorization prior to the service being performed.   For more information and frequently asked questions, please visit our website : [email or web address]  For non-scheduling related questions, please contact the cardiac imaging nurse navigator should you have any questions/concerns: Cardiac Imaging Nurse Navigators Direct Office Dial: [phone]   For scheduling needs, including cancellations and rescheduling, please call [place], [phone].   Follow-Up: In 6 weeks with Alver Sorrow, NP in [facility]

## 2023-08-04 NOTE — Progress Notes (Signed)
 [facility] Initial Assessment:    Date:  08/04/2023   ID:  Jean Baldwin, DOB [birth year], MRN [id number]  PCP:  Donita Brooks, MD  Cardiologist:  None  Nephrologist:  Referring MD: Donita Brooks, MD   CC: Hypertension  History of Present Illness:    Jean Baldwin is a 73 y.o. female with a hx of hypertension, hypothyroidism, OSA, HLD. Here to establish care in the [facility]. She requested cardiology evaluation due to family history as her husband also follows with our practice. Family history notable for father with PPM, and maternal grandfather with fatal MI.   Floreen Teegarden was diagnosed with hypertension in her 20-30s.  Had to take medications during pregnancy but reports she did not have preeclampsia. It has been difficult to control. Blood pressure checked with arm cuff at home. she reports tobacco use never. For exercise she works in her yard and walks at her [employer]. she eats at home and outside of the home and does follow low sodium diet.   Prior secondary workup: 24h metanephrines 06/15/23 were normal.  05/2023 renal arteries no stenosis 06/13/23 labs not consistent with hyperaldosteronism  On clonidine patch with dry mouth. Mid November noted more swelling in her left leg. She went to get an injection with her orthopedist for her knee who did not feel her swelling was related to her arthritis. Her Amlodipine was subsequently discontinued with improvement in edema.   BP at home has been 150/63, 137/66, 126/57, 146/53, 160/61, 142/62, 156/55, 141/59  Previous antihypertensives: Nebivolol - ineffective Spironolactone Amlodipine - LE edema Clonidine tablet - changed to patch   Past Medical History:  Diagnosis Date   Arthritis    knees   Dental crowns present    High cholesterol    Hypertension    states under control with meds., has been on med > 20 yr.   Hypothyroidism    Osteopenia    TFCC  (triangular fibrocartilage complex) tear 03/2017   right wrist/wrist    Past Surgical History:  Procedure Laterality Date   KNEE ARTHROSCOPY W/ MENISCAL REPAIR Left    NASAL SEPTUM SURGERY     TOTAL HIP ARTHROPLASTY Right 07/12/2017   Procedure: RIGHT TOTAL HIP ARTHROPLASTY ANTERIOR APPROACH;  Surgeon: Kathryne Hitch, MD;  Location: [facility] OR;  Service: Orthopedics;  Laterality: Right;   TUBAL LIGATION     WRIST ARTHROSCOPY WITH DEBRIDEMENT Right 04/12/2017   Procedure: RIGHT WRIST ARTHROSCOPY WITH DEBRIDEMENT;  Surgeon: Cindee Salt, MD;  Location: [facility];  Service: Orthopedics;  Laterality: Right;    Current Medications: Current Meds  Medication Sig   aspirin 81 MG chewable tablet Chew 1 tablet (81 mg total) by mouth 2 (two) times daily.   atorvastatin (LIPITOR) 20 MG tablet TAKE 1 TABLET BY MOUTH EVERY DAY   Calcium Carb-Cholecalciferol (CALCIUM/VITAMIN D) 600-400 MG-UNIT TABS Take 2 tablets by mouth daily.   cetirizine (ZYRTEC) 10 MG tablet Take 10 mg by mouth daily.   cloNIDine (CATAPRES - DOSED IN MG/24 HR) 0.3 mg/24hr patch Place 0.3 mg onto the skin once a week.   hydrochlorothiazide (HYDRODIURIL) 25 MG tablet Take 1 tablet (25 mg total) by mouth daily.   levothyroxine (SYNTHROID) 100 MCG tablet Take 1 tablet (100 mcg total) by mouth daily before breakfast.   losartan (COZAAR) 100 MG tablet Take 1 tablet (100 mg total) by mouth daily.   meloxicam (MOBIC) 15 MG tablet TAKE 1 TABLET BY  MOUTH EVERY DAY AS NEEDED FOR JOINT PAIN   Omega-3 Fatty Acids (FISH OIL) 500 MG CAPS Take by mouth.   vitamin C (ASCORBIC ACID) 500 MG tablet Take 500 mg by mouth daily.   zinc gluconate 50 MG tablet Take 50 mg by mouth daily.     Allergies:   Sulfamethoxazole, Codeine, and Sulfa antibiotics   Social History   Socioeconomic History   Marital status: Married    Spouse name: Not on file   Number of children: 2   Years of education: Not on file   Highest education level:  12th grade  Occupational History   Not on file  Tobacco Use   Smoking status: Never   Smokeless tobacco: Never  Vaping Use   Vaping status: Never Used  Substance and Sexual Activity   Alcohol use: No   Drug use: No   Sexual activity: Yes  Other Topics Concern   Not on file  Social History Narrative   Not on file   Social Drivers of Health   Financial Resource Strain: Low Risk  (06/13/2023)   Overall Financial Resource Strain (CARDIA)    Difficulty of Paying Living Expenses: Not hard at all  Food Insecurity: No Food Insecurity (06/13/2023)   Hunger Vital Sign    Worried About Running Out of Food in the Last Year: Never true    Ran Out of Food in the Last Year: Never true  Transportation Needs: No Transportation Needs (06/13/2023)   PRAPARE - Administrator, Civil Service (Medical): No    Lack of Transportation (Non-Medical): No  Physical Activity: Unknown (06/13/2023)   Exercise Vital Sign    Days of Exercise per Week: 0 days    Minutes of Exercise per Session: Not on file  Stress: No Stress Concern Present (06/13/2023)   [employer] of Occupational Health - Occupational Stress Questionnaire    Feeling of Stress : Only a little  Social Connections: Socially Integrated (06/13/2023)   Social Connection and Isolation Panel [NHANES]    Frequency of Communication with Friends and Family: More than three times a week    Frequency of Social Gatherings with Friends and Family: More than three times a week    Attends Religious Services: More than 4 times per year    Active Member of [employer] or Organizations: Yes    Attends Engineer, structural: More than 4 times per year    Marital Status: Married     Family History: The patient's family history includes Breast cancer in her maternal aunt; Heart disease in her father.  ROS:   Please see the history of present illness.     All other systems reviewed and are negative.  EKGs/Labs/Other Studies Reviewed:          Recent Labs: 07/01/2023: ALT 11; BUN 23; Creat 1.06; Hemoglobin 11.0; Platelets 179; Potassium 4.5; Sodium 142; TSH 5.54   Recent Lipid Panel    Component Value Date/Time   CHOL 134 07/01/2023 0817   TRIG 118 07/01/2023 0817   HDL 52 07/01/2023 0817   CHOLHDL 2.6 07/01/2023 0817   LDLCALC 62 07/01/2023 0817    Physical Exam:   VS:  BP 122/69 (BP Location: Left Arm, Patient Position: Sitting, Cuff Size: Large)   Pulse 69   Resp 15   Ht 5\' 2"  (1.575 m)   Wt 174 lb (78.9 kg)   SpO2 98%   BMI 31.83 kg/m  , BMI Body mass index is 31.83 kg/m.  GENERAL:  Well appearing HEENT: Pupils equal round and reactive, fundi not visualized, oral mucosa unremarkable NECK:  No jugular venous distention, waveform within normal limits, carotid upstroke brisk and symmetric, no bruits, no thyromegaly LYMPHATICS:  No cervical adenopathy LUNGS:  Clear to auscultation bilaterally HEART:  RRR.  PMI not displaced or sustained,S1 and S2 within normal limits, no S3, no S4, no clicks, no rubs, no murmurs ABD:  Flat, positive bowel sounds normal in frequency in pitch, no bruits, no rebound, no guarding, no midline pulsatile mass, no hepatomegaly, no splenomegaly EXT:  2 plus pulses throughout, no edema, no cyanosis no clubbing SKIN:  No rashes no nodules NEURO:  Cranial nerves II through XII grossly intact, motor grossly intact throughout PSYCH:  Cognitively intact, oriented to person place and time   ASSESSMENT/PLAN:    HTN - BP at goal in clinic but labile at home. Prior secondary workup with PCP was very thorough and unremarkable.  Stop Losartan. Start Valsartan 160mg  daily for better 24h coverage. Uptitrate based on BP response. Continue Clonidine 0.3mg  patch. Consider reducing at follow up due to side effect of dry mouth.  Discussed to monitor BP at home at least 2 hours after medications and sitting for 5-10 minutes. Refer to PREP exercise program.   OSA - CPAP compliance encouraged.   HLD, LDL  goal <70 / Aortic atherosclerosis / Family history of cardiovascular disease- Understandably concerned about CAD as her husband recently underwent evaluation. Due to risk factors including family history of CAD, HTN, HLD, and aortic atherosclerosis plan for cardiac CTA. Continue Aspirin 81mg  daily, Atorvastatin 20mg  daily. 06/2023 LDL 62.   Screening for Secondary Hypertension:     Relevant Labs/Studies:    Latest Ref Rng & Units 07/01/2023    8:17 AM 06/15/2023    9:15 AM 05/21/2023   10:38 PM  Basic Labs  Sodium 135 - 146 mmol/L 142  140  141   Potassium 3.5 - 5.3 mmol/L 4.5  4.8  3.9   Creatinine 0.60 - 1.00 mg/dL 1.61  0.96  0.45        Latest Ref Rng & Units 07/01/2023    8:17 AM 06/21/2022    9:13 AM  Thyroid   TSH 0.40 - 4.50 mIU/L 5.54  1.36        Latest Ref Rng & Units 06/13/2023    4:19 PM  Renin/Aldosterone   Aldosterone ___ ng/dL 3               she is interested in enrolling in the PREP exercise and nutrition program through the [facility].     Disposition:    FU with MD/APP/PharmD in 6 weeks    Medication Adjustments/Labs and Tests Ordered: Current medicines are reviewed at length with the patient today.  Concerns regarding medicines are outlined above.  No orders of the defined types were placed in this encounter.  No orders of the defined types were placed in this encounter.    Signed, Alver Sorrow, NP  08/04/2023 3:26 PM    [facility]

## 2023-08-05 ENCOUNTER — Other Ambulatory Visit: Payer: Self-pay | Admitting: Family Medicine

## 2023-08-06 ENCOUNTER — Other Ambulatory Visit: Payer: Self-pay | Admitting: Family Medicine

## 2023-08-06 DIAGNOSIS — I10 Essential (primary) hypertension: Secondary | ICD-10-CM

## 2023-08-06 DIAGNOSIS — E039 Hypothyroidism, unspecified: Secondary | ICD-10-CM

## 2023-08-08 ENCOUNTER — Telehealth: Payer: Self-pay

## 2023-08-08 NOTE — Telephone Encounter (Signed)
 Called JX:BJYN program; explained PREP she would like to start April 22 at [facility], every T/Th at 2pm; will contact week of April 14 to confirm and set up assessment visit.

## 2023-08-08 NOTE — Telephone Encounter (Signed)
 Requested Prescriptions  Pending Prescriptions Disp Refills   atorvastatin (LIPITOR) 20 MG tablet [Pharmacy Med Name: ATORVASTATIN 20MG  TABLETS] 90 tablet 1    Sig: TAKE 1 TABLET BY MOUTH EVERY DAY     There is no refill protocol information for this order

## 2023-08-08 NOTE — Telephone Encounter (Signed)
 Requested Prescriptions  Pending Prescriptions Disp Refills   losartan (COZAAR) 100 MG tablet [Pharmacy Med Name: LOSARTAN 100MG  TABLETS] 90 tablet 0    Sig: TAKE 1 TABLET(100 MG) BY MOUTH DAILY     Cardiovascular:  Angiotensin Receptor Blockers Failed - 08/08/2023  4:57 PM      Failed - Cr in normal range and within 180 days    Creat  Date Value Ref Range Status  07/01/2023 1.06 (H) 0.60 - 1.00 mg/dL Final         Passed - K in normal range and within 180 days    Potassium  Date Value Ref Range Status  07/01/2023 4.5 3.5 - 5.3 mmol/L Final         Passed - Patient is not pregnant      Passed - Last BP in normal range    BP Readings from Last 1 Encounters:  08/04/23 122/69         Passed - Valid encounter within last 6 months    Recent Outpatient Visits           1 month ago Anemia, unspecified type   [place] [facility] Donita Brooks, MD   1 month 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   [place] [facility] Donita Brooks, MD   2 months ago Benign essential HTN   [place] [facility] Tanya Nones, Priscille Heidelberg, MD   3 months ago Shortness of breath   [place] [facility] Tanya Nones, Priscille Heidelberg, MD   10 months ago Tick bite of abdomen, initial encounter   [place] [facility] [place], FNP       Future Appointments             In 5 months McKenzie, Mardene Celeste, MD [facility] [place]             levothyroxine (SYNTHROID) 100 MCG tablet [Pharmacy Med Name: LEVOTHYROXINE 0.100MG  ( ) TAB] 90 tablet 0    Sig: TAKE 1 TABLET(100 MCG) BY MOUTH DAILY BEFORE BREAKFAST     Endocrinology:  Hypothyroid Agents Failed - 08/08/2023  4:57 PM      Failed - TSH in normal range and within 360 days    TSH  Date Value Ref Range Status  07/01/2023 5.54 (H) 0.40 - 4.50 mIU/L Final         Passed - Valid encounter within last 12 months    Recent Outpatient Visits            1 month ago Anemia, unspecified type   [place] [facility] Tanya Nones, Priscille Heidelberg, MD   1 month 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

## 2023-08-11 DIAGNOSIS — M159 Polyosteoarthritis, unspecified: Secondary | ICD-10-CM | POA: Diagnosis not present

## 2023-08-11 DIAGNOSIS — E782 Mixed hyperlipidemia: Secondary | ICD-10-CM | POA: Diagnosis not present

## 2023-08-11 DIAGNOSIS — F322 Major depressive disorder, single episode, severe without psychotic features: Secondary | ICD-10-CM | POA: Diagnosis not present

## 2023-08-11 DIAGNOSIS — E039 Hypothyroidism, unspecified: Secondary | ICD-10-CM | POA: Diagnosis not present

## 2023-08-11 DIAGNOSIS — E6609 Other obesity due to excess calories: Secondary | ICD-10-CM | POA: Diagnosis not present

## 2023-08-11 DIAGNOSIS — I10 Essential (primary) hypertension: Secondary | ICD-10-CM | POA: Diagnosis not present

## 2023-08-11 DIAGNOSIS — Z6832 Body mass index (BMI) 32.0-32.9, adult: Secondary | ICD-10-CM | POA: Diagnosis not present

## 2023-08-11 DIAGNOSIS — E66811 Obesity, class 1: Secondary | ICD-10-CM | POA: Diagnosis not present

## 2023-08-11 DIAGNOSIS — G4733 Obstructive sleep apnea (adult) (pediatric): Secondary | ICD-10-CM | POA: Diagnosis not present

## 2023-08-15 DIAGNOSIS — I10 Essential (primary) hypertension: Secondary | ICD-10-CM | POA: Diagnosis not present

## 2023-08-16 ENCOUNTER — Encounter (HOSPITAL_COMMUNITY): Payer: Self-pay

## 2023-08-16 ENCOUNTER — Encounter (HOSPITAL_BASED_OUTPATIENT_CLINIC_OR_DEPARTMENT_OTHER): Payer: Self-pay

## 2023-08-16 DIAGNOSIS — K2970 Gastritis, unspecified, without bleeding: Secondary | ICD-10-CM | POA: Diagnosis not present

## 2023-08-16 DIAGNOSIS — K5520 Angiodysplasia of colon without hemorrhage: Secondary | ICD-10-CM | POA: Diagnosis not present

## 2023-08-16 DIAGNOSIS — B9681 Helicobacter pylori [H. pylori] as the cause of diseases classified elsewhere: Secondary | ICD-10-CM | POA: Diagnosis not present

## 2023-08-16 DIAGNOSIS — K2930 Chronic superficial gastritis without bleeding: Secondary | ICD-10-CM | POA: Diagnosis not present

## 2023-08-16 DIAGNOSIS — K449 Diaphragmatic hernia without obstruction or gangrene: Secondary | ICD-10-CM | POA: Diagnosis not present

## 2023-08-16 DIAGNOSIS — D509 Iron deficiency anemia, unspecified: Secondary | ICD-10-CM | POA: Diagnosis not present

## 2023-08-16 DIAGNOSIS — R195 Other fecal abnormalities: Secondary | ICD-10-CM | POA: Diagnosis not present

## 2023-08-16 DIAGNOSIS — K648 Other hemorrhoids: Secondary | ICD-10-CM | POA: Diagnosis not present

## 2023-08-16 LAB — BASIC METABOLIC PANEL WITH GFR
BUN/Creatinine Ratio: 22 (ref 12–28)
BUN: 24 mg/dL (ref 8–27)
CO2: 20 mmol/L (ref 20–29)
Calcium: 9.6 mg/dL (ref 8.7–10.3)
Chloride: 106 mmol/L (ref 96–106)
Creatinine, Ser: 1.1 mg/dL — ABNORMAL HIGH (ref 0.57–1.00)
Glucose: 107 mg/dL — ABNORMAL HIGH (ref 70–99)
Potassium: 4.2 mmol/L (ref 3.5–5.2)
Sodium: 143 mmol/L (ref 134–144)
eGFR: 53 mL/min/{1.73_m2} — ABNORMAL LOW (ref >59)

## 2023-08-18 ENCOUNTER — Ambulatory Visit (HOSPITAL_COMMUNITY)
Admission: RE | Admit: 2023-08-18 | Discharge: 2023-08-18 | Disposition: A | Discharge status: Home / Self Care | Source: Ambulatory Visit | Attending: Family | Admitting: Family

## 2023-08-18 ENCOUNTER — Other Ambulatory Visit: Payer: Self-pay | Admitting: Cardiovascular Disease

## 2023-08-18 ENCOUNTER — Ambulatory Visit (HOSPITAL_COMMUNITY)
Admission: RE | Admit: 2023-08-18 | Discharge: 2023-08-18 | Disposition: A | Discharge status: Home / Self Care | Source: Ambulatory Visit | Attending: Cardiovascular Disease

## 2023-08-18 DIAGNOSIS — R931 Abnormal findings on diagnostic imaging of heart and coronary circulation: Secondary | ICD-10-CM

## 2023-08-18 DIAGNOSIS — I10 Essential (primary) hypertension: Secondary | ICD-10-CM | POA: Insufficient documentation

## 2023-08-18 DIAGNOSIS — Z8249 Family history of ischemic heart disease and other diseases of the circulatory system: Secondary | ICD-10-CM | POA: Insufficient documentation

## 2023-08-18 DIAGNOSIS — B9681 Helicobacter pylori [H. pylori] as the cause of diseases classified elsewhere: Secondary | ICD-10-CM | POA: Diagnosis not present

## 2023-08-18 DIAGNOSIS — E782 Mixed hyperlipidemia: Secondary | ICD-10-CM | POA: Diagnosis not present

## 2023-08-18 DIAGNOSIS — K2930 Chronic superficial gastritis without bleeding: Secondary | ICD-10-CM | POA: Diagnosis not present

## 2023-08-18 DIAGNOSIS — I700 Atherosclerosis of aorta: Secondary | ICD-10-CM | POA: Diagnosis not present

## 2023-08-18 DIAGNOSIS — I2510 Atherosclerotic heart disease of native coronary artery without angina pectoris: Secondary | ICD-10-CM | POA: Diagnosis not present

## 2023-08-18 MED ORDER — IOHEXOL 350 MG/ML SOLN
95.0000 mL | Freq: Once | INTRAVENOUS | Status: AC | PRN
  Administered 2023-08-18: 95 mL via INTRAVENOUS

## 2023-08-18 MED ORDER — NITROGLYCERIN 0.4 MG SL SUBL
SUBLINGUAL_TABLET | SUBLINGUAL | Status: AC
  Filled 2023-08-18: qty 2

## 2023-08-18 MED ORDER — NITROGLYCERIN 0.4 MG SL SUBL
0.8000 mg | SUBLINGUAL_TABLET | Freq: Once | SUBLINGUAL | Status: AC
  Administered 2023-08-18: 0.8 mg via SUBLINGUAL

## 2023-08-18 NOTE — Progress Notes (Signed)
 FFR CT ordered.  Jean Baldwin T. Flora Lipps, MD, [facility]  [facility]  [address] [place], [place] [address] [phone]  7:52 PM

## 2023-08-19 ENCOUNTER — Encounter (HOSPITAL_BASED_OUTPATIENT_CLINIC_OR_DEPARTMENT_OTHER): Payer: Self-pay

## 2023-08-22 ENCOUNTER — Telehealth: Payer: Self-pay

## 2023-08-22 NOTE — Telephone Encounter (Signed)
 Called to confirm participation in PREP class on April 22, assessment visit scheduled for 4/21 at 12 noon.

## 2023-08-29 NOTE — Progress Notes (Signed)
 [facility] Evaluation  Patient Details  Name: Krystyna Cleckley MRN: [id number] Date of Birth: [birth year] Age: 73 y.o. PCP: Austine Lefort, MD  Vitals:   08/29/23 1234  BP: (!) 144/68  Pulse: 78  SpO2: 97%  Weight: 171 lb (77.6 kg)     [facility] Eval - 08/29/23 1200       [facility] "PREP" Location   [facility] "PREP" Location [facility]      Referral    Referring Provider Walker    Reason for referral Hypertension;High Cholesterol    Program Start Date 09/06/23      Measurement   Waist Circumference 37.5 inches    Hip Circumference 46 inches      Information for Trainer   Goals --   Better BP control. 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 3    I have no trouble taking out the trash. 4    I do housework such as vacuuming and dusting on my own without difficulty. 4    I can easily lift a gallon of milk (8lbs). 4    I can easily walk a mile. 2    I have no trouble reaching into high cupboards or reaching down to pick up something from the floor. 4    I do not have trouble doing out-door work such as Loss adjuster, chartered, raking leaves, or gardening. 4      Mobility and Daily Activities   I feel younger than my age. 4    I feel independent. 4    I feel energetic. 2    I live an active life.  4    I feel strong. 2    I feel healthy. 2    I feel active as other people my age. 4      How fit and strong are you.   Fit and Strong Total Score 47            Past Medical History:  Diagnosis Date   Allergy    Arthritis    knees   Cataract    Dental crowns present    High cholesterol    Hypertension    states under control with meds., has been on med > 20 yr.    Hypothyroidism    Osteopenia    Sleep apnea    TFCC (triangular fibrocartilage complex) tear 03/2017   right wrist/wrist   Past Surgical History:  Procedure Laterality Date   CESAREAN SECTION     JOINT REPLACEMENT     KNEE ARTHROSCOPY W/ MENISCAL REPAIR Left    NASAL SEPTUM SURGERY     TOTAL HIP ARTHROPLASTY Right 07/12/2017   Procedure: RIGHT TOTAL HIP ARTHROPLASTY ANTERIOR APPROACH;  Surgeon: Arnie Lao, MD;  [facility];  Service: Orthopedics;  Laterality: Right;   TUBAL LIGATION     WRIST ARTHROSCOPY WITH DEBRIDEMENT Right 04/12/2017   Procedure: RIGHT WRIST ARTHROSCOPY WITH DEBRIDEMENT;  Surgeon: Lyanne Sample, MD;  Location: [facility];  Service: Orthopedics;  Laterality:  Right;   Social History   Tobacco Use  Smoking Status Never  Smokeless Tobacco Never  To begin PREP program at [facility] on April 29, every T/Th 2-3:15  Jim Motts 08/29/2023, 12:46 PM

## 2023-08-30 ENCOUNTER — Ambulatory Visit
Admission: RE | Admit: 2023-08-30 | Discharge: 2023-08-30 | Disposition: A | Discharge status: Home / Self Care | Source: Ambulatory Visit | Attending: Family Medicine | Admitting: Family Medicine

## 2023-08-30 DIAGNOSIS — Z1231 Encounter for screening mammogram for malignant neoplasm of breast: Secondary | ICD-10-CM

## 2023-08-31 DIAGNOSIS — M19072 Primary osteoarthritis, left ankle and foot: Secondary | ICD-10-CM | POA: Diagnosis not present

## 2023-08-31 DIAGNOSIS — M19071 Primary osteoarthritis, right ankle and foot: Secondary | ICD-10-CM | POA: Diagnosis not present

## 2023-09-06 NOTE — Progress Notes (Signed)
 [facility] PRE[facility] Weekly Session  Patient Details  Name: Jean Baldwin MRN: [id number] Date of Birth: 13-Jul-1950 Age: 73 y.o. PCP: Austine Lefort, MD  There were no vitals filed for this visit.   [facility] Weekly seesion - 09/06/23 1500       [facility] "PREP" Location   [facility] "PREP" Location [facility]      Weekly Session   Topic Discussed Goal setting and welcome to the program   Introductions, reivew of notebook, tour of facility, offered option to work out on cardio machine   Classes attended to date 1             Jim Motts 09/06/2023, 3:31 PM

## 2023-09-13 NOTE — Progress Notes (Signed)
  [facility] PREP Weekly Session  Patient Details  Name: Zimal Biedron MRN: [id number] Date of Birth: 04/05/1951 Age: 73 y.o. PCP: Austine Lefort, MD  Vitals:   09/13/23 1523  Weight: 166 lb 9.6 oz (75.6 kg)     [facility] Weekly seesion - 09/13/23 1500       [facility] "PREP" Location   [facility] "PREP[facility] Location [facility]      Weekly Session   Topic Discussed Importance of resistance training;Other ways to be active   Goal: work up to 150 min of cardio/wk and strength training, start at 2, work up to 3, for 20-40 minutes   Classes attended to date 3             [place] Sloop B Orian Amberg 09/13/2023, 3:24 PM

## 2023-09-20 ENCOUNTER — Encounter: Payer: Self-pay | Admitting: Family Medicine

## 2023-09-20 ENCOUNTER — Ambulatory Visit: Admitting: Family Medicine

## 2023-09-20 ENCOUNTER — Ambulatory Visit: Payer: Self-pay

## 2023-09-20 VITALS — BP 140/60 | HR 103 | Temp 98.5°F | Ht 62.0 in | Wt 167.4 lb

## 2023-09-20 DIAGNOSIS — M542 Cervicalgia: Secondary | ICD-10-CM | POA: Diagnosis not present

## 2023-09-20 DIAGNOSIS — I10 Essential (primary) hypertension: Secondary | ICD-10-CM

## 2023-09-20 MED ORDER — TIZANIDINE HCL 4 MG PO TABS: 4.0000 mg | ORAL_TABLET | Freq: Three times a day (TID) | ORAL | 1 refills | Status: DC | PRN

## 2023-09-20 NOTE — Progress Notes (Signed)
 Patient Office Visit  Assessment & Plan:  Neck pain on left side -     tiZANidine HCl; Take 1 tablet (4 mg total) by mouth every 8 (eight) hours as needed for muscle spasms.  Dispense: 30 tablet; Refill: 1  Primary hypertension   Assessment and Plan    Neck pain with muscle stiffness Acute left-sided neck pain with muscle spasm, no trauma history. Pain affects heart rate and blood pressure. - Prescribed Zanaflex every 6 to 8 hours as needed for muscle relaxation. - Advised against early stretching. - No imaging required.  Hypertension Blood pressure elevated at 172/68, possibly influenced by pain. Current medications: valsartan , hydrochlorothiazide , clonidine  patch. History of unstable control since November.  H. pylori infection Completed antibiotic treatment for H. pylori infection today.  Arthritis Significant foot pain due to bone-on-bone contact. Tramadol  used for pain management. Avoidance of anti-inflammatories due to side effects and blood pressure concerns.  Hip replacement Hip replacement in 2018 with no current issues.        No follow-ups on file.   Subjective:     Patient ID: Jean Baldwin, female    DOB: [birth year]  Age: 73 y.o. MRN: [id number]  Chief Complaint  Patient presents with   Neck Pain    Bilateral neck stiffness x 2 days. Pt states it is now mostly on the L side.     HPI Discussed the use of AI scribe software for clinical note transcription with the patient, who gave verbal consent to proceed.  History of Present Illness   Jean Baldwin is a 73 year old female with hypertension who presents with acute neck pain.  Her neck pain began on Sunday while preparing for church and worsened throughout the day, which was filled with church and family activities. By Monday, she experienced significant immobility and used a heating pad for relief. The pain initially affected the base of her skull but has improved today. She reports stiffness  and soreness, particularly on the left side, and is unable to turn her head while driving, necessitating her daughter to drive her.  She has a history of hypertension with fluctuating blood pressure since November. Her current medications include valsartan , which was switched from losartan  by her cardiologist, and a clonidine  patch, though she feels it may not be effective as she no longer experiences dry mouth. Her blood pressure tends to rise during the day, with a recent reading of 172/68.  She has a history of arthritis, including 'bone on bone' in both feet, and has been prescribed tramadol  for pain management. She previously used meloxicam but discontinued it due to concerns about kidney issues and reflux. She is allergic to codeine and has not used muscle relaxants since a hip replacement in 2018.  She is currently completing a course of antibiotics for H. pylori, which was diagnosed via endoscopy. No recent trauma or falls.     Physical Exam NECK: Neck stiffness with limited range of motion and tight muscles. CARDIOVASCULAR: Tachycardia. Results DIAGNOSTIC Endoscopy: Positive for H. Pylori Assessment & Plan Neck pain with muscle stiffness Acute left-sided neck pain with muscle spasm, no trauma history. Pain affects heart rate and blood pressure. Patient could not drive herself here today - Prescribed Zanaflex every 6 to 8 hours as needed for muscle relaxation. - Advised against early stretching. - No imaging required.  Hypertension Blood pressure elevated at 172/68, possibly influenced by pain. Current medications: valsartan , hydrochlorothiazide , clonidine  patch. History of unstable control since November.  H. pylori infection Completed antibiotic treatment for H. pylori infection today.  Arthritis Significant foot pain due to bone-on-bone contact. Tramadol  used for pain management. Avoidance of anti-inflammatories due to side effects and blood pressure concerns.  Hip  replacement Hip replacement in 2018 with no current issues.     The 10-year ASCVD risk score (Arnett DK, et al., 2019) is: 19%  Past Medical History:  Diagnosis Date   Allergy    Arthritis    knees   Cataract    Dental crowns present    High cholesterol    Hypertension    states under control with meds., has been on med > 20 yr.   Hypothyroidism    Osteopenia    Sleep apnea    TFCC (triangular fibrocartilage complex) tear 03/2017   right wrist/wrist   Past Surgical History:  Procedure Laterality Date   CESAREAN SECTION     JOINT REPLACEMENT     KNEE ARTHROSCOPY W/ MENISCAL REPAIR Left    NASAL SEPTUM SURGERY     TOTAL HIP ARTHROPLASTY Right 07/12/2017   Procedure: RIGHT TOTAL HIP ARTHROPLASTY ANTERIOR APPROACH;  Surgeon: Arnie Lao, MD;  Location: [facility];  Service: Orthopedics;  Laterality: Right;   TUBAL LIGATION     WRIST ARTHROSCOPY WITH DEBRIDEMENT Right 04/12/2017   Procedure: RIGHT WRIST ARTHROSCOPY WITH DEBRIDEMENT;  Surgeon: Lyanne Sample, MD;  Location: [facility];  Service: Orthopedics;  Laterality: Right;   Social History   Tobacco Use   Smoking status: Never   Smokeless tobacco: Never  Vaping Use   Vaping status: Never Used  Substance Use Topics   Alcohol use: No   Drug use: No   Family History  Problem Relation Age of Onset   Emphysema Father    Heart disease Father    Other Father        Pacemaker   Heart disease Maternal Grandfather        fatal MI   Breast cancer Maternal Aunt    Allergies  Allergen Reactions   Sulfamethoxazole Other (See Comments)   Codeine Nausea And Vomiting   Sulfa Antibiotics Rash    ROS    Objective:    BP (!) 140/60   Pulse (!) 103   Temp 98.5 F (36.9 C)   Ht 5\' 2"  (1.575 m)   Wt 167 lb 6 oz (75.9 kg)   SpO2 99%   BMI 30.61 kg/m  BP Readings from Last 3 Encounters:  09/20/23 (!) 140/60  08/29/23 (!) 144/68  08/18/23 127/69   Wt Readings from Last 3 Encounters:   09/20/23 167 lb 6 oz (75.9 kg)  09/13/23 166 lb 9.6 oz (75.6 kg)  08/29/23 171 lb (77.6 kg)    Physical Exam Vitals and nursing note reviewed.  Constitutional:      Appearance: Normal appearance.  HENT:     Head: Normocephalic.     Right Ear: Tympanic membrane, ear canal and external ear normal.     Left Ear: Tympanic membrane, ear canal and external ear normal.  Eyes:     Extraocular Movements: Extraocular movements intact.     Conjunctiva/sclera: Conjunctivae normal.     Pupils: Pupils are equal, round, and reactive to light.  Cardiovascular:     Rate and Rhythm: Normal rate and regular rhythm.     Heart sounds: Normal heart sounds.  Pulmonary:     Effort: Pulmonary effort is normal.     Breath sounds: Normal breath sounds.  Musculoskeletal:  Cervical back: Spasms and tenderness present. Pain with movement present. Decreased range of motion.     Right lower leg: No edema.     Left lower leg: No edema.     Comments: Patient cannot turn to the right or left.  Patient has discomfort with forward flexion.  Patient has tenderness over the occipital area.  Neurological:     General: No focal deficit present.     Mental Status: She is alert and oriented to person, place, and time.  Psychiatric:        Mood and Affect: Mood normal.        Behavior: Behavior normal.        Thought Content: Thought content normal.        Judgment: Judgment normal.      No results found for any visits on 09/20/23.

## 2023-09-20 NOTE — Telephone Encounter (Signed)
 Chief Complaint: neck stiffness Symptoms: pain, stiffness Frequency: since Sunday Pertinent Negatives: Patient denies fever, headache, CP, SOB, recent tick bite, neck swelling Disposition: [] ED /[] Urgent Care (no appt availability in office) / [x] Appointment(In office/virtual)/ []  [facility]/ [] Home Care/ [] Refused Recommended Disposition /[] [facility]/ []  Follow-up with PCP Additional Notes: Jean Baldwin reports neck pain and stiffness since Sunday. Jean Baldwin states it started gradually while she was getting dressed and has worsened since. Jean Baldwin rates the pain 8/10 and states pain is worse on the L side. Jean Baldwin is able to turn her head from side to side but states it is painful. Jean Baldwin states she cannot touch her chin all the way to her chest. Jean Baldwin denies headache. Denies fever. Denies CP, SOB, recent tick bite, neck swelling. Jean Baldwin states Thursday she went to an exercise class but did not overexert or injury herself to her memory. Jean Baldwin states she has taken Tramadol  that was prescribed for arthritic foot pain. Jean Baldwin states she is unsure if it helped ease her pain, but is curious if she can take it with her other medications. Jean Baldwin states she is currently taking 6 antibiotics for 2 wks for H. Pylori and has one day of antibiotics left. RN advised Jean Baldwin she should be seen for her symptoms. RN scheduled Jean Baldwin for today at 1015, Jean Baldwin agreeable to that plan and states her daughter lives across the street and can take her.     Copied from CRM [id number]. Topic: Clinical - Red Word Triage >> Sep 20, 2023  9:44 AM Jean Baldwin wrote: Red Word that prompted transfer to Nurse Triage: On 6 antibiotics for 14 days, and now she has a stiff neck. It's very painful to turn her neck on both sides. The left side is the worst. She was attempting to take Lidocaine  and tramadol  but want to consult with doctor first before trying those meds. Reason for Disposition  [1] SEVERE neck pain (e.g., excruciating, unable to do any normal activities) AND [2]  not improved after 2 hours of pain medicine  Answer Assessment - Initial Assessment Questions 1. ONSET: "When did the pain begin?"      Sunday as she was getting dressed to go to church, got worse as the day progressed. Yesterday she could not move her head. Today she can move it side to side but it's very painful and she can't move her head a lot. 2. LOCATION: "Where does it hurt?"      Neck pain and stiffness, bilateral, worse on the L side  3. PATTERN "Does the pain come and go, or has it been constant since it started?"      Constant  4. SEVERITY: "How bad is the pain?"  (Scale 1-10; or mild, moderate, severe)   - NO PAIN (0): no pain or only slight stiffness    - MILD (1-3): doesn't interfere with normal activities    - MODERATE (4-7): interferes with normal activities or awakens from sleep    - SEVERE (8-10):  excruciating pain, unable to do any normal activities      8-9/10 "it's pretty bad" 5. RADIATION: "Does the pain go anywhere else, shoot into your arms?"     Into back of her head 6. CORD SYMPTOMS: "Any weakness or numbness of the arms or legs?"     Denies  7. CAUSE: "What do you think is causing the neck pain?"     Not sure  8. NECK OVERUSE: "Any recent activities that involved turning or twisting  the neck?"     "A little bit of yard work on Saturday" 9. OTHER SYMPTOMS: "Do you have any other symptoms?" (e.g., headache, fever, chest pain, difficulty breathing, neck swelling)     Took a Tramadol  Sunday and has used a heating pad. Tramadol  is for foot pain and arthritis. Does not know if it worked. State she was busy that day. Diagnosed with H. Pylori, taking 6 antibiotics for 2 wks. Takes the last 6 pills today. Supposed to go to a BP class today but can't drive (can't turn her head). Unable to touch her chin to her chest. Denies recent tick bite. Denies fever. Denies headache "just all this pain up the back of my neck and to my head." Denies neck swelling "I don't think so."  Curious if tramadol  and lidocaine  patches would interfere with her other medications.  Jean Baldwin states she does exercises in her class. Last went to class Thursday. On Saturday she felt like her R shoulder blade was "sensitive." Jean Baldwin denies overexerting herself at class. R shoulder pain is gone.  164/68 at 0930 - taking BP meds. Also wears a clonidine  patch  Protocols used: Neck Pain or Stiffness-A-AH

## 2023-09-28 ENCOUNTER — Ambulatory Visit (HOSPITAL_BASED_OUTPATIENT_CLINIC_OR_DEPARTMENT_OTHER): Admitting: Family

## 2023-09-28 VITALS — BP 124/65 | HR 94 | Ht 62.0 in | Wt 166.4 lb

## 2023-09-28 DIAGNOSIS — G4733 Obstructive sleep apnea (adult) (pediatric): Secondary | ICD-10-CM

## 2023-09-28 DIAGNOSIS — I10 Essential (primary) hypertension: Secondary | ICD-10-CM

## 2023-09-28 DIAGNOSIS — M1990 Unspecified osteoarthritis, unspecified site: Secondary | ICD-10-CM | POA: Diagnosis not present

## 2023-09-28 MED ORDER — CLONIDINE 0.2 MG/24HR TD PTWK
0.2000 mg | MEDICATED_PATCH | TRANSDERMAL | 1 refills | Status: DC
Start: 2023-09-28 — End: 2023-11-01

## 2023-09-28 MED ORDER — VALSARTAN 320 MG PO TABS: 320.0000 mg | ORAL_TABLET | Freq: Every day | ORAL | 1 refills | Status: DC

## 2023-09-28 NOTE — Progress Notes (Signed)
 Advanced Hypertension Clinic Assessment:    Date:  09/28/2023   ID:  Jean Baldwin, DOB Sep 07, 1950, MRN [id number]  PCP:  Austine Lefort, MD  Cardiologist:  None  Nephrologist:  Referring MD: Austine Lefort, MD   CC: Hypertension  History of Present Illness:    Jean Baldwin is a 73 y.o. female with a hx of hypertension, hypothyroidism, OSA, HLD, nonobstructive CAD. Here to follow up in the Advanced Hypertension Clinic. She requested cardiology evaluation due to family history as her husband also follows with our practice. Family history notable for father with PPM, and maternal grandfather with fatal MI.   Jean Baldwin was diagnosed with hypertension in her 20-30s.  Had to take medications during pregnancy but reports she did not have preeclampsia. No prior tobacco use. At her initial Advanced Hypertension Clinic visit Losartan  transitioned to Valsartan . Cardiac CTA due to family history of CAD performed 08/18/23 calcium  score 475 placing her in 88th percentile with moderate mixed plaque in first diagonal (50-69%), mild pLAD (25-49%), mild pLCx (25-49%), mid RCA <25%.   Prior secondary workup: 24h metanephrines 06/15/23 were normal.  05/2023 renal arteries no stenosis 06/13/23 labs not consistent with hyperaldosteronism  Presents today for follow up with her daughter. Notes at time of routine EGD diagnosed with H.pylori and recently completed treatment. Her arthirits has flared and significantly limiting her activity and causing pain leading to elevated BP readings at home. Prior dry mouth with clonidine  has improved. BP at home has been 150/63, 137/66, 126/57, 146/53, 160/61, 142/62, 156/55, 141/59 with upper arm cuff not yet checked for accuracy.  She stopped the meloxicam due to renal and hypertension concerns which coincides with timing of increased pain. She has seen a chiropractor and started turmeric with mild improvement. No chest pain, exertional dyspnea.   Previous  antihypertensives: Nebivolol  - ineffective Spironolactone  Amlodipine  - LE edema Clonidine  tablet - changed to patch   Past Medical History:  Diagnosis Date   Allergy    Arthritis    knees   Cataract    Dental crowns present    High cholesterol    Hypertension    states under control with meds., has been on med > 20 yr.   Hypothyroidism    Osteopenia    Sleep apnea    TFCC (triangular fibrocartilage complex) tear 03/2017   right wrist/wrist    Past Surgical History:  Procedure Laterality Date   CESAREAN SECTION     JOINT REPLACEMENT     KNEE ARTHROSCOPY W/ MENISCAL REPAIR Left    NASAL SEPTUM SURGERY     TOTAL HIP ARTHROPLASTY Right 07/12/2017   Procedure: RIGHT TOTAL HIP ARTHROPLASTY ANTERIOR APPROACH;  Surgeon: Arnie Lao, MD;  Location: [facility];  Service: Orthopedics;  Laterality: Right;   TUBAL LIGATION     WRIST ARTHROSCOPY WITH DEBRIDEMENT Right 04/12/2017   Procedure: RIGHT WRIST ARTHROSCOPY WITH DEBRIDEMENT;  Surgeon: Lyanne Sample, MD;  Location: [facility];  Service: Orthopedics;  Laterality: Right;    Current Medications: No outpatient medications have been marked as taking for the 09/28/23 encounter (Appointment) with Clearnce Curia, NP.     Allergies:   Sulfamethoxazole, Codeine, and Sulfa antibiotics   Social History   Socioeconomic History   Marital status: Married    Spouse name: Not on file   Number of children: 2   Years of education: Not on file   Highest education level: 12th grade  Occupational History   Not  on file  Tobacco Use   Smoking status: Never   Smokeless tobacco: Never  Vaping Use   Vaping status: Never Used  Substance and Sexual Activity   Alcohol use: No   Drug use: No   Sexual activity: Yes  Other Topics Concern   Not on file  Social History Narrative   Not on file   Social Drivers of Health   Financial Resource Strain: Low Risk  (06/13/2023)   Overall Financial Resource Strain (CARDIA)     Difficulty of Paying Living Expenses: Not hard at all  Food Insecurity: No Food Insecurity (06/13/2023)   Hunger Vital Sign    Worried About Running Out of Food in the Last Year: Never true    Ran Out of Food in the Last Year: Never true  Transportation Needs: No Transportation Needs (06/13/2023)   PRAPARE - Administrator, Civil Service (Medical): No    Lack of Transportation (Non-Medical): No  Physical Activity: Unknown (06/13/2023)   Exercise Vital Sign    Days of Exercise per Week: 0 days    Minutes of Exercise per Session: Not on file  Stress: No Stress Concern Present (06/13/2023)   [employer] of Occupational Health - Occupational Stress Questionnaire    Feeling of Stress : Only a little  Social Connections: Socially Integrated (06/13/2023)   Social Connection and Isolation Panel [NHANES]    Frequency of Communication with Friends and Family: More than three times a week    Frequency of Social Gatherings with Friends and Family: More than three times a week    Attends Religious Services: More than 4 times per year    Active Member of [employer] or Organizations: Yes    Attends Engineer, structural: More than 4 times per year    Marital Status: Married     Family History: The patient's family history includes Breast cancer in her maternal aunt; Emphysema in her father; Heart disease in her father and maternal grandfather; Other in her father.  ROS:   Please see the history of present illness.     All other systems reviewed and are negative.  EKGs/Labs/Other Studies Reviewed:         Recent Labs: 07/01/2023: ALT 11; Hemoglobin 11.0; Platelets 179; TSH 5.54 08/15/2023: BUN 24; Creatinine, Ser 1.10; Potassium 4.2; Sodium 143   Recent Lipid Panel    Component Value Date/Time   CHOL 134 07/01/2023 0817   TRIG 118 07/01/2023 0817   HDL 52 07/01/2023 0817   CHOLHDL 2.6 07/01/2023 0817   LDLCALC 62 07/01/2023 0817    Physical Exam:   VS:  There were no vitals  taken for this visit. , BMI There is no height or weight on file to calculate BMI. GENERAL:  Well appearing HEENT: Pupils equal round and reactive, fundi not visualized, oral mucosa unremarkable NECK:  No jugular venous distention, waveform within normal limits, carotid upstroke brisk and symmetric, no bruits, no thyromegaly LYMPHATICS:  No cervical adenopathy LUNGS:  Clear to auscultation bilaterally HEART:  RRR.  PMI not displaced or sustained,S1 and S2 within normal limits, no S3, no S4, no clicks, no rubs, no murmurs ABD:  Flat, positive bowel sounds normal in frequency in pitch, no bruits, no rebound, no guarding, no midline pulsatile mass, no hepatomegaly, no splenomegaly EXT:  2 plus pulses throughout, no edema, no cyanosis no clubbing SKIN:  No rashes no nodules NEURO:  Cranial nerves II through XII grossly intact, motor grossly intact throughout PSYCH:  Cognitively intact, oriented to person place and time   ASSESSMENT/PLAN:    HTN - BP at goal in clinic but labile at home. Prior secondary workup with PCP was very thorough and unremarkable.  Increase Valsartan  to 320mg  daily Reduce Clonidine  from 0.3mg  to 0.2mg  patch. Consider further reducing at follow up  Discussed to monitor BP at home at least 2 hours after medications and sitting for 5-10 minutes. Nurse visit in a week to assess accuracy of home BP cuff  Arthritis pain - Recent flare leading to significant pain, escalation in BP, and limited activity. At this point, benefits of Meloxicam outweigh risks but will aim to use sparingly. Resume meloxicam 15mg  QHS and after 1 week reduce to every other day. BMET in 7-10 days for monitoring. Recommend Tylenol  Arthritis BID for additional pain control benefit. She has Tramadol  for breakthrough pain. Refer to water PT as anticipate will be better tolerated than PREP at this time. Once water PT completed, consider re-enrollment in PREP.  OSA - CPAP compliance encouraged.    Nonobstructive CAD / HLD, LDL goal <70 / Aortic atherosclerosis / Family history of cardiovascular disease- Stable with no anginal symptoms. No indication for ischemic evaluation.   Continue Aspirin  81mg  daily, Atorvastatin  20mg  daily. 06/2023 LDL 62.   Screening for Secondary Hypertension:     Relevant Labs/Studies:    Latest Ref Rng & Units 08/15/2023    8:45 AM 07/01/2023    8:17 AM 06/15/2023    9:15 AM  Basic Labs  Sodium 134 - 144 mmol/L 143  142  140   Potassium 3.5 - 5.2 mmol/L 4.2  4.5  4.8   Creatinine 0.57 - 1.00 mg/dL 1.61  0.96  0.45        Latest Ref Rng & Units 07/01/2023    8:17 AM 06/21/2022    9:13 AM  Thyroid    TSH 0.40 - 4.50 mIU/L 5.54  1.36        Latest Ref Rng & Units 06/13/2023    4:19 PM  Renin/Aldosterone   Aldosterone ___ ng/dL 3              Disposition:    Nurse visit in 7-10 days to assess BP cuff accuracy FU with MD/APP/PharmD in 2-3 mos   Medication Adjustments/Labs and Tests Ordered: Current medicines are reviewed at length with the patient today.  Concerns regarding medicines are outlined above.  No orders of the defined types were placed in this encounter.  No orders of the defined types were placed in this encounter.    Signed, Clearnce Curia, NP  09/28/2023 9:57 AM    [facility]

## 2023-09-28 NOTE — Patient Instructions (Addendum)
 Medication Instructions:  CHANGE Valsartan  to 320mg  daily  REDUCE Clonidine  to 0.2mg  patch  Okay to resume Meloxicam  Could add CoQ10 if you wish about 2 weeks after resuming the Meloxicam to see if it helps with inflammation and pain  May take Tylenol  Arthritis 2 tablets up to 3 times per day  Suggested schedule:  AM: Tylenol  2 tabs Midday: Tramadol  Evening: Meloxicam and Tylenol  If after 1-2 weeks your pain is improving, you could try reducing Meloxicam to every other day   Labwork: Your physician recommends that you return for lab work in 7-10 days for BMET   Follow-Up: Nurse visit in 7-10 days  to check accuracy of blood pressure cuff. Have your blood work that day.   Please follow up in 2-3 months in [facility]V HTN CLINIC with Dr. Theodis Fiscal, Neomi Banks, NP or Donivan Furry PharmD

## 2023-09-29 ENCOUNTER — Encounter (HOSPITAL_BASED_OUTPATIENT_CLINIC_OR_DEPARTMENT_OTHER): Payer: Self-pay | Admitting: Family

## 2023-10-04 DIAGNOSIS — E039 Hypothyroidism, unspecified: Secondary | ICD-10-CM | POA: Diagnosis not present

## 2023-10-04 DIAGNOSIS — I10 Essential (primary) hypertension: Secondary | ICD-10-CM | POA: Diagnosis not present

## 2023-10-04 DIAGNOSIS — M159 Polyosteoarthritis, unspecified: Secondary | ICD-10-CM | POA: Diagnosis not present

## 2023-10-04 DIAGNOSIS — Z6829 Body mass index (BMI) 29.0-29.9, adult: Secondary | ICD-10-CM | POA: Diagnosis not present

## 2023-10-04 DIAGNOSIS — F322 Major depressive disorder, single episode, severe without psychotic features: Secondary | ICD-10-CM | POA: Diagnosis not present

## 2023-10-04 DIAGNOSIS — E663 Overweight: Secondary | ICD-10-CM | POA: Diagnosis not present

## 2023-10-04 DIAGNOSIS — G4733 Obstructive sleep apnea (adult) (pediatric): Secondary | ICD-10-CM | POA: Diagnosis not present

## 2023-10-04 DIAGNOSIS — E782 Mixed hyperlipidemia: Secondary | ICD-10-CM | POA: Diagnosis not present

## 2023-10-06 ENCOUNTER — Encounter: Payer: Self-pay | Admitting: Family Medicine

## 2023-10-07 ENCOUNTER — Other Ambulatory Visit: Payer: Self-pay | Admitting: Family Medicine

## 2023-10-07 ENCOUNTER — Ambulatory Visit (HOSPITAL_BASED_OUTPATIENT_CLINIC_OR_DEPARTMENT_OTHER)

## 2023-10-07 DIAGNOSIS — I10 Essential (primary) hypertension: Secondary | ICD-10-CM | POA: Diagnosis not present

## 2023-10-07 MED ORDER — ESCITALOPRAM OXALATE 10 MG PO TABS: 10.0000 mg | ORAL_TABLET | Freq: Every day | ORAL | 3 refills | Status: DC

## 2023-10-07 NOTE — Patient Instructions (Signed)
 Medication Instructions:  Your physician recommends that you continue on your current medications as directed. Please refer to the Current Medication list given to you today.    Follow-Up: Follow up as scheduled.   Special Instructions:  Your blood pressure cuff is ranging 20 points higher.  Please ensure to either change your blood pressure cuff's batteries or buy a new blood pressure machine.

## 2023-10-07 NOTE — Progress Notes (Signed)
   Nurse Visit   Date of Encounter: 10/07/2023 ID: Jean Baldwin, DOB 09/05/1950, MRN [id number]  PCP:  Austine Lefort, MD   [facility] Providers Cardiologist:  None Cardiology APP:  Clearnce Curia, NP      Visit Details   VS:  BP (!) 160/71 (BP Location: Left Arm, Patient Position: Sitting, Cuff Size: Normal) Comment: Home BP cuff , BMI There is no height or weight on file to calculate BMI.  Wt Readings from Last 3 Encounters:  09/28/23 166 lb 6.4 oz (75.5 kg)  09/20/23 167 lb 6 oz (75.9 kg)  09/13/23 166 lb 9.6 oz (75.6 kg)     Reason for visit: BP Check Performed today: Vitals and Provider consulted:Caitlin Walker, NP Changes (medications, testing, etc.) : None Length of Visit: 15 minutes    Medications Adjustments/Labs and Tests Ordered: No orders of the defined types were placed in this encounter.  No orders of the defined types were placed in this encounter.    Signed, Breyanna Valera M Monti Jilek, LPN  7/82/9562 1:30 PM

## 2023-10-08 LAB — BASIC METABOLIC PANEL WITH GFR
BUN/Creatinine Ratio: 28 (ref 12–28)
BUN: 26 mg/dL (ref 8–27)
CO2: 20 mmol/L (ref 20–29)
Calcium: 9.8 mg/dL (ref 8.7–10.3)
Chloride: 104 mmol/L (ref 96–106)
Creatinine, Ser: 0.94 mg/dL (ref 0.57–1.00)
Glucose: 103 mg/dL — ABNORMAL HIGH (ref 70–99)
Potassium: 5 mmol/L (ref 3.5–5.2)
Sodium: 143 mmol/L (ref 134–144)
eGFR: 64 mL/min/{1.73_m2} (ref >59)

## 2023-10-09 ENCOUNTER — Ambulatory Visit (HOSPITAL_BASED_OUTPATIENT_CLINIC_OR_DEPARTMENT_OTHER): Payer: Self-pay | Admitting: Family

## 2023-10-16 ENCOUNTER — Other Ambulatory Visit: Payer: Self-pay | Admitting: Family Medicine

## 2023-10-16 DIAGNOSIS — I10 Essential (primary) hypertension: Secondary | ICD-10-CM

## 2023-10-26 ENCOUNTER — Other Ambulatory Visit: Payer: Medicare PPO

## 2023-10-26 DIAGNOSIS — E7800 Pure hypercholesterolemia, unspecified: Secondary | ICD-10-CM

## 2023-10-26 DIAGNOSIS — E039 Hypothyroidism, unspecified: Secondary | ICD-10-CM

## 2023-10-26 DIAGNOSIS — I10 Essential (primary) hypertension: Secondary | ICD-10-CM

## 2023-10-26 DIAGNOSIS — M545 Low back pain, unspecified: Secondary | ICD-10-CM

## 2023-10-26 LAB — COMPLETE METABOLIC PANEL WITHOUT GFR
AG Ratio: 1.9 (calc) (ref 1.0–2.5)
ALT: 12 U/L (ref 6–29)
AST: 13 U/L (ref 10–35)
Albumin: 3.9 g/dL (ref 3.6–5.1)
Alkaline phosphatase (APISO): 84 U/L (ref 37–153)
BUN/Creatinine Ratio: 29 (calc) — ABNORMAL HIGH (ref 6–22)
BUN: 29 mg/dL — ABNORMAL HIGH (ref 7–25)
CO2: 28 mmol/L (ref 20–32)
Calcium: 9.6 mg/dL (ref 8.6–10.4)
Chloride: 104 mmol/L (ref 98–110)
Creat: 1 mg/dL (ref 0.60–1.00)
Globulin: 2.1 g/dL (ref 1.9–3.7)
Glucose, Bld: 108 mg/dL — ABNORMAL HIGH (ref 65–99)
Potassium: 4.5 mmol/L (ref 3.5–5.3)
Sodium: 140 mmol/L (ref 135–146)
Total Bilirubin: 0.6 mg/dL (ref 0.2–1.2)
Total Protein: 6 g/dL — ABNORMAL LOW (ref 6.1–8.1)

## 2023-10-26 LAB — CBC WITH DIFFERENTIAL/PLATELET
Absolute Lymphocytes: 1222 {cells}/uL (ref 850–3900)
Absolute Monocytes: 595 {cells}/uL (ref 200–950)
Basophils Absolute: 70 {cells}/uL (ref 0–200)
Basophils Relative: 1.1 %
Eosinophils Absolute: 915 {cells}/uL — ABNORMAL HIGH (ref 15–500)
Eosinophils Relative: 14.3 %
HCT: 34.8 % — ABNORMAL LOW (ref 35.0–45.0)
Hemoglobin: 10.7 g/dL — ABNORMAL LOW (ref 11.7–15.5)
MCH: 28.2 pg (ref 27.0–33.0)
MCHC: 30.7 g/dL — ABNORMAL LOW (ref 32.0–36.0)
MCV: 91.6 fL (ref 80.0–100.0)
MPV: 11.6 fL (ref 7.5–12.5)
Monocytes Relative: 9.3 %
Neutro Abs: 3597 {cells}/uL (ref 1500–7800)
Neutrophils Relative %: 56.2 %
Platelets: 241 10*3/uL (ref 140–400)
RBC: 3.8 10*6/uL (ref 3.80–5.10)
RDW: 13.9 % (ref 11.0–15.0)
Total Lymphocyte: 19.1 %
WBC: 6.4 10*3/uL (ref 3.8–10.8)

## 2023-10-26 LAB — TSH: TSH: 0.22 m[IU]/L — ABNORMAL LOW (ref 0.40–4.50)

## 2023-10-26 LAB — LIPID PANEL
Cholesterol: 158 mg/dL (ref <200)
HDL: 58 mg/dL (ref >=50)
LDL Cholesterol (Calc): 79 mg/dL
Non-HDL Cholesterol (Calc): 100 mg/dL (ref <130)
Total CHOL/HDL Ratio: 2.7 (calc) (ref <5.0)
Triglycerides: 111 mg/dL (ref <150)

## 2023-10-27 ENCOUNTER — Ambulatory Visit: Payer: Self-pay | Admitting: Family Medicine

## 2023-11-01 ENCOUNTER — Encounter: Payer: Self-pay | Admitting: Family Medicine

## 2023-11-01 ENCOUNTER — Ambulatory Visit: Payer: Medicare PPO | Admitting: Family Medicine

## 2023-11-01 VITALS — BP 150/80 | HR 80 | Temp 98.1°F | Ht 62.0 in | Wt 166.6 lb

## 2023-11-01 DIAGNOSIS — E039 Hypothyroidism, unspecified: Secondary | ICD-10-CM

## 2023-11-01 DIAGNOSIS — I10 Essential (primary) hypertension: Secondary | ICD-10-CM

## 2023-11-01 MED ORDER — CLONIDINE 0.2 MG/24HR TD PTWK: 0.2000 mg | MEDICATED_PATCH | TRANSDERMAL | 3 refills | Status: AC

## 2023-11-01 MED ORDER — NEBIVOLOL HCL 10 MG PO TABS: 10.0000 mg | ORAL_TABLET | Freq: Every day | ORAL | 3 refills | Status: AC

## 2023-11-01 NOTE — Progress Notes (Signed)
 Subjective:    Patient ID: Jean Baldwin, female    DOB: 1951/05/02, 73 y.o.   MRN: [id number]  HPI   Patient is currently on clonidine  0.2 mg transdermal patch per 24 hours, valsartan , and hydrochlorothiazide .  Her blood pressure consistent 140-150/60-90.  Her TSH was recently low however the exact same dose caused a high TSH in January.  Therefore I believe that this could be a false reading. Past Medical History:  Diagnosis Date   Allergy    Arthritis    knees   Cataract    Dental crowns present    High cholesterol    Hypertension    states under control with meds., has been on med > 20 yr.   Hypothyroidism    Osteopenia    Sleep apnea    TFCC (triangular fibrocartilage complex) tear 03/2017   right wrist/wrist   Past Surgical History:  Procedure Laterality Date   CESAREAN SECTION     JOINT REPLACEMENT     KNEE ARTHROSCOPY W/ MENISCAL REPAIR Left    NASAL SEPTUM SURGERY     TOTAL HIP ARTHROPLASTY Right 07/12/2017   Procedure: RIGHT TOTAL HIP ARTHROPLASTY ANTERIOR APPROACH;  Surgeon: Vernetta Lonni GRADE, MD;  Location: [facility];  Service: Orthopedics;  Laterality: Right;   TUBAL LIGATION     WRIST ARTHROSCOPY WITH DEBRIDEMENT Right 04/12/2017   Procedure: RIGHT WRIST ARTHROSCOPY WITH DEBRIDEMENT;  Surgeon: Murrell Kuba, MD;  Location: [facility];  Service: Orthopedics;  Laterality: Right;   Current Outpatient Medications on File Prior to Visit  Medication Sig Dispense Refill   aspirin  81 MG chewable tablet Chew 1 tablet (81 mg total) by mouth 2 (two) times daily. (Patient taking differently: Chew 81 mg by mouth daily.) 35 tablet 0   atorvastatin  (LIPITOR) 20 MG tablet TAKE 1 TABLET BY MOUTH EVERY DAY 90 tablet 1   Calcium  Carb-Cholecalciferol (CALCIUM /VITAMIN D) 600-400 MG-UNIT TABS Take 2 tablets by mouth daily.     cetirizine (ZYRTEC) 10 MG tablet Take 10 mg by mouth daily.     escitalopram  (LEXAPRO ) 10 MG tablet Take 1 tablet (10 mg total) by mouth  daily. 30 tablet 3   hydrochlorothiazide  (HYDRODIURIL ) 25 MG tablet TAKE 1 TABLET(25 MG) BY MOUTH DAILY 30 tablet 3   levothyroxine  (SYNTHROID ) 100 MCG tablet TAKE 1 TABLET(100 MCG) BY MOUTH DAILY BEFORE BREAKFAST 90 tablet 1   meloxicam (MOBIC) 15 MG tablet TAKE 1 TABLET BY MOUTH EVERY DAY AS NEEDED FOR JOINT PAIN 90 tablet 2   Omega-3 Fatty Acids (FISH OIL) 500 MG CAPS Take by mouth.     valsartan  (DIOVAN ) 320 MG tablet Take 1 tablet (320 mg total) by mouth daily. 90 tablet 1   No current facility-administered medications on file prior to visit.   Allergies  Allergen Reactions   Sulfamethoxazole Other (See Comments)   Codeine Nausea And Vomiting   Sulfa Antibiotics Rash    Social History   Socioeconomic History   Marital status: Married    Spouse name: Not on file   Number of children: 2   Years of education: Not on file   Highest education level: Never attended school  Occupational History   Not on file  Tobacco Use   Smoking status: Never   Smokeless tobacco: Never  Vaping Use   Vaping status: Never Used  Substance and Sexual Activity   Alcohol use: No   Drug use: No   Sexual activity: Yes  Other Topics Concern   Not  on file  Social History Narrative   Not on file   Social Drivers of Health   Financial Resource Strain: Low Risk  (10/28/2023)   Overall Financial Resource Strain (CARDIA)    Difficulty of Paying Living Expenses: Not hard at all  Food Insecurity: No Food Insecurity (10/28/2023)   Hunger Vital Sign    Worried About Running Out of Food in the Last Year: Never true    Ran Out of Food in the Last Year: Never true  Transportation Needs: No Transportation Needs (10/28/2023)   PRAPARE - Administrator, Civil Service (Medical): No    Lack of Transportation (Non-Medical): No  Physical Activity: Insufficiently Active (10/28/2023)   Exercise Vital Sign    Days of Exercise per Week: 2 days    Minutes of Exercise per Session: 20 min  Stress: No  Stress Concern Present (10/28/2023)   [employer] of Occupational Health - Occupational Stress Questionnaire    Feeling of Stress: Not at all  Social Connections: Socially Integrated (10/28/2023)   Social Connection and Isolation Panel    Frequency of Communication with Friends and Family: More than three times a week    Frequency of Social Gatherings with Friends and Family: More than three times a week    Attends Religious Services: More than 4 times per year    Active Member of [employer] or Organizations: Yes    Attends Engineer, structural: More than 4 times per year    Marital Status: Married  Catering manager Violence: Not on file     Review of Systems  All other systems reviewed and are negative.      Objective:   Physical Exam Constitutional:      Appearance: Normal appearance. She is normal weight.   Cardiovascular:     Rate and Rhythm: Normal rate and regular rhythm.     Pulses: Normal pulses.     Heart sounds: Normal heart sounds. No murmur heard.    No gallop.  Pulmonary:     Effort: Pulmonary effort is normal. No respiratory distress.     Breath sounds: Normal breath sounds. No stridor. No wheezing, rhonchi or rales.  Abdominal:     General: Bowel sounds are normal.     Palpations: Abdomen is soft.   Musculoskeletal:     Right lower leg: No edema.     Left lower leg: No edema.   Neurological:     General: No focal deficit present.     Mental Status: She is alert and oriented to person, place, and time.           Assessment & Plan:  Essential hypertension - Plan: cloNIDine  (CATAPRES  - DOSED IN MG/24 HR) 0.2 mg/24hr patch Continue her current medications but add Bystolic  10 mg daily to lower blood pressure.  Recheck in 1 month.  Return in 3 months for a TSH.  If persistently low at that point I will reduce the dose of levothyroxine .  Monitor renal function in 3 months

## 2023-11-09 DIAGNOSIS — G4733 Obstructive sleep apnea (adult) (pediatric): Secondary | ICD-10-CM | POA: Diagnosis not present

## 2023-11-10 ENCOUNTER — Ambulatory Visit (HOSPITAL_BASED_OUTPATIENT_CLINIC_OR_DEPARTMENT_OTHER): Admitting: Physical Therapy

## 2023-11-21 DIAGNOSIS — D649 Anemia, unspecified: Secondary | ICD-10-CM | POA: Diagnosis not present

## 2023-11-21 DIAGNOSIS — Z1211 Encounter for screening for malignant neoplasm of colon: Secondary | ICD-10-CM | POA: Diagnosis not present

## 2023-11-21 DIAGNOSIS — A048 Other specified bacterial intestinal infections: Secondary | ICD-10-CM | POA: Diagnosis not present

## 2023-11-22 DIAGNOSIS — A048 Other specified bacterial intestinal infections: Secondary | ICD-10-CM | POA: Diagnosis not present

## 2023-11-22 DIAGNOSIS — K2900 Acute gastritis without bleeding: Secondary | ICD-10-CM | POA: Diagnosis not present

## 2023-11-23 ENCOUNTER — Other Ambulatory Visit (INDEPENDENT_AMBULATORY_CARE_PROVIDER_SITE_OTHER): Payer: Self-pay

## 2023-11-23 ENCOUNTER — Telehealth: Payer: Self-pay | Admitting: Radiology

## 2023-11-23 ENCOUNTER — Ambulatory Visit: Admitting: Orthopedic Surgery

## 2023-11-23 ENCOUNTER — Encounter: Payer: Self-pay | Admitting: Orthopedic Surgery

## 2023-11-23 DIAGNOSIS — G8929 Other chronic pain: Secondary | ICD-10-CM

## 2023-11-23 DIAGNOSIS — M25562 Pain in left knee: Secondary | ICD-10-CM

## 2023-11-23 NOTE — Progress Notes (Signed)
 Office Visit Note   Patient: Jean Baldwin           Date of Birth: Jun 22, 1950           MRN: [id number] Visit Date: 11/23/2023 Requested by: Duanne Butler DASEN, MD [address] [place],  [place] [address] PCP: Duanne Butler DASEN, MD  Subjective: Chief Complaint  Patient presents with   Left Knee - Pain    HPI: Jean Baldwin is a 73 y.o. female who presents to the office reporting chronic left knee pain.  Has been going on for years.  Does describe pain going up and down steps.  Hard for her to get down to work in the garden.  She has some foot issues but does not really want any surgery for that.  Did have an injection in her knee in January which helped.  Also had 3 shot gel injection in August of last year which helped.  She uses a brace on that left-hand side.  Husband has early Alzheimer's and so she wants to be optimized physically for that responsibility..                ROS: All systems reviewed are negative as they relate to the chief complaint within the history of present illness.  Patient denies fevers or chills.  Assessment & Plan: Visit Diagnoses:  1. Chronic pain of left knee     Plan: Impression is left knee pain with fairly significant medial compartment arthritis on plain radiographs.  No effusion.  Gel injection has helped her before.  I think we should try that again.  Has slight flexion contracture and diminished range of motion of the left knee but right now she is functional.  Follow-Up Instructions: No follow-ups on file.  This patient is diagnosed with osteoarthritis of the knee(s).    Radiographs show evidence of joint space narrowing, osteophytes, subchondral sclerosis and/or subchondral cysts.  This patient has knee pain which interferes with functional and activities of daily living.    This patient has experienced inadequate response, adverse effects and/or intolerance with conservative treatments such as acetaminophen , NSAIDS, topical  creams, physical therapy or regular exercise, knee bracing and/or weight loss.   This patient has experienced inadequate response or has a contraindication to intra articular steroid injections for at least 3 months.   This patient is not scheduled to have a total knee replacement within 6 months of starting treatment with viscosupplementation.  Orders:  Orders Placed This Encounter  Procedures   XR KNEE 3 VIEW LEFT   No orders of the defined types were placed in this encounter.     Procedures: No procedures performed   Clinical Data: No additional findings.  Objective: Vital Signs: There were no vitals taken for this visit.  Physical Exam:  Constitutional: Patient appears well-developed HEENT:  Head: Normocephalic Eyes:EOM are normal Neck: Normal range of motion Cardiovascular: Normal rate Pulmonary/chest: Effort normal Neurologic: Patient is alert Skin: Skin is warm Psychiatric: Patient has normal mood and affect  Ortho Exam: Ortho exam demonstrates range of motion on the right of 5-1 10 range of motion on the left 5-95.  Collateral cruciate ligaments are stable.  No effusion in either knee.  Pedal pulses palpable.  No groin pain with internal or external rotation of either leg.  Specialty Comments:  No specialty comments available.  Imaging: XR KNEE 3 VIEW LEFT Result Date: 11/23/2023 AP lateral merchant radiographs left knee reviewed.  Severe end-stage tricompartmental  arthritis is present worse in the medial compartment.  No acute fracture.  Alignment slight varus    PMFS History: Patient Active Problem List   Diagnosis Date Noted   Tick bite of abdomen 09/22/2022   Obesity 06/10/2021   Daytime somnolence 06/10/2021   Moderate major depression, single episode (HCC) 06/10/2021   Kidney stones 06/10/2021   Obstructive sleep apnea syndrome 06/10/2021   Osteopenia 06/10/2021   Primary osteoarthritis 06/10/2021   Primary hypertension 06/10/2021   Pure  hypercholesterolemia 06/10/2021   Hypothyroidism 03/02/2021   Primary osteoarthritis of left knee 09/19/2019   History of right hip replacement 08/31/2018   Hearing loss 07/11/2018   Rhinitis, chronic 07/11/2018   ETD (Eustachian tube dysfunction), bilateral 04/11/2018   Pulsatile tinnitus of left ear 04/11/2018   Conductive hearing loss of left ear with unrestricted hearing of right ear 02/22/2018   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   Hypothyroidism    Osteopenia    Sleep apnea    TFCC (triangular fibrocartilage complex) tear 03/2017   right wrist/wrist    Family History  Problem Relation Age of Onset   Emphysema Father    Heart disease Father    Other Father        Pacemaker   Heart disease Maternal Grandfather        fatal MI   Breast cancer Maternal Aunt     Past Surgical History:  Procedure Laterality Date   CESAREAN SECTION     JOINT REPLACEMENT     KNEE ARTHROSCOPY W/ MENISCAL REPAIR Left    NASAL SEPTUM SURGERY     TOTAL HIP ARTHROPLASTY Right 07/12/2017   Procedure: RIGHT TOTAL HIP ARTHROPLASTY ANTERIOR APPROACH;  Surgeon: Vernetta Lonni GRADE, MD;  Location: [facility];  Service: Orthopedics;  Laterality: Right;   TUBAL LIGATION     WRIST ARTHROSCOPY WITH DEBRIDEMENT Right 04/12/2017   Procedure: RIGHT WRIST ARTHROSCOPY WITH DEBRIDEMENT;  Surgeon: Murrell Kuba, MD;   Location: [facility];  Service: Orthopedics;  Laterality: Right;   Social History   Occupational History   Not on file  Tobacco Use   Smoking status: Never   Smokeless tobacco: Never  Vaping Use   Vaping status: Never Used  Substance and Sexual Activity   Alcohol use: No   Drug use: No   Sexual activity: Yes

## 2023-11-23 NOTE — Telephone Encounter (Signed)
Please obtain authorization for left knee gel injection.-Dean Thanks.

## 2023-11-30 NOTE — Telephone Encounter (Signed)
 VOB submitted for Orthovisc, left knee

## 2023-12-01 DIAGNOSIS — E039 Hypothyroidism, unspecified: Secondary | ICD-10-CM | POA: Diagnosis not present

## 2023-12-01 DIAGNOSIS — E782 Mixed hyperlipidemia: Secondary | ICD-10-CM | POA: Diagnosis not present

## 2023-12-01 DIAGNOSIS — Z6829 Body mass index (BMI) 29.0-29.9, adult: Secondary | ICD-10-CM | POA: Diagnosis not present

## 2023-12-01 DIAGNOSIS — M159 Polyosteoarthritis, unspecified: Secondary | ICD-10-CM | POA: Diagnosis not present

## 2023-12-01 DIAGNOSIS — F322 Major depressive disorder, single episode, severe without psychotic features: Secondary | ICD-10-CM | POA: Diagnosis not present

## 2023-12-01 DIAGNOSIS — G4733 Obstructive sleep apnea (adult) (pediatric): Secondary | ICD-10-CM | POA: Diagnosis not present

## 2023-12-01 DIAGNOSIS — E663 Overweight: Secondary | ICD-10-CM | POA: Diagnosis not present

## 2023-12-01 DIAGNOSIS — I10 Essential (primary) hypertension: Secondary | ICD-10-CM | POA: Diagnosis not present

## 2023-12-08 ENCOUNTER — Encounter (HOSPITAL_BASED_OUTPATIENT_CLINIC_OR_DEPARTMENT_OTHER): Payer: Self-pay | Admitting: Family

## 2023-12-08 ENCOUNTER — Ambulatory Visit (HOSPITAL_BASED_OUTPATIENT_CLINIC_OR_DEPARTMENT_OTHER): Admitting: Family

## 2023-12-08 VITALS — BP 120/71 | HR 55 | Ht 63.0 in | Wt 171.6 lb

## 2023-12-08 DIAGNOSIS — I25118 Atherosclerotic heart disease of native coronary artery with other forms of angina pectoris: Secondary | ICD-10-CM

## 2023-12-08 DIAGNOSIS — I10 Essential (primary) hypertension: Secondary | ICD-10-CM | POA: Diagnosis not present

## 2023-12-08 DIAGNOSIS — E785 Hyperlipidemia, unspecified: Secondary | ICD-10-CM

## 2023-12-08 MED ORDER — VALSARTAN 320 MG PO TABS: 320.0000 mg | ORAL_TABLET | Freq: Every day | ORAL | 3 refills | Status: AC

## 2023-12-08 NOTE — Progress Notes (Signed)
 Advanced Hypertension Clinic Assessment:    Date:  12/08/2023   ID:  Jean Baldwin, DOB 1950-05-31, MRN [id number]  PCP:  Duanne Butler DASEN, MD  Cardiologist:  None  Nephrologist:  Referring MD: Duanne Butler DASEN, MD   CC: Hypertension  History of Present Illness:    Jean Baldwin is a 73 y.o. female with a hx of hypertension, hypothyroidism, OSA, HLD, nonobstructive CAD. Here to follow up in the Advanced Hypertension Clinic. She requested cardiology evaluation due to family history as her husband also follows with our practice. Family history notable for father with PPM, and maternal grandfather with fatal MI.   Jean Baldwin was diagnosed with hypertension in her 20-30s.  Had to take medications during pregnancy but reports she did not have preeclampsia. No prior tobacco use. At her initial Advanced Hypertension Clinic visit Losartan  transitioned to Valsartan . Cardiac CTA due to family history of CAD performed 08/18/23 calcium  score 475 placing her in 88th percentile with moderate mixed plaque in first diagonal (50-69%), mild pLAD (25-49%), mild pLCx (25-49%), mid RCA <25%.   Prior secondary workup: 24h metanephrines 06/15/23 were normal.  05/2023 renal arteries no stenosis 06/13/23 labs not consistent with hyperaldosteronism  At visit 09/28/2023 her BP was at goal in clinic but labile at home.  Valsartan  increased to 320 mg daily.  Clonidine  patch reduced from 0.3 to 0.2 mg for better tolerability.  She had significant arthritis pain and was given permission to resume meloxicam 15 mg nightly and utilized as needed Tylenol  arthritis.  She was referred to water PT.  Subsequent nurse visit 1 week later with home BP cuff found to be an accurate and BP manually 135/84.  Presents today for follow up. Presently undergoing treatment for H.pylori with significant pill burden which is understandably frustrated. Not noticing significant symptoms related to h.pylori thankfully. Her arthritis  pain is somehwat improved with Meloxicam every other day. Planning to get gel shot in her left knee next week. She got a new Omron upper arm cuff and is monitoring BP every evening after resting at home with readings: minorly elevated. If she retakes it her BP improved by dropping 10- points then will drop again. Average BP after recheck 131/63 over last 10 readings. BP cuff today found to be reasonably accurate. BP recently at PCP 116/65. Dry mouth improved compared to previous.   Our BP check: 120/71 Home cuff: 128/69  Previous antihypertensives: Nebivolol  - ineffective Spironolactone  Amlodipine  - LE edema Clonidine  tablet - changed to patch   Past Medical History:  Diagnosis Date   Allergy    Arthritis    knees   Cataract    Dental crowns present    High cholesterol    Hypertension    states under control with meds., has been on med > 20 yr.   Hypothyroidism    Osteopenia    Sleep apnea    TFCC (triangular fibrocartilage complex) tear 03/2017   right wrist/wrist    Past Surgical History:  Procedure Laterality Date   CESAREAN SECTION     JOINT REPLACEMENT     KNEE ARTHROSCOPY W/ MENISCAL REPAIR Left    NASAL SEPTUM SURGERY     TOTAL HIP ARTHROPLASTY Right 07/12/2017   Procedure: RIGHT TOTAL HIP ARTHROPLASTY ANTERIOR APPROACH;  Surgeon: Vernetta Lonni GRADE, MD;  Location: [facility];  Service: Orthopedics;  Laterality: Right;   TUBAL LIGATION     WRIST ARTHROSCOPY WITH DEBRIDEMENT Right 04/12/2017   Procedure: RIGHT WRIST ARTHROSCOPY WITH  DEBRIDEMENT;  Surgeon: Murrell Kuba, MD;  Location: [facility];  Service: Orthopedics;  Laterality: Right;    Current Medications: Current Meds  Medication Sig   aspirin  81 MG chewable tablet Chew 1 tablet (81 mg total) by mouth 2 (two) times daily. (Patient taking differently: Chew 81 mg by mouth daily.)   atorvastatin  (LIPITOR) 20 MG tablet TAKE 1 TABLET BY MOUTH EVERY DAY   Calcium  Carb-Cholecalciferol (CALCIUM /VITAMIN  D) 600-400 MG-UNIT TABS Take 2 tablets by mouth daily.   cetirizine (ZYRTEC) 10 MG tablet Take 10 mg by mouth daily.   cloNIDine  (CATAPRES  - DOSED IN MG/24 HR) 0.2 mg/24hr patch Place 1 patch (0.2 mg total) onto the skin once a week.   escitalopram  (LEXAPRO ) 10 MG tablet Take 1 tablet (10 mg total) by mouth daily.   hydrochlorothiazide  (HYDRODIURIL ) 25 MG tablet TAKE 1 TABLET(25 MG) BY MOUTH DAILY   levothyroxine  (SYNTHROID ) 100 MCG tablet TAKE 1 TABLET(100 MCG) BY MOUTH DAILY BEFORE BREAKFAST   meloxicam (MOBIC) 15 MG tablet TAKE 1 TABLET BY MOUTH EVERY DAY AS NEEDED FOR JOINT PAIN   nebivolol  (BYSTOLIC ) 10 MG tablet Take 1 tablet (10 mg total) by mouth daily.   Omega-3 Fatty Acids (FISH OIL) 500 MG CAPS Take by mouth.   TALICIA 250-12.5-10 MG CPDR 4 capsules Orally every 8 hrs; Duration: 14 day(s)   valsartan  (DIOVAN ) 320 MG tablet Take 1 tablet (320 mg total) by mouth daily.     Allergies:   Sulfamethoxazole, Codeine, and Sulfa antibiotics   Social History   Socioeconomic History   Marital status: Married    Spouse name: Not on file   Number of children: 2   Years of education: Not on file   Highest education level: Never attended school  Occupational History   Not on file  Tobacco Use   Smoking status: Never   Smokeless tobacco: Never  Vaping Use   Vaping status: Never Used  Substance and Sexual Activity   Alcohol use: No   Drug use: No   Sexual activity: Yes  Other Topics Concern   Not on file  Social History Narrative   Not on file   Social Drivers of Health   Financial Resource Strain: Low Risk  (10/28/2023)   Overall Financial Resource Strain (CARDIA)    Difficulty of Paying Living Expenses: Not hard at all  Food Insecurity: No Food Insecurity (10/28/2023)   Hunger Vital Sign    Worried About Running Out of Food in the Last Year: Never true    Ran Out of Food in the Last Year: Never true  Transportation Needs: No Transportation Needs (10/28/2023)   PRAPARE -  Administrator, Civil Service (Medical): No    Lack of Transportation (Non-Medical): No  Physical Activity: Insufficiently Active (10/28/2023)   Exercise Vital Sign    Days of Exercise per Week: 2 days    Minutes of Exercise per Session: 20 min  Stress: No Stress Concern Present (10/28/2023)   [employer] of Occupational Health - Occupational Stress Questionnaire    Feeling of Stress: Not at all  Social Connections: Socially Integrated (10/28/2023)   Social Connection and Isolation Panel    Frequency of Communication with Friends and Family: More than three times a week    Frequency of Social Gatherings with Friends and Family: More than three times a week    Attends Religious Services: More than 4 times per year    Active Member of [employer] or Organizations: Yes  Attends Engineer, structural: More than 4 times per year    Marital Status: Married     Family History: The patient's family history includes Breast cancer in her maternal aunt; Emphysema in her father; Heart disease in her father and maternal grandfather; Other in her father.  ROS:   Please see the history of present illness.     All other systems reviewed and are negative.  EKGs/Labs/Other Studies Reviewed:         Recent Labs: 10/26/2023: ALT 12; BUN 29; Creat 1.00; Hemoglobin 10.7; Platelets 241; Potassium 4.5; Sodium 140; TSH 0.22   Recent Lipid Panel    Component Value Date/Time   CHOL 158 10/26/2023 1109   TRIG 111 10/26/2023 1109   HDL 58 10/26/2023 1109   CHOLHDL 2.7 10/26/2023 1109   LDLCALC 79 10/26/2023 1109    Physical Exam:   VS:  BP 120/71 (BP Location: Left Arm, Patient Position: Sitting, Cuff Size: Normal)   Pulse (!) 55   Ht 5' 3 (1.6 m)   Wt 171 lb 9.6 oz (77.8 kg)   SpO2 98%   BMI 30.40 kg/m  , BMI Body mass index is 30.4 kg/m. GENERAL:  Well appearing HEENT: Pupils equal round and reactive, fundi not visualized, oral mucosa unremarkable NECK:  No jugular  venous distention, waveform within normal limits, carotid upstroke brisk and symmetric, no bruits, no thyromegaly LYMPHATICS:  No cervical adenopathy LUNGS:  Clear to auscultation bilaterally HEART:  RRR.  PMI not displaced or sustained,S1 and S2 within normal limits, no S3, no S4, no clicks, no rubs, no murmurs ABD:  Flat, positive bowel sounds normal in frequency in pitch, no bruits, no rebound, no guarding, no midline pulsatile mass, no hepatomegaly, no splenomegaly EXT:  2 plus pulses throughout, no edema, no cyanosis no clubbing SKIN:  No rashes no nodules NEURO:  Cranial nerves II through XII grossly intact, motor grossly intact throughout PSYCH:  Cognitively intact, oriented to person place and time   ASSESSMENT/PLAN:    HTN - BP at goal in clinic. Prior secondary workup with PCP was very thorough and unremarkable.  Home BP cuff found to be reasonably accurate (Our manual BP check: 120/71  Home cuff: 128/69) Continue current medication Valsartan  to 320mg  daily, clonidine  0.2 mg patch weekly, HCTZ 25 mg daily, Nebivolol  10 mg daily. Discussed to monitor BP at home at least 2 hours after medications and sitting for 5-10 minutes.  OSA - CPAP compliance encouraged.   Nonobstructive CAD / HLD, LDL goal <70 / Aortic atherosclerosis / Family history of cardiovascular disease- Stable with no anginal symptoms. No indication for ischemic evaluation.   Continue Aspirin  81mg  daily, Atorvastatin  20mg  daily. 06/2023 LDL 62.   Screening for Secondary Hypertension:     Relevant Labs/Studies:    Latest Ref Rng & Units 10/26/2023   11:09 AM 10/07/2023    1:17 PM 08/15/2023    8:45 AM  Basic Labs  Sodium 135 - 146 mmol/L 140  143  143   Potassium 3.5 - 5.3 mmol/L 4.5  5.0  4.2   Creatinine 0.60 - 1.00 mg/dL 8.99  9.05  8.89        Latest Ref Rng & Units 10/26/2023   11:09 AM 07/01/2023    8:17 AM  Thyroid    TSH 0.40 - 4.50 mIU/L 0.22  5.54        Latest Ref Rng & Units 06/13/2023    4:19 PM   Renin/Aldosterone   Aldosterone ___  ng/dL 3              Disposition:    FU with MD/APP/PharmD in 6 mos   Medication Adjustments/Labs and Tests Ordered: Current medicines are reviewed at length with the patient today.  Concerns regarding medicines are outlined above.  No orders of the defined types were placed in this encounter.  No orders of the defined types were placed in this encounter.    Signed, Reche GORMAN Finder, NP  12/08/2023 10:08 AM    [facility]

## 2023-12-08 NOTE — Patient Instructions (Addendum)
 Medication Instructions:  Continue your current medications.     Follow-Up: Please follow up in 6 months in ADV HTN CLINIC with Dr. Raford, Reche Finder, NP or Allean Mink PharmD    Special Instructions:    Check blood pressure 2-3 times per week and keep a log.   Be sure to sit and rest 5-10 minutes prior to checking blood pressure.   Our BP check: 120/71 Home cuff: 128/69

## 2023-12-14 ENCOUNTER — Encounter: Payer: Self-pay | Admitting: Orthopedic Surgery

## 2023-12-14 ENCOUNTER — Ambulatory Visit: Admitting: Orthopedic Surgery

## 2023-12-14 ENCOUNTER — Other Ambulatory Visit: Payer: Self-pay

## 2023-12-14 DIAGNOSIS — G8929 Other chronic pain: Secondary | ICD-10-CM

## 2023-12-14 DIAGNOSIS — M1712 Unilateral primary osteoarthritis, left knee: Secondary | ICD-10-CM | POA: Diagnosis not present

## 2023-12-14 DIAGNOSIS — M170 Bilateral primary osteoarthritis of knee: Secondary | ICD-10-CM

## 2023-12-14 NOTE — Progress Notes (Signed)
   Procedure Note  Patient: Shavon Ashmore             Date of Birth: 01/01/1951           MRN: [id number]             Visit Date: 12/14/2023  Procedures: Visit Diagnoses:  1. Primary osteoarthritis of both knees     Large Joint Inj: L knee on 12/17/2023 6:55 AM Indications: diagnostic evaluation, joint swelling and pain Details: 18 G 1.5 in needle, superolateral approach  Arthrogram: No  Medications: 5 mL lidocaine  1 %; 30 mg Hyaluronan 30 MG/2ML Outcome: tolerated well, no immediate complications Procedure, treatment alternatives, risks and benefits explained, specific risks discussed. Consent was given by the patient. Immediately prior to procedure a time out was called to verify the correct patient, procedure, equipment, support staff and site/side marked as required. Patient was prepped and draped in the usual sterile fashion.     Lot [id number]

## 2023-12-17 DIAGNOSIS — M1712 Unilateral primary osteoarthritis, left knee: Secondary | ICD-10-CM | POA: Diagnosis not present

## 2023-12-17 MED ORDER — HYALURONAN 30 MG/2ML IX SOSY
30.0000 mg | PREFILLED_SYRINGE | INTRA_ARTICULAR | Status: AC | PRN
  Administered 2023-12-17: 30 mg via INTRA_ARTICULAR

## 2023-12-17 MED ORDER — LIDOCAINE HCL 1 % IJ SOLN
5.0000 mL | INTRAMUSCULAR | Status: AC | PRN
  Administered 2023-12-17: 5 mL

## 2023-12-23 ENCOUNTER — Ambulatory Visit: Admitting: Surgical

## 2023-12-23 DIAGNOSIS — M1712 Unilateral primary osteoarthritis, left knee: Secondary | ICD-10-CM

## 2023-12-23 DIAGNOSIS — M170 Bilateral primary osteoarthritis of knee: Secondary | ICD-10-CM

## 2023-12-25 ENCOUNTER — Encounter: Payer: Self-pay | Admitting: Surgical

## 2023-12-25 MED ORDER — LIDOCAINE HCL 1 % IJ SOLN
5.0000 mL | INTRAMUSCULAR | Status: AC | PRN
  Administered 2023-12-23: 5 mL

## 2023-12-25 MED ORDER — HYALURONAN 30 MG/2ML IX SOSY
30.0000 mg | PREFILLED_SYRINGE | INTRA_ARTICULAR | Status: AC | PRN
  Administered 2023-12-23: 30 mg via INTRA_ARTICULAR

## 2023-12-25 NOTE — Progress Notes (Signed)
   Procedure Note  Patient: Jean Baldwin             Date of Birth: 1951-02-02           MRN: [id number]             Visit Date: 12/23/2023  Procedures: Visit Diagnoses:  1. Primary osteoarthritis of both knees     Large Joint Inj: L knee on 12/23/2023 5:13 PM Indications: diagnostic evaluation, joint swelling and pain Details: 18 G 1.5 in needle, superolateral approach  Arthrogram: No  Medications: 5 mL lidocaine  1 %; 30 mg Hyaluronan 30 MG/2ML Outcome: tolerated well, no immediate complications Procedure, treatment alternatives, risks and benefits explained, specific risks discussed. Consent was given by the patient. Immediately prior to procedure a time out was called to verify the correct patient, procedure, equipment, support staff and site/side marked as required. Patient was prepped and draped in the usual sterile fashion.

## 2023-12-30 ENCOUNTER — Ambulatory Visit: Admitting: Surgical

## 2023-12-30 ENCOUNTER — Encounter: Payer: Self-pay | Admitting: Surgical

## 2023-12-30 DIAGNOSIS — M1712 Unilateral primary osteoarthritis, left knee: Secondary | ICD-10-CM

## 2023-12-30 DIAGNOSIS — M170 Bilateral primary osteoarthritis of knee: Secondary | ICD-10-CM

## 2023-12-30 MED ORDER — HYALURONAN 88 MG/4ML IX SOSY
88.0000 mg | PREFILLED_SYRINGE | INTRA_ARTICULAR | Status: AC | PRN
  Administered 2023-12-30: 88 mg via INTRA_ARTICULAR

## 2023-12-30 MED ORDER — LIDOCAINE HCL 1 % IJ SOLN
5.0000 mL | INTRAMUSCULAR | Status: AC | PRN
  Administered 2023-12-30: 5 mL

## 2023-12-30 NOTE — Progress Notes (Signed)
   Procedure Note  Patient: Jean Baldwin             Date of Birth: 09/26/1950           MRN: [id number]             Visit Date: 12/30/2023  Procedures: Visit Diagnoses:  1. Primary osteoarthritis of both knees     Large Joint Inj: L knee on 12/30/2023 10:27 AM Indications: pain, joint swelling and diagnostic evaluation Details: 18 G 1.5 in needle, superolateral approach  Arthrogram: No  Medications: 5 mL lidocaine  1 %; 88 mg Hyaluronan 88 MG/4ML Outcome: tolerated well, no immediate complications Procedure, treatment alternatives, risks and benefits explained, specific risks discussed. Consent was given by the patient. Immediately prior to procedure a time out was called to verify the correct patient, procedure, equipment, support staff and site/side marked as required. Patient was prepped and draped in the usual sterile fashion.

## 2024-01-20 ENCOUNTER — Encounter: Payer: Self-pay | Admitting: Urology

## 2024-01-20 ENCOUNTER — Ambulatory Visit (HOSPITAL_COMMUNITY)
Admission: RE | Admit: 2024-01-20 | Discharge: 2024-01-20 | Disposition: A | Discharge status: Home / Self Care | Source: Ambulatory Visit | Attending: Urology | Admitting: Urology

## 2024-01-20 ENCOUNTER — Ambulatory Visit: Admitting: Urology

## 2024-01-20 VITALS — BP 148/79 | HR 58

## 2024-01-20 DIAGNOSIS — R3915 Urgency of urination: Secondary | ICD-10-CM

## 2024-01-20 DIAGNOSIS — N200 Calculus of kidney: Secondary | ICD-10-CM | POA: Diagnosis not present

## 2024-01-20 DIAGNOSIS — Z96641 Presence of right artificial hip joint: Secondary | ICD-10-CM | POA: Diagnosis not present

## 2024-01-20 LAB — URINALYSIS, ROUTINE W REFLEX MICROSCOPIC
Bilirubin, UA: NEGATIVE
Glucose, UA: NEGATIVE
Ketones, UA: NEGATIVE
Nitrite, UA: NEGATIVE
Protein,UA: NEGATIVE
RBC, UA: NEGATIVE
Specific Gravity, UA: 1.02 (ref 1.005–1.030)
Urobilinogen, Ur: 0.2 mg/dL (ref 0.2–1.0)
pH, UA: 6 (ref 5.0–7.5)

## 2024-01-20 LAB — MICROSCOPIC EXAMINATION: Bacteria, UA: NONE SEEN

## 2024-01-20 NOTE — Progress Notes (Signed)
 01/20/2024 11:23 AM   Jean Baldwin 06/16/1950 [id number]  Referring provider: Duanne Butler DASEN, MD [address] [place],  [place] [address]  Followup nephrolithiasis   HPI: Jean Baldwin is a 73yo here for followup for nephrolithiasis. She passed a 3mm since last visit and KUB today shows no ureteral calculi. No flank pain. She has worsening urinary urgency and urge incontinence. Incontinence happens 1-3 times per week. Urine stream strong. No straining to urinate   PMH: Past Medical History:  Diagnosis Date   Allergy    Arthritis    knees   Cataract    Dental crowns present    High cholesterol    Hypertension    states under control with meds., has been on med > 20 yr.   Hypothyroidism    Osteopenia    Sleep apnea    TFCC (triangular fibrocartilage complex) tear 03/2017   right wrist/wrist    Surgical History: Past Surgical History:  Procedure Laterality Date   CESAREAN SECTION     JOINT REPLACEMENT     KNEE ARTHROSCOPY W/ MENISCAL REPAIR Left    NASAL SEPTUM SURGERY     TOTAL HIP ARTHROPLASTY Right 07/12/2017   Procedure: RIGHT TOTAL HIP ARTHROPLASTY ANTERIOR APPROACH;  Surgeon: Vernetta Lonni GRADE, MD;  Location: [facility];  Service: Orthopedics;  Laterality: Right;   TUBAL LIGATION     WRIST ARTHROSCOPY WITH DEBRIDEMENT Right 04/12/2017   Procedure: RIGHT WRIST ARTHROSCOPY WITH DEBRIDEMENT;  Surgeon: Murrell Kuba, MD;  Location: [facility];  Service: Orthopedics;  Laterality: Right;    Home Medications:  Allergies as of 01/20/2024       Reactions   Sulfamethoxazole Other (See Comments)   Codeine Nausea And Vomiting   Sulfa Antibiotics Rash        Medication List        Accurate as of January 20, 2024 11:23 AM. If you have any questions, ask your nurse or doctor.          aspirin  81 MG chewable tablet Chew 1 tablet (81 mg total) by mouth 2 (two) times daily. What changed: when to take this   atorvastatin  20 MG  tablet Commonly known as: LIPITOR TAKE 1 TABLET BY MOUTH EVERY DAY   Calcium /Vitamin D 600-400 MG-UNIT Tabs Take 2 tablets by mouth daily.   cetirizine 10 MG tablet Commonly known as: ZYRTEC Take 10 mg by mouth daily.   cloNIDine  0.2 mg/24hr patch Commonly known as: CATAPRES  - Dosed in mg/24 hr Place 1 patch (0.2 mg total) onto the skin once a week.   escitalopram  10 MG tablet Commonly known as: Lexapro  Take 1 tablet (10 mg total) by mouth daily.   Fish Oil 500 MG Caps Take by mouth.   hydrochlorothiazide  25 MG tablet Commonly known as: HYDRODIURIL  TAKE 1 TABLET(25 MG) BY MOUTH DAILY   levothyroxine  100 MCG tablet Commonly known as: SYNTHROID  TAKE 1 TABLET(100 MCG) BY MOUTH DAILY BEFORE BREAKFAST   meloxicam 15 MG tablet Commonly known as: MOBIC TAKE 1 TABLET BY MOUTH EVERY DAY AS NEEDED FOR JOINT PAIN   nebivolol  10 MG tablet Commonly known as: BYSTOLIC  Take 1 tablet (10 mg total) by mouth daily.   Talicia 250-12.5-10 MG Cpdr Generic drug: Amoxicill-Rifabutin-Omeprazole 4 capsules Orally every 8 hrs; Duration: 14 day(s)   valsartan  320 MG tablet Commonly known as: Diovan  Take 1 tablet (320 mg total) by mouth daily.        Allergies:  Allergies  Allergen  Reactions   Sulfamethoxazole Other (See Comments)   Codeine Nausea And Vomiting   Sulfa Antibiotics Rash    Family History: Family History  Problem Relation Age of Onset   Emphysema Father    Heart disease Father    Other Father        Pacemaker   Heart disease Maternal Grandfather        fatal MI   Breast cancer Maternal Aunt     Social History:  reports that she has never smoked. She has never used smokeless tobacco. She reports that she does not drink alcohol and does not use drugs.  ROS: All other review of systems were reviewed and are negative except what is noted above in HPI  Physical Exam: BP (!) 148/79   Pulse (!) 58   Constitutional:  Alert and oriented, No acute  distress. HEENT: [place] AT, moist mucus membranes.  Trachea midline, no masses. Cardiovascular: No clubbing, cyanosis, or edema. Respiratory: Normal respiratory effort, no increased work of breathing. GI: Abdomen is soft, nontender, nondistended, no abdominal masses GU: No CVA tenderness.  Lymph: No cervical or inguinal lymphadenopathy. Skin: No rashes, bruises or suspicious lesions. Neurologic: Grossly intact, no focal deficits, moving all 4 extremities. Psychiatric: Normal mood and affect.  Laboratory Data: Lab Results  Component Value Date   WBC 6.4 10/26/2023   HGB 10.7 (L) 10/26/2023   HCT 34.8 (L) 10/26/2023   MCV 91.6 10/26/2023   PLT 241 10/26/2023    Lab Results  Component Value Date   CREATININE 1.00 10/26/2023    No results found for: PSA  No results found for: TESTOSTERONE  No results found for: HGBA1C  Urinalysis    Component Value Date/Time   COLORURINE YELLOW 05/22/2023 0002   APPEARANCEUR Clear 07/19/2023 1304   LABSPEC 1.024 05/22/2023 0002   PHURINE 7.0 05/22/2023 0002   GLUCOSEU Negative 07/19/2023 1304   HGBUR NEGATIVE 05/22/2023 0002   BILIRUBINUR Negative 07/19/2023 1304   KETONESUR NEGATIVE 05/22/2023 0002   PROTEINUR Negative 07/19/2023 1304   PROTEINUR TRACE (A) 05/22/2023 0002   NITRITE Negative 07/19/2023 1304   NITRITE NEGATIVE 05/22/2023 0002   LEUKOCYTESUR Negative 07/19/2023 1304   LEUKOCYTESUR TRACE (A) 05/22/2023 0002    Lab Results  Component Value Date   LABMICR Comment 07/19/2023   WBCUA 0-5 07/01/2023   LABEPIT 0-10 07/01/2023   BACTERIA None seen 07/01/2023    Pertinent Imaging: KUb today: Images reviewed and discussed with the patient  Results for orders placed during the hospital encounter of 07/19/23  Abdomen 1 view (KUB)  Narrative CLINICAL DATA:  Follow-up right nephrolithiasis  EXAM: ABDOMEN - 1 VIEW  COMPARISON:  07/01/2023  FINDINGS: Scattered large and small bowel gas is noted. Previously seen  lower pole right renal stone is not well appreciated on this exam. Degenerative changes of lumbar spine are noted. Right hip replacement is seen.  IMPRESSION: Previously seen right-sided renal stone is not well appreciated on today's exam.   Electronically Signed By: Oneil Devonshire M.D. On: 08/04/2023 03:57  No results found for this or any previous visit.  No results found for this or any previous visit.  No results found for this or any previous visit.  No results found for this or any previous visit.  No results found for this or any previous visit.  No results found for this or any previous visit.  Results for orders placed during the hospital encounter of 05/21/23  CT Renal Stone Study  Narrative CLINICAL  DATA:  Abdominal pain  EXAM: CT ABDOMEN AND PELVIS WITHOUT CONTRAST  TECHNIQUE: Multidetector CT imaging of the abdomen and pelvis was performed following the standard protocol without IV contrast.  RADIATION DOSE REDUCTION: This exam was performed according to the departmental dose-optimization program which includes automated exposure control, adjustment of the mA and/or kV according to patient size and/or use of iterative reconstruction technique.  COMPARISON:  CT chest dated 04/30/2022  FINDINGS: Lower chest: Mild bibasilar scarring.  Hepatobiliary: 2.9 cm cyst in segment 4A (series 3/image 16).  Gallbladder is unremarkable. No intrahepatic or extrahepatic duct dilatation.  Pancreas: Within normal limits.  Spleen: Within normal limits.  Adrenals/Urinary Tract: Adrenal glands are within normal limits.  Left kidney is within normal limits.  3 mm nonobstructing right upper pole renal calculus (series 3/image 20).  Mild fullness the right renal collecting system with associated 3 mm proximal right ureteral calculus at the old 2 3 level (coronal image 41).  Bladder is partially obscured by streak artifact but  grossly unremarkable.  Stomach/Bowel: Stomach is notable for a tiny hiatal hernia.  No evidence of bowel obstruction.  Normal appendix (series 3/image 84).  No colonic wall thickening or inflammatory changes.  Vascular/Lymphatic: No evidence of abdominal aortic aneurysm.  Atherosclerotic calcifications of the abdominal aorta and branch vessels.  No suspicious abdominopelvic lymphadenopathy.  Reproductive: Status post hysterectomy.  Left ovary is unremarkable.  No right adnexal mass.  Other: No abdominopelvic ascites.  Musculoskeletal: Degenerative changes of the visualized thoracolumbar spine. Right hip arthroplasty.  IMPRESSION: 3 mm proximal right ureteral calculus. Associated mild fullness the right renal collecting system.  Additional 3 mm nonobstructing right upper pole renal calculus.   Electronically Signed By: Pinkie Pebbles M.D. On: 05/22/2023 00:04   Assessment & Plan:    1. Kidney stones (Primary) Dietary handout given -followup 1 year with KUB - Urinalysis, Routine w reflex microscopic  2. Urinary urgency -patient was instructed on timed voiding  No follow-ups on file.  Belvie Clara, MD  [facility] [place]

## 2024-01-20 NOTE — Patient Instructions (Signed)

## 2024-01-27 ENCOUNTER — Other Ambulatory Visit: Payer: Self-pay | Admitting: Family Medicine

## 2024-01-30 ENCOUNTER — Other Ambulatory Visit (HOSPITAL_COMMUNITY): Payer: Self-pay

## 2024-01-31 ENCOUNTER — Other Ambulatory Visit: Payer: Self-pay | Admitting: Family Medicine

## 2024-02-01 ENCOUNTER — Other Ambulatory Visit

## 2024-02-01 ENCOUNTER — Other Ambulatory Visit: Payer: Self-pay | Admitting: Family Medicine

## 2024-02-01 DIAGNOSIS — E039 Hypothyroidism, unspecified: Secondary | ICD-10-CM

## 2024-02-02 ENCOUNTER — Ambulatory Visit: Payer: Self-pay | Admitting: Family Medicine

## 2024-02-02 DIAGNOSIS — E039 Hypothyroidism, unspecified: Secondary | ICD-10-CM | POA: Diagnosis not present

## 2024-02-02 DIAGNOSIS — E782 Mixed hyperlipidemia: Secondary | ICD-10-CM | POA: Diagnosis not present

## 2024-02-02 DIAGNOSIS — Z6831 Body mass index (BMI) 31.0-31.9, adult: Secondary | ICD-10-CM | POA: Diagnosis not present

## 2024-02-02 DIAGNOSIS — I10 Essential (primary) hypertension: Secondary | ICD-10-CM | POA: Diagnosis not present

## 2024-02-02 DIAGNOSIS — M159 Polyosteoarthritis, unspecified: Secondary | ICD-10-CM | POA: Diagnosis not present

## 2024-02-02 DIAGNOSIS — E663 Overweight: Secondary | ICD-10-CM | POA: Diagnosis not present

## 2024-02-02 DIAGNOSIS — G4733 Obstructive sleep apnea (adult) (pediatric): Secondary | ICD-10-CM | POA: Diagnosis not present

## 2024-02-02 DIAGNOSIS — F322 Major depressive disorder, single episode, severe without psychotic features: Secondary | ICD-10-CM | POA: Diagnosis not present

## 2024-02-02 LAB — BASIC METABOLIC PANEL WITHOUT GFR
BUN/Creatinine Ratio: 29 (calc) — ABNORMAL HIGH (ref 6–22)
BUN: 27 mg/dL — ABNORMAL HIGH (ref 7–25)
CO2: 28 mmol/L (ref 20–32)
Calcium: 9.6 mg/dL (ref 8.6–10.4)
Chloride: 108 mmol/L (ref 98–110)
Creat: 0.93 mg/dL (ref 0.60–1.00)
Glucose, Bld: 104 mg/dL — ABNORMAL HIGH (ref 65–99)
Potassium: 4.6 mmol/L (ref 3.5–5.3)
Sodium: 143 mmol/L (ref 135–146)

## 2024-02-02 LAB — TSH: TSH: 0.31 m[IU]/L — ABNORMAL LOW (ref 0.40–4.50)

## 2024-02-03 ENCOUNTER — Other Ambulatory Visit: Payer: Self-pay

## 2024-02-03 DIAGNOSIS — E039 Hypothyroidism, unspecified: Secondary | ICD-10-CM

## 2024-02-03 MED ORDER — LEVOTHYROXINE SODIUM 100 MCG PO TABS: 100.0000 ug | ORAL_TABLET | ORAL | 1 refills | Status: AC

## 2024-02-03 MED ORDER — LEVOTHYROXINE SODIUM 88 MCG PO TABS: 88.0000 ug | ORAL_TABLET | ORAL | 1 refills | Status: AC

## 2024-02-07 DIAGNOSIS — G4733 Obstructive sleep apnea (adult) (pediatric): Secondary | ICD-10-CM | POA: Diagnosis not present

## 2024-02-15 ENCOUNTER — Ambulatory Visit

## 2024-02-16 ENCOUNTER — Other Ambulatory Visit

## 2024-02-16 ENCOUNTER — Ambulatory Visit (INDEPENDENT_AMBULATORY_CARE_PROVIDER_SITE_OTHER)

## 2024-02-16 DIAGNOSIS — D649 Anemia, unspecified: Secondary | ICD-10-CM

## 2024-02-16 DIAGNOSIS — Z8639 Personal history of other endocrine, nutritional and metabolic disease: Secondary | ICD-10-CM | POA: Insufficient documentation

## 2024-02-16 DIAGNOSIS — Z1331 Encounter for screening for depression: Secondary | ICD-10-CM | POA: Insufficient documentation

## 2024-02-16 DIAGNOSIS — Z72820 Sleep deprivation: Secondary | ICD-10-CM | POA: Insufficient documentation

## 2024-02-16 DIAGNOSIS — Z23 Encounter for immunization: Secondary | ICD-10-CM

## 2024-02-16 DIAGNOSIS — R7309 Other abnormal glucose: Secondary | ICD-10-CM | POA: Insufficient documentation

## 2024-02-16 DIAGNOSIS — F329 Major depressive disorder, single episode, unspecified: Secondary | ICD-10-CM | POA: Insufficient documentation

## 2024-02-16 NOTE — Progress Notes (Signed)
 Patient is in office today for a nurse visit for Immunization. Patient Injection was given in the  Left deltoid. Patient tolerated injection well.

## 2024-02-17 ENCOUNTER — Ambulatory Visit: Payer: Self-pay | Admitting: Family Medicine

## 2024-02-17 LAB — CBC WITH DIFFERENTIAL/PLATELET
Absolute Lymphocytes: 1242 {cells}/uL (ref 850–3900)
Absolute Monocytes: 607 {cells}/uL (ref 200–950)
Basophils Absolute: 90 {cells}/uL (ref 0–200)
Basophils Relative: 1.3 %
Eosinophils Absolute: 607 {cells}/uL — ABNORMAL HIGH (ref 15–500)
Eosinophils Relative: 8.8 %
HCT: 35.6 % (ref 35.0–45.0)
Hemoglobin: 11.4 g/dL — ABNORMAL LOW (ref 11.7–15.5)
MCH: 28.8 pg (ref 27.0–33.0)
MCHC: 32 g/dL (ref 32.0–36.0)
MCV: 89.9 fL (ref 80.0–100.0)
MPV: 11.4 fL (ref 7.5–12.5)
Monocytes Relative: 8.8 %
Neutro Abs: 4354 {cells}/uL (ref 1500–7800)
Neutrophils Relative %: 63.1 %
Platelets: 264 Thousand/uL (ref 140–400)
RBC: 3.96 Million/uL (ref 3.80–5.10)
RDW: 13.5 % (ref 11.0–15.0)
Total Lymphocyte: 18 %
WBC: 6.9 Thousand/uL (ref 3.8–10.8)

## 2024-02-17 LAB — IRON,TIBC AND FERRITIN PANEL
%SAT: 24 % (ref 16–45)
Ferritin: 28 ng/mL (ref 16–288)
Iron: 83 ug/dL (ref 45–160)
TIBC: 346 ug/dL (ref 250–450)

## 2024-02-18 ENCOUNTER — Other Ambulatory Visit: Payer: Self-pay | Admitting: Family Medicine

## 2024-02-18 DIAGNOSIS — I10 Essential (primary) hypertension: Secondary | ICD-10-CM

## 2024-03-09 DIAGNOSIS — M19072 Primary osteoarthritis, left ankle and foot: Secondary | ICD-10-CM | POA: Diagnosis not present

## 2024-03-12 ENCOUNTER — Encounter: Payer: Self-pay | Admitting: Radiology

## 2024-03-21 DIAGNOSIS — F411 Generalized anxiety disorder: Secondary | ICD-10-CM | POA: Diagnosis not present

## 2024-03-21 DIAGNOSIS — I700 Atherosclerosis of aorta: Secondary | ICD-10-CM | POA: Diagnosis not present

## 2024-03-21 DIAGNOSIS — Z7982 Long term (current) use of aspirin: Secondary | ICD-10-CM | POA: Diagnosis not present

## 2024-03-21 DIAGNOSIS — I129 Hypertensive chronic kidney disease with stage 1 through stage 4 chronic kidney disease, or unspecified chronic kidney disease: Secondary | ICD-10-CM | POA: Diagnosis not present

## 2024-03-21 DIAGNOSIS — M1990 Unspecified osteoarthritis, unspecified site: Secondary | ICD-10-CM | POA: Diagnosis not present

## 2024-03-21 DIAGNOSIS — N1831 Chronic kidney disease, stage 3a: Secondary | ICD-10-CM | POA: Diagnosis not present

## 2024-03-21 DIAGNOSIS — E785 Hyperlipidemia, unspecified: Secondary | ICD-10-CM | POA: Diagnosis not present

## 2024-03-21 DIAGNOSIS — F324 Major depressive disorder, single episode, in partial remission: Secondary | ICD-10-CM | POA: Diagnosis not present

## 2024-03-21 DIAGNOSIS — I2510 Atherosclerotic heart disease of native coronary artery without angina pectoris: Secondary | ICD-10-CM | POA: Diagnosis not present

## 2024-04-17 DIAGNOSIS — D2271 Melanocytic nevi of right lower limb, including hip: Secondary | ICD-10-CM | POA: Diagnosis not present

## 2024-04-17 DIAGNOSIS — L720 Epidermal cyst: Secondary | ICD-10-CM | POA: Diagnosis not present

## 2024-04-17 DIAGNOSIS — L821 Other seborrheic keratosis: Secondary | ICD-10-CM | POA: Diagnosis not present

## 2024-04-17 DIAGNOSIS — D225 Melanocytic nevi of trunk: Secondary | ICD-10-CM | POA: Diagnosis not present

## 2024-04-17 DIAGNOSIS — L812 Freckles: Secondary | ICD-10-CM | POA: Diagnosis not present

## 2024-04-17 DIAGNOSIS — D2272 Melanocytic nevi of left lower limb, including hip: Secondary | ICD-10-CM | POA: Diagnosis not present

## 2024-04-17 DIAGNOSIS — D1801 Hemangioma of skin and subcutaneous tissue: Secondary | ICD-10-CM | POA: Diagnosis not present

## 2024-04-19 ENCOUNTER — Encounter (HOSPITAL_BASED_OUTPATIENT_CLINIC_OR_DEPARTMENT_OTHER): Payer: Self-pay | Admitting: Cardiovascular Disease

## 2024-04-27 ENCOUNTER — Other Ambulatory Visit: Payer: Self-pay | Admitting: Family Medicine

## 2024-05-07 ENCOUNTER — Ambulatory Visit: Admitting: Family Medicine

## 2024-05-07 ENCOUNTER — Other Ambulatory Visit (HOSPITAL_COMMUNITY): Payer: Self-pay

## 2024-05-07 ENCOUNTER — Telehealth: Payer: Self-pay | Admitting: Pharmacy Technician

## 2024-05-07 ENCOUNTER — Encounter: Payer: Self-pay | Admitting: Family Medicine

## 2024-05-07 VITALS — BP 120/64 | HR 70 | Temp 98.7°F | Ht 63.0 in | Wt 187.4 lb

## 2024-05-07 DIAGNOSIS — E039 Hypothyroidism, unspecified: Secondary | ICD-10-CM

## 2024-05-07 DIAGNOSIS — Z6833 Body mass index (BMI) 33.0-33.9, adult: Secondary | ICD-10-CM | POA: Diagnosis not present

## 2024-05-07 DIAGNOSIS — K921 Melena: Secondary | ICD-10-CM | POA: Diagnosis not present

## 2024-05-07 DIAGNOSIS — G4733 Obstructive sleep apnea (adult) (pediatric): Secondary | ICD-10-CM | POA: Diagnosis not present

## 2024-05-07 DIAGNOSIS — I10 Essential (primary) hypertension: Secondary | ICD-10-CM | POA: Diagnosis not present

## 2024-05-07 MED ORDER — ZEPBOUND 2.5 MG/0.5ML ~~LOC~~ SOAJ: 2.5000 mg | SUBCUTANEOUS | 3 refills | Status: AC

## 2024-05-07 NOTE — Progress Notes (Signed)
 "  Subjective:    Patient ID: Jean Baldwin, female    DOB: 12/30/1950, 73 y.o.   MRN: [id number]  HPI  Patient has a history of very hard to control blood pressure however her blood pressure today is excellent at 120/64.  She is here today to recheck her thyroid .  At her last visit in September her TSH was 0.3.  We reduced her levothyroxine  to alternating 100 mcg daily with 88 mcg daily.  She is doing that now.  She is here today to recheck her TSH.  Unfortunately she complains of severe knee pain.  She has a history of sleep apnea.  She also has a history of hypertension as well as a BMI of 33.  Medicare should pay for Zepbound due to her history of moderate sleep apnea with an apnea-hypopnea index greater than 15.  She has tried and failed diet and exercise.  Her knee pain is likely due to osteoarthritis that would definitely benefit from taking a GLP-1 to assist in weight loss. Past Medical History:  Diagnosis Date   Allergy    Arthritis    knees   Cataract    Dental crowns present    High cholesterol    Hypertension    states under control with meds., has been on med > 20 yr.   Hypothyroidism    Osteopenia    Sleep apnea    TFCC (triangular fibrocartilage complex) tear 03/2017   right wrist/wrist   Past Surgical History:  Procedure Laterality Date   CESAREAN SECTION     JOINT REPLACEMENT     KNEE ARTHROSCOPY W/ MENISCAL REPAIR Left    NASAL SEPTUM SURGERY     TOTAL HIP ARTHROPLASTY Right 07/12/2017   Procedure: RIGHT TOTAL HIP ARTHROPLASTY ANTERIOR APPROACH;  Surgeon: Vernetta Lonni GRADE, MD;  Location: [facility] OR;  Service: Orthopedics;  Laterality: Right;   TUBAL LIGATION     WRIST ARTHROSCOPY WITH DEBRIDEMENT Right 04/12/2017   Procedure: RIGHT WRIST ARTHROSCOPY WITH DEBRIDEMENT;  Surgeon: Murrell Kuba, MD;  Location: [facility];  Service: Orthopedics;  Laterality: Right;   Current Outpatient Medications on File Prior to Visit  Medication Sig Dispense Refill    aspirin  81 MG chewable tablet Chew 1 tablet (81 mg total) by mouth 2 (two) times daily. (Patient taking differently: Chew 81 mg by mouth daily.) 35 tablet 0   atorvastatin  (LIPITOR) 20 MG tablet TAKE 1 TABLET BY MOUTH EVERY DAY 90 tablet 1   Calcium  Carb-Cholecalciferol (CALCIUM /VITAMIN D) 600-400 MG-UNIT TABS Take 2 tablets by mouth daily.     cetirizine (ZYRTEC) 10 MG tablet Take 10 mg by mouth daily.     cloNIDine  (CATAPRES  - DOSED IN MG/24 HR) 0.2 mg/24hr patch Place 1 patch (0.2 mg total) onto the skin once a week. 12 patch 3   escitalopram  (LEXAPRO ) 10 MG tablet TAKE 1 TABLET(10 MG) BY MOUTH DAILY 30 tablet 3   hydrochlorothiazide  (HYDRODIURIL ) 25 MG tablet TAKE 1 TABLET(25 MG) BY MOUTH DAILY 30 tablet 3   levothyroxine  (SYNTHROID ) 100 MCG tablet Take 1 tablet (100 mcg total) by mouth every other day. Alternate with 88 mcg. 45 tablet 1   levothyroxine  (SYNTHROID ) 88 MCG tablet Take 1 tablet (88 mcg total) by mouth every other day. Alternate with 100 mcg. 45 tablet 1   meloxicam (MOBIC) 15 MG tablet TAKE 1 TABLET BY MOUTH EVERY DAY AS NEEDED FOR JOINT PAIN 90 tablet 2   nebivolol  (BYSTOLIC ) 10 MG tablet Take 1 tablet (  10 mg total) by mouth daily. 90 tablet 3   Omega-3 Fatty Acids (FISH OIL) 500 MG CAPS Take by mouth.     TALICIA 250-12.5-10 MG CPDR 4 capsules Orally every 8 hrs; Duration: 14 day(s)     valsartan  (DIOVAN ) 320 MG tablet Take 1 tablet (320 mg total) by mouth daily. 90 tablet 3   No current facility-administered medications on file prior to visit.   Allergies  Allergen Reactions   Sulfamethoxazole Other (See Comments)   Codeine Nausea And Vomiting   Sulfa Antibiotics Rash    Social History   Socioeconomic History   Marital status: Married    Spouse name: Not on file   Number of children: 2   Years of education: Not on file   Highest education level: Never attended school  Occupational History   Not on file  Tobacco Use   Smoking status: Never   Smokeless  tobacco: Never  Vaping Use   Vaping status: Never Used  Substance and Sexual Activity   Alcohol use: No   Drug use: No   Sexual activity: Yes  Other Topics Concern   Not on file  Social History Narrative   Not on file   Social Drivers of Health   Tobacco Use: Low Risk (05/07/2024)   Patient History    Smoking Tobacco Use: Never    Smokeless Tobacco Use: Never    Passive Exposure: Not on file  Financial Resource Strain: Low Risk (10/28/2023)   Overall Financial Resource Strain (CARDIA)    Difficulty of Paying Living Expenses: Not hard at all  Food Insecurity: No Food Insecurity (10/28/2023)   Epic    Worried About Radiation Protection Practitioner of Food in the Last Year: Never true    Ran Out of Food in the Last Year: Never true  Transportation Needs: No Transportation Needs (10/28/2023)   Epic    Lack of Transportation (Medical): No    Lack of Transportation (Non-Medical): No  Physical Activity: Insufficiently Active (10/28/2023)   Exercise Vital Sign    Days of Exercise per Week: 2 days    Minutes of Exercise per Session: 20 min  Stress: No Stress Concern Present (10/28/2023)   [employer] of Occupational Health - Occupational Stress Questionnaire    Feeling of Stress: Not at all  Social Connections: Socially Integrated (10/28/2023)   Social Connection and Isolation Panel    Frequency of Communication with Friends and Family: More than three times a week    Frequency of Social Gatherings with Friends and Family: More than three times a week    Attends Religious Services: More than 4 times per year    Active Member of [employer] or Organizations: Yes    Attends Banker Meetings: More than 4 times per year    Marital Status: Married  Catering Manager Violence: Not on file  Depression (PHQ2-9): Low Risk (11/01/2023)   Depression (PHQ2-9)    PHQ-2 Score: 1  Alcohol Screen: Not on file  Housing: Low Risk (10/28/2023)   Epic    Unable to Pay for Housing in the Last Year: No     Number of Times Moved in the Last Year: 0    Homeless in the Last Year: No  Utilities: Not on file  Health Literacy: Not on file     Review of Systems  All other systems reviewed and are negative.      Objective:   Physical Exam Constitutional:      Appearance: Normal appearance. She  is normal weight.  Cardiovascular:     Rate and Rhythm: Normal rate and regular rhythm.     Pulses: Normal pulses.     Heart sounds: Normal heart sounds. No murmur heard.    No gallop.  Pulmonary:     Effort: Pulmonary effort is normal. No respiratory distress.     Breath sounds: Normal breath sounds. No stridor. No wheezing, rhonchi or rales.  Abdominal:     General: Bowel sounds are normal.     Palpations: Abdomen is soft.  Musculoskeletal:     Right lower leg: No edema.     Left lower leg: No edema.  Neurological:     General: No focal deficit present.     Mental Status: She is alert and oriented to person, place, and time.           Assessment & Plan:  Hypothyroidism, unspecified type - Plan: CBC with Differential/Platelet, Comprehensive metabolic panel with GFR, TSH  BMI 33.0-33.9,adult  Benign essential HTN  Moderate obstructive sleep apnea Recheck TSH today and titrate levothyroxine  to achieve a TSH within therapeutic range.  I am very happy with her blood pressure today but I encouraged her to continue to monitor this at home and let me know how it is doing.  Like to keep her systolic blood pressure around 140 I am very happy with that.  The patient has a history of moderate obstructive sleep apnea with an apnea-hypopnea index greater than 15.  Her BMI is 33.  I believe that she qualifies for Zepbound to assist in weight loss.  This should help treat her obstructive sleep apnea.  Begin Zepbound 2.5 mg subcu weekly and uptitrate as tolerated "

## 2024-05-07 NOTE — Telephone Encounter (Signed)
 Pharmacy Patient Advocate Encounter   Received notification from Onbase that prior authorization for Zepbound 2.5MG /0.5ML pen-injectors  is required/requested.   Insurance verification completed.   The patient is insured through [employer].   Per test claim: PA required; PA started via CoverMyMeds. KEY [id number] . Waiting for clinical questions to populate.

## 2024-05-07 NOTE — Telephone Encounter (Signed)
 Pharmacy Patient Advocate Encounter  Received notification from [employer] that Prior Authorization for Zepbound 2.5MG /0.5ML pen-injectors has been APPROVED from 05/11/23 to 05/09/25. Ran test claim, Copay is $64.00. This test claim was processed through [facility]- copay amounts may vary at other pharmacies due to pharmacy/plan contracts, or as the patient moves through the different stages of their insurance plan.   PA #/Case ID/Reference #: [id number]

## 2024-05-08 ENCOUNTER — Other Ambulatory Visit

## 2024-05-08 ENCOUNTER — Ambulatory Visit: Payer: Self-pay | Admitting: Family Medicine

## 2024-05-08 ENCOUNTER — Other Ambulatory Visit: Payer: Self-pay

## 2024-05-08 DIAGNOSIS — D649 Anemia, unspecified: Secondary | ICD-10-CM

## 2024-05-08 LAB — COMPREHENSIVE METABOLIC PANEL WITH GFR
AG Ratio: 2 (calc) (ref 1.0–2.5)
ALT: 18 U/L (ref 6–29)
AST: 22 U/L (ref 10–35)
Albumin: 3.9 g/dL (ref 3.6–5.1)
Alkaline phosphatase (APISO): 105 U/L (ref 37–153)
BUN/Creatinine Ratio: 23 (calc) — ABNORMAL HIGH (ref 6–22)
BUN: 24 mg/dL (ref 7–25)
CO2: 28 mmol/L (ref 20–32)
Calcium: 9.6 mg/dL (ref 8.6–10.4)
Chloride: 107 mmol/L (ref 98–110)
Creat: 1.03 mg/dL — ABNORMAL HIGH (ref 0.60–1.00)
Globulin: 2 g/dL (ref 1.9–3.7)
Glucose, Bld: 120 mg/dL — ABNORMAL HIGH (ref 65–99)
Potassium: 4.6 mmol/L (ref 3.5–5.3)
Sodium: 143 mmol/L (ref 135–146)
Total Bilirubin: 0.4 mg/dL (ref 0.2–1.2)
Total Protein: 5.9 g/dL — ABNORMAL LOW (ref 6.1–8.1)
eGFR: 57 mL/min/1.73m2 — ABNORMAL LOW

## 2024-05-08 LAB — CBC WITH DIFFERENTIAL/PLATELET
Absolute Lymphocytes: 1280 {cells}/uL (ref 850–3900)
Absolute Monocytes: 680 {cells}/uL (ref 200–950)
Basophils Absolute: 97 {cells}/uL (ref 0–200)
Basophils Relative: 1.2 %
Eosinophils Absolute: 1077 {cells}/uL — ABNORMAL HIGH (ref 15–500)
Eosinophils Relative: 13.3 %
HCT: 34.1 % — ABNORMAL LOW (ref 35.9–46.0)
Hemoglobin: 10.7 g/dL — ABNORMAL LOW (ref 11.7–15.5)
MCH: 28.2 pg (ref 27.0–33.0)
MCHC: 31.4 g/dL — ABNORMAL LOW (ref 31.6–35.4)
MCV: 89.7 fL (ref 81.4–101.7)
MPV: 11.5 fL (ref 7.5–12.5)
Monocytes Relative: 8.4 %
Neutro Abs: 4965 {cells}/uL (ref 1500–7800)
Neutrophils Relative %: 61.3 %
Platelets: 257 Thousand/uL (ref 140–400)
RBC: 3.8 Million/uL (ref 3.80–5.10)
RDW: 13.7 % (ref 11.0–15.0)
Total Lymphocyte: 15.8 %
WBC: 8.1 Thousand/uL (ref 3.8–10.8)

## 2024-05-08 LAB — TSH: TSH: 2.48 m[IU]/L (ref 0.40–4.50)

## 2024-05-08 NOTE — Addendum Note (Signed)
 Addended by: ANGELENA RONAL BRADLEY K on: 05/08/2024 10:58 AM   Modules accepted: Orders

## 2024-05-11 ENCOUNTER — Other Ambulatory Visit: Payer: Self-pay | Admitting: Family Medicine

## 2024-05-21 LAB — FECAL GLOBIN BY IMMUNOCHEMISTRY
FECAL GLOBIN RESULT:: NOT DETECTED
MICRO NUMBER:: 17448898
SPECIMEN QUALITY:: ADEQUATE

## 2024-05-21 LAB — HOUSE ACCOUNT TRACKING

## 2024-05-24 ENCOUNTER — Ambulatory Visit: Payer: Self-pay | Admitting: Family Medicine

## 2024-05-25 ENCOUNTER — Encounter: Payer: Self-pay | Admitting: Family Medicine

## 2024-06-08 ENCOUNTER — Other Ambulatory Visit: Payer: Self-pay | Admitting: Family Medicine

## 2024-06-08 DIAGNOSIS — I10 Essential (primary) hypertension: Secondary | ICD-10-CM

## 2024-06-14 ENCOUNTER — Ambulatory Visit

## 2024-06-14 VITALS — Ht 63.0 in | Wt 187.0 lb

## 2024-06-14 DIAGNOSIS — Z0000 Encounter for general adult medical examination without abnormal findings: Secondary | ICD-10-CM

## 2024-06-14 NOTE — Progress Notes (Signed)
 "  Chief Complaint  Patient presents with   Medicare Wellness     Subjective:   Jean Baldwin is a 74 y.o. female who presents for a Medicare Annual Wellness Visit.  Visit info / Clinical Intake: Medicare Wellness Visit Type:: Subsequent Annual Wellness Visit Persons participating in visit and providing information:: patient Medicare Wellness Visit Mode:: Video Since this visit was completed virtually, some vitals may be partially provided or unavailable. Missing vitals are due to the limitations of the virtual format.: Documented vitals are patient reported If Telephone or Video please confirm:: I connected with patient using audio/video enable telemedicine. I verified patient identity with two identifiers, discussed telehealth limitations, and patient agreed to proceed. Patient Location:: home Provider Location:: office Interpreter Needed?: No Pre-visit prep was completed: yes AWV questionnaire completed by patient prior to visit?: no Living arrangements:: lives with spouse/significant other Patient's Overall Health Status Rating: very good Typical amount of pain: some Does pain affect daily life?: no Are you currently prescribed opioids?: no  Dietary Habits and Nutritional Risks How many meals a day?: 3 Eats fruit and vegetables daily?: yes Most meals are obtained by: preparing own meals In the last 2 weeks, have you had any of the following?: none Diabetic:: no  Functional Status Activities of Daily Living (to include ambulation/medication): Independent Ambulation: Independent Medication Administration: Independent Home Management (perform basic housework or laundry): Independent Manage your own finances?: yes Primary transportation is: driving Concerns about vision?: no *vision screening is required for WTM* Concerns about hearing?: no  Fall Screening Falls in the past year?: 0 Number of falls in past year: 0 Was there an injury with Fall?: 0 Fall Risk Category  Calculator: 0 Patient Fall Risk Level: Low Fall Risk  Fall Risk Patient at Risk for Falls Due to: No Fall Risks Fall risk Follow up: Falls prevention discussed; Education provided; Falls evaluation completed  Home and Transportation Safety: All rugs have non-skid backing?: yes All stairs or steps have railings?: yes Grab bars in the bathtub or shower?: yes Have non-skid surface in bathtub or shower?: yes Good home lighting?: yes Regular seat belt use?: yes Hospital stays in the last year:: no  Cognitive Assessment Difficulty concentrating, remembering, or making decisions? : no Will 6CIT or Mini Cog be Completed: no 6CIT or Mini Cog Declined: patient alert, oriented, able to answer questions appropriately and recall recent events  Advance Directives (For Healthcare) Does Patient Have a Medical Advance Directive?: Yes Does patient want to make changes to medical advance directive?: Yes (MAU/Ambulatory/Procedural Areas - Information given) Type of Advance Directive: Living will Copy of Living Will in Chart?: Yes - validated most recent copy scanned in chart (See row information)  Reviewed/Updated  Reviewed/Updated: Reviewed All (Medical, Surgical, Family, Medications, Allergies, Care Teams, Patient Goals)    Allergies (verified) Sulfamethoxazole, Codeine, and Sulfa antibiotics   Current Medications (verified) Outpatient Encounter Medications as of 06/14/2024  Medication Sig   aspirin  81 MG chewable tablet Chew 1 tablet (81 mg total) by mouth 2 (two) times daily. (Patient taking differently: Chew 81 mg by mouth daily.)   atorvastatin  (LIPITOR) 20 MG tablet TAKE 1 TABLET BY MOUTH EVERY DAY   Calcium  Carb-Cholecalciferol (CALCIUM /VITAMIN D) 600-400 MG-UNIT TABS Take 2 tablets by mouth daily.   cetirizine (ZYRTEC) 10 MG tablet Take 10 mg by mouth daily.   cloNIDine  (CATAPRES  - DOSED IN MG/24 HR) 0.2 mg/24hr patch Place 1 patch (0.2 mg total) onto the skin once a week.    escitalopram  (LEXAPRO )  10 MG tablet TAKE 1 TABLET(10 MG) BY MOUTH DAILY   hydrochlorothiazide  (HYDRODIURIL ) 25 MG tablet TAKE 1 TABLET(25 MG) BY MOUTH DAILY   levothyroxine  (SYNTHROID ) 100 MCG tablet Take 1 tablet (100 mcg total) by mouth every other day. Alternate with 88 mcg.   levothyroxine  (SYNTHROID ) 88 MCG tablet Take 1 tablet (88 mcg total) by mouth every other day. Alternate with 100 mcg.   meloxicam (MOBIC) 15 MG tablet TAKE 1 TABLET BY MOUTH EVERY DAY AS NEEDED FOR JOINT PAIN   nebivolol  (BYSTOLIC ) 10 MG tablet Take 1 tablet (10 mg total) by mouth daily.   Omega-3 Fatty Acids (FISH OIL) 500 MG CAPS Take by mouth.   TALICIA 250-12.5-10 MG CPDR 4 capsules Orally every 8 hrs; Duration: 14 day(s)   tirzepatide  (ZEPBOUND ) 2.5 MG/0.5ML Pen Inject 2.5 mg into the skin once a week. This is for moderate sleep apnea   valsartan  (DIOVAN ) 320 MG tablet Take 1 tablet (320 mg total) by mouth daily.   No facility-administered encounter medications on file as of 06/14/2024.    History: Past Medical History:  Diagnosis Date   Allergy    Arthritis    knees   Cataract    Dental crowns present    High cholesterol    Hypertension    states under control with meds., has been on med > 20 yr.   Hypothyroidism    Osteopenia    Sleep apnea    TFCC (triangular fibrocartilage complex) tear 03/2017   right wrist/wrist   Past Surgical History:  Procedure Laterality Date   CESAREAN SECTION     JOINT REPLACEMENT     KNEE ARTHROSCOPY W/ MENISCAL REPAIR Left    NASAL SEPTUM SURGERY     TOTAL HIP ARTHROPLASTY Right 07/12/2017   Procedure: RIGHT TOTAL HIP ARTHROPLASTY ANTERIOR APPROACH;  Surgeon: Vernetta Lonni GRADE, MD;  Location: [facility] OR;  Service: Orthopedics;  Laterality: Right;   TUBAL LIGATION     WRIST ARTHROSCOPY WITH DEBRIDEMENT Right 04/12/2017   Procedure: RIGHT WRIST ARTHROSCOPY WITH DEBRIDEMENT;  Surgeon: Murrell Kuba, MD;  Location: [facility];  Service: Orthopedics;   Laterality: Right;   Family History  Problem Relation Age of Onset   Emphysema Father    Heart disease Father    Other Father        Pacemaker   Heart disease Maternal Grandfather        fatal MI   Breast cancer Maternal Aunt    Social History   Occupational History   Not on file  Tobacco Use   Smoking status: Never   Smokeless tobacco: Never  Vaping Use   Vaping status: Never Used  Substance and Sexual Activity   Alcohol use: No   Drug use: No   Sexual activity: Yes   Tobacco Counseling Counseling given: Not Answered  SDOH Screenings   Food Insecurity: No Food Insecurity (06/14/2024)  Housing: Low Risk (06/14/2024)  Transportation Needs: No Transportation Needs (06/14/2024)  Utilities: Not At Risk (06/14/2024)  Depression (PHQ2-9): Low Risk (06/14/2024)  Financial Resource Strain: Low Risk (10/28/2023)  Physical Activity: Insufficiently Active (06/14/2024)  Social Connections: Socially Integrated (06/14/2024)  Stress: No Stress Concern Present (06/14/2024)  Tobacco Use: Low Risk (06/14/2024)  Health Literacy: Adequate Health Literacy (06/14/2024)   See flowsheets for full screening details  Depression Screen PHQ 2 & 9 Depression Scale- Over the past 2 weeks, how often have you been bothered by any of the following problems? Little interest or pleasure in doing  things: 0 Feeling down, depressed, or hopeless (PHQ Adolescent also includes...irritable): 0 PHQ-2 Total Score: 0 Trouble falling or staying asleep, or sleeping too much: 0 Feeling tired or having little energy: 1 Poor appetite or overeating (PHQ Adolescent also includes...weight loss): 0 Feeling bad about yourself - or that you are a failure or have let yourself or your family down: 0 Trouble concentrating on things, such as reading the newspaper or watching television (PHQ Adolescent also includes...like school work): 0 Moving or speaking so slowly that other people could have noticed. Or the opposite - being so fidgety or  restless that you have been moving around a lot more than usual: 0 Thoughts that you would be better off dead, or of hurting yourself in some way: 0 PHQ-9 Total Score: 1 If you checked off any problems, how difficult have these problems made it for you to do your work, take care of things at home, or get along with other people?: Not difficult at all  Depression Treatment Depression Interventions/Treatment : EYV7-0 Score <4 Follow-up Not Indicated     Goals Addressed             This Visit's Progress    Maintain health and independence   On track            Objective:    Today's Vitals   06/14/24 1524  Weight: 187 lb (84.8 kg)  Height: 5' 3 (1.6 m)   Body mass index is 33.13 kg/m.  Hearing/Vision screen No results found. Immunizations and Health Maintenance Health Maintenance  Topic Date Due   COVID-19 Vaccine (6 - 2025-26 season) 01/09/2024   Hepatitis C Screening  02/15/2025 (Originally 11/13/1968)   Medicare Annual Wellness (AWV)  06/14/2025   Mammogram  08/29/2025   Colonoscopy  08/15/2033   Pneumococcal Vaccine: 50+ Years  Completed   Influenza Vaccine  Completed   Bone Density Scan  Completed   Zoster Vaccines- Shingrix  Completed   Meningococcal B Vaccine  Aged Out   DTaP/Tdap/Td  Discontinued        Assessment/Plan:  This is a routine wellness examination for [facility].  Patient Care Team: Duanne Butler DASEN, MD as PCP - General (Family Medicine) Vannie Reche RAMAN, NP as Nurse Practitioner (Cardiology) Cleotilde Sewer, OD (Optometry) Joshua Blamer, MD as Attending Physician (Dermatology) Ferdie Nest, PA-C as Physician Assistant (Family Medicine) Elsa Lonni SAUNDERS, MD as Consulting Physician (Orthopedic Surgery)  I have personally reviewed and noted the following in the patients chart:   Medical and social history Use of alcohol, tobacco or illicit drugs  Current medications and supplements including opioid prescriptions. Functional ability and  status Nutritional status Physical activity Advanced directives List of other physicians Hospitalizations, surgeries, and ER visits in previous 12 months Vitals Screenings to include cognitive, depression, and falls Referrals and appointments  No orders of the defined types were placed in this encounter.  In addition, I have reviewed and discussed with patient certain preventive protocols, quality metrics, and best practice recommendations. A written personalized care plan for preventive services as well as general preventive health recommendations were provided to patient.   Lavelle Charmaine Browner, LPN   11/11/7971   Return in 1 year (on 06/14/2025).  After Visit Summary: (MyChart) Due to this being a telephonic visit, the after visit summary with patients personalized plan was offered to patient via MyChart   Nurse Notes: No voiced or noted concerns at this time Patient advised to keep follow-up appointment with PCP (07/06/24)  "

## 2024-06-14 NOTE — Patient Instructions (Signed)
 Jean Baldwin,  Thank you for taking the time for your Medicare Wellness Visit. I appreciate your continued commitment to your health goals. Please review the care plan we discussed, and feel free to reach out if I can assist you further.  Please note that Annual Wellness Visits do not include a physical exam. Some assessments may be limited, especially if the visit was conducted virtually. If needed, we may recommend an in-person follow-up with your provider.  Ongoing Care Seeing your primary care provider every 3 to 6 months helps us  monitor your health and provide consistent, personalized care.   Referrals If a referral was made during today's visit and you haven't received any updates within two weeks, please contact the referred provider directly to check on the status.  Recommended Screenings:  Health Maintenance  Topic Date Due   COVID-19 Vaccine (6 - 2025-26 season) 01/09/2024   Hepatitis C Screening  02/15/2025*   Medicare Annual Wellness Visit  06/14/2025   Breast Cancer Screening  08/29/2025   Colon Cancer Screening  08/15/2033   Pneumococcal Vaccine for age over 59  Completed   Flu Shot  Completed   Osteoporosis screening with Bone Density Scan  Completed   Zoster (Shingles) Vaccine  Completed   Meningitis B Vaccine  Aged Out   DTaP/Tdap/Td vaccine  Discontinued  *Topic was postponed. The date shown is not the original due date.       06/14/2024    3:30 PM  Advanced Directives  Does Patient Have a Medical Advance Directive? Yes  Type of Advance Directive Living will  Does patient want to make changes to medical advance directive? Yes (MAU/Ambulatory/Procedural Areas - Information given)   Information on Advanced Care Planning can be found at [place]  Secretary of [facility] Advance Health Care Directives Advance Health Care Directives ([email or web address]   Vision: Annual vision screenings are recommended for early detection of glaucoma, cataracts, and diabetic retinopathy.  These exams can also reveal signs of chronic conditions such as diabetes and high blood pressure.  Dental: Annual dental screenings help detect early signs of oral cancer, gum disease, and other conditions linked to overall health, including heart disease and diabetes.  Please see the attached documents for additional preventive care recommendations.

## 2024-07-03 ENCOUNTER — Other Ambulatory Visit

## 2024-07-06 ENCOUNTER — Encounter: Admitting: Family Medicine

## 2025-01-25 ENCOUNTER — Ambulatory Visit: Admitting: Urology
